# Patient Record
Sex: Male | Born: 1955 | Race: White | Hispanic: No | Smoking: Former smoker
Health system: Southern US, Community
[De-identification: ages and names within clinical notes are randomized; demographics above are authoritative.]

## PROBLEM LIST (undated history)

## (undated) DIAGNOSIS — R972 Elevated prostate specific antigen [PSA]: Secondary | ICD-10-CM

## (undated) DIAGNOSIS — Z9889 Other specified postprocedural states: Secondary | ICD-10-CM

## (undated) DIAGNOSIS — I219 Acute myocardial infarction, unspecified: Secondary | ICD-10-CM

## (undated) DIAGNOSIS — Z87442 Personal history of urinary calculi: Secondary | ICD-10-CM

## (undated) DIAGNOSIS — D72829 Elevated white blood cell count, unspecified: Secondary | ICD-10-CM

## (undated) DIAGNOSIS — F411 Generalized anxiety disorder: Secondary | ICD-10-CM

## (undated) DIAGNOSIS — E291 Testicular hypofunction: Secondary | ICD-10-CM

## (undated) DIAGNOSIS — M1990 Unspecified osteoarthritis, unspecified site: Secondary | ICD-10-CM

## (undated) DIAGNOSIS — T8859XA Other complications of anesthesia, initial encounter: Secondary | ICD-10-CM

## (undated) DIAGNOSIS — F329 Major depressive disorder, single episode, unspecified: Secondary | ICD-10-CM

## (undated) DIAGNOSIS — J189 Pneumonia, unspecified organism: Secondary | ICD-10-CM

## (undated) DIAGNOSIS — I319 Disease of pericardium, unspecified: Secondary | ICD-10-CM

## (undated) DIAGNOSIS — N401 Enlarged prostate with lower urinary tract symptoms: Secondary | ICD-10-CM

## (undated) DIAGNOSIS — C775 Secondary and unspecified malignant neoplasm of intrapelvic lymph nodes: Secondary | ICD-10-CM

## (undated) DIAGNOSIS — T4145XA Adverse effect of unspecified anesthetic, initial encounter: Secondary | ICD-10-CM

## (undated) DIAGNOSIS — I10 Essential (primary) hypertension: Secondary | ICD-10-CM

## (undated) DIAGNOSIS — I2510 Atherosclerotic heart disease of native coronary artery without angina pectoris: Secondary | ICD-10-CM

## (undated) DIAGNOSIS — E785 Hyperlipidemia, unspecified: Secondary | ICD-10-CM

## (undated) DIAGNOSIS — R351 Nocturia: Secondary | ICD-10-CM

## (undated) DIAGNOSIS — N486 Induration penis plastica: Secondary | ICD-10-CM

## (undated) DIAGNOSIS — H8110 Benign paroxysmal vertigo, unspecified ear: Secondary | ICD-10-CM

## (undated) DIAGNOSIS — R42 Dizziness and giddiness: Secondary | ICD-10-CM

## (undated) DIAGNOSIS — N400 Enlarged prostate without lower urinary tract symptoms: Secondary | ICD-10-CM

## (undated) DIAGNOSIS — G629 Polyneuropathy, unspecified: Secondary | ICD-10-CM

## (undated) DIAGNOSIS — N403 Nodular prostate with lower urinary tract symptoms: Secondary | ICD-10-CM

## (undated) DIAGNOSIS — F419 Anxiety disorder, unspecified: Secondary | ICD-10-CM

## (undated) DIAGNOSIS — G709 Myoneural disorder, unspecified: Secondary | ICD-10-CM

## (undated) DIAGNOSIS — Z8489 Family history of other specified conditions: Secondary | ICD-10-CM

## (undated) DIAGNOSIS — N529 Male erectile dysfunction, unspecified: Secondary | ICD-10-CM

## (undated) DIAGNOSIS — R112 Nausea with vomiting, unspecified: Secondary | ICD-10-CM

## (undated) DIAGNOSIS — E119 Type 2 diabetes mellitus without complications: Secondary | ICD-10-CM

## (undated) HISTORY — PX: PROSTATE BIOPSY: SHX241

## (undated) HISTORY — DX: Disease of pericardium, unspecified: I31.9

## (undated) HISTORY — DX: Essential (primary) hypertension: I10

## (undated) HISTORY — DX: Benign prostatic hyperplasia without lower urinary tract symptoms: N40.0

## (undated) HISTORY — PX: CARPAL TUNNEL RELEASE: SHX101

## (undated) HISTORY — PX: JOINT REPLACEMENT: SHX530

## (undated) HISTORY — DX: Testicular hypofunction: E29.1

## (undated) HISTORY — PX: BACK SURGERY: SHX140

## (undated) HISTORY — DX: Elevated white blood cell count, unspecified: D72.829

## (undated) HISTORY — DX: Elevated prostate specific antigen (PSA): R97.20

## (undated) HISTORY — PX: SHOULDER ARTHROSCOPY DISTAL CLAVICLE EXCISION AND OPEN ROTATOR CUFF REPAIR: SHX2396

---

## 1989-01-20 HISTORY — PX: CARPAL TUNNEL RELEASE: SHX101

## 1998-01-08 ENCOUNTER — Ambulatory Visit (HOSPITAL_BASED_OUTPATIENT_CLINIC_OR_DEPARTMENT_OTHER): Admission: RE | Admit: 1998-01-08 | Discharge: 1998-01-08 | Payer: Self-pay | Admitting: *Deleted

## 2000-01-21 HISTORY — PX: SHOULDER ARTHROSCOPY W/ ROTATOR CUFF REPAIR: SHX2400

## 2000-07-17 ENCOUNTER — Ambulatory Visit (HOSPITAL_COMMUNITY): Admission: RE | Admit: 2000-07-17 | Discharge: 2000-07-17 | Payer: Self-pay | Admitting: Specialist

## 2000-07-17 ENCOUNTER — Encounter: Payer: Self-pay | Admitting: Specialist

## 2003-01-10 ENCOUNTER — Encounter (INDEPENDENT_AMBULATORY_CARE_PROVIDER_SITE_OTHER): Payer: Self-pay | Admitting: Cardiology

## 2003-01-10 ENCOUNTER — Inpatient Hospital Stay (HOSPITAL_COMMUNITY): Admission: EM | Admit: 2003-01-10 | Discharge: 2003-01-14 | Payer: Self-pay | Admitting: Emergency Medicine

## 2006-01-20 HISTORY — PX: TOTAL HIP ARTHROPLASTY: SHX124

## 2006-09-29 ENCOUNTER — Encounter: Admission: RE | Admit: 2006-09-29 | Discharge: 2006-09-29 | Payer: Self-pay | Admitting: Orthopaedic Surgery

## 2009-01-20 DIAGNOSIS — I319 Disease of pericardium, unspecified: Secondary | ICD-10-CM

## 2009-01-20 DIAGNOSIS — I2510 Atherosclerotic heart disease of native coronary artery without angina pectoris: Secondary | ICD-10-CM

## 2009-01-20 DIAGNOSIS — I219 Acute myocardial infarction, unspecified: Secondary | ICD-10-CM

## 2009-01-20 HISTORY — DX: Disease of pericardium, unspecified: I31.9

## 2009-01-20 HISTORY — DX: Atherosclerotic heart disease of native coronary artery without angina pectoris: I25.10

## 2009-01-20 HISTORY — DX: Acute myocardial infarction, unspecified: I21.9

## 2009-02-20 HISTORY — PX: CARDIAC CATHETERIZATION: SHX172

## 2009-03-18 ENCOUNTER — Inpatient Hospital Stay (HOSPITAL_COMMUNITY): Admission: EM | Admit: 2009-03-18 | Discharge: 2009-03-20 | Payer: Self-pay | Admitting: Cardiovascular Disease

## 2009-03-18 ENCOUNTER — Ambulatory Visit: Payer: Self-pay | Admitting: Internal Medicine

## 2009-03-18 ENCOUNTER — Encounter: Payer: Self-pay | Admitting: Emergency Medicine

## 2009-03-18 DIAGNOSIS — I300 Acute nonspecific idiopathic pericarditis: Secondary | ICD-10-CM | POA: Insufficient documentation

## 2009-03-18 DIAGNOSIS — Z8679 Personal history of other diseases of the circulatory system: Secondary | ICD-10-CM

## 2009-03-18 DIAGNOSIS — I2510 Atherosclerotic heart disease of native coronary artery without angina pectoris: Secondary | ICD-10-CM

## 2009-03-18 HISTORY — PX: CARDIAC CATHETERIZATION: SHX172

## 2009-03-18 HISTORY — DX: Personal history of other diseases of the circulatory system: Z86.79

## 2009-03-18 HISTORY — DX: Atherosclerotic heart disease of native coronary artery without angina pectoris: I25.10

## 2009-03-19 ENCOUNTER — Encounter: Payer: Self-pay | Admitting: Cardiovascular Disease

## 2009-03-27 DIAGNOSIS — I2510 Atherosclerotic heart disease of native coronary artery without angina pectoris: Secondary | ICD-10-CM | POA: Insufficient documentation

## 2009-03-27 DIAGNOSIS — E785 Hyperlipidemia, unspecified: Secondary | ICD-10-CM | POA: Insufficient documentation

## 2009-03-27 DIAGNOSIS — I10 Essential (primary) hypertension: Secondary | ICD-10-CM | POA: Insufficient documentation

## 2009-03-27 DIAGNOSIS — Z87891 Personal history of nicotine dependence: Secondary | ICD-10-CM | POA: Insufficient documentation

## 2009-03-27 DIAGNOSIS — Z8659 Personal history of other mental and behavioral disorders: Secondary | ICD-10-CM | POA: Insufficient documentation

## 2009-03-30 ENCOUNTER — Ambulatory Visit: Payer: Self-pay | Admitting: Cardiovascular Disease

## 2009-04-03 ENCOUNTER — Encounter: Payer: Self-pay | Admitting: Cardiovascular Disease

## 2009-04-19 ENCOUNTER — Ambulatory Visit: Payer: Self-pay | Admitting: Cardiovascular Disease

## 2009-12-10 ENCOUNTER — Ambulatory Visit: Payer: Self-pay | Admitting: Oncology

## 2009-12-20 ENCOUNTER — Other Ambulatory Visit
Admission: RE | Admit: 2009-12-20 | Discharge: 2009-12-20 | Payer: Self-pay | Source: Home / Self Care | Admitting: Oncology

## 2010-02-19 NOTE — Letter (Signed)
Summary: Limited Wage Continuation Plan  Limited Wage Continuation Plan   Imported By: Marylou Mccoy 06/08/2009 16:39:03  _____________________________________________________________________  External Attachment:    Type:   Image     Comment:   External Document

## 2010-02-19 NOTE — Letter (Signed)
Summary: Return To Work  [employer], Main Office  [address]   [place], [place] [address]   Phone: [phone]  Fax: [phone]    04/19/2009  TO: Leodis Sias IT MAY CONCERN   RE: Sean Green [address] [place]   The above named individual is under my medical care and may return to work on: April 20, 2009 with no restrictions.   If you have any further questions or need additional information, please call.     Sincerely,    Veverly Fells. Excell Seltzer, MD

## 2010-02-19 NOTE — Assessment & Plan Note (Signed)
Summary: Sean Green   Visit Type:  Post-hospital Primary Provider:  Dr Laurine Blazer  CC:  chest tenderness- nausea.  History of Present Illness: This is a 55 year-old gentleman who was recently hospitalized with a febrile illness and severe GI symptoms. During his eval in the Emergency Dept, he developed chest pain and cardiac markers were positive. He had ST elevation on EKG and underwent emergent cardiac cath. This showed only minor nonobstructive CAD. His clinical syndrome evolved and it became clear that he had acute pericarditis, presmably viral and related to his acute illness. He developed supine, pleuritic, sharp chest pains and typical EKG changes of pericarditis.  He has now been reated with Indocin and Colchicine, and reports improvement in overall symptoms. He continues to have a cough and intermittent nausea. States his chest still feels 'sore.' No exertional symptoms.  Current Medications (verified): 1)  Aspirin 81 Mg Tbec (Aspirin) .... Take One Tablet By Mouth Daily 2)  Acetaminophen 325 Mg Tabs (Acetaminophen) .... As Needed 3)  Colchicine 0.6 Mg Tabs (Colchicine) .... Take 1 Tablet By Mouth Two Times A Day 4)  Pantoprazole Sodium 40 Mg Tbec (Pantoprazole Sodium) .... Take 1 Tablet By Mouth Once A Day 5)  Lisinopril-Hydrochlorothiazide 20-25 Mg Tabs (Lisinopril-Hydrochlorothiazide) .... Take 1 Tablet By Mouth Once A Day 6)  Amlodipine Besylate 5 Mg Tabs (Amlodipine Besylate) .... Take One Tablet By Mouth Daily 7)  Citalopram Hydrobromide 40 Mg Tabs (Citalopram Hydrobromide) .... Take 1 Tablet By Mouth Once A Day 8)  Indocin 50 Mg Supp (Indomethacin) .... Three Times A Day 9)  Oxycodone-Acetaminophen 5-325 Mg Tabs (Oxycodone-Acetaminophen) .... As Needed 10)  Fiber 625 Mg Tabs (Calcium Polycarbophil) .... Take 1 Tablet By Mouth Two Times A Day 11)  Melatonin 3 Mg Tabs (Melatonin) .... Take 1 Tablet By Mouth Two Times A Day  Allergies (verified): No Known Drug Allergies  Past  History:  Past medical history reviewed for relevance to current acute and chronic problems.  Past Medical History: Acute Pericarditis 2011 CORONARY ARTERY DISEASE (ICD-414.00)-  right coronary artery 20%, distal left anterior descending   bridging, and but no obstructive coronary artery disease.  HYPERTENSION, UNSPECIFIED (ICD-401.9) DYSLIPIDEMIA (ICD-272.4) DEPRESSION, HX OF (ICD-V11.8) TOBACCO ABUSE, HX OF (ICD-V15.82)  Social History:  The patient denies any tobacco, alcohol, or drug use.  works as a Development worker, [employer]       Negative except as per HPI   Vital Signs:  Patient profile:   55 year old male Height:      74 inches Weight:      271.25 pounds BMI:     34.95 Pulse rate:   88 / minute Pulse rhythm:   irregular Resp:     18 per minute BP sitting:   124 / 88  (left arm) Cuff size:   large  Vitals Entered By: Vikki Ports (March 30, 2009 11:41 AM)  Physical Exam  General:  Pt is alert and oriented, in no acute distress. HEENT: normal Neck: normal carotid upstrokes without bruits, JVP normal Lungs: CTA CV: RRR without murmur or friction rub Abd: soft, NT, positive BS, no bruit, no organomegaly Ext: no clubbing, cyanosis, or edema. peripheral pulses 2+ and equal Skin: warm and dry without rash    EKG  Procedure date:  03/30/2009  Findings:      NSR, HR 85 bpm, PVC's, diffuse Twave abnormality consider anterolateral and inferior ischemia (consistent with evolving changes related to his recent pericarditis)  Impression &  Recommendations:  Problem # 1:  ACUTE IDIOPATHIC PERICARDITIS (ICD-420.91) Pt improving but remains symptomatic. Will decrease his Indocin to 50 mg two times a day to reduce GI toxicity and plan on continuing for another 2 weeks. Also would continue colchicine for now. If symptoms resolved in 2 weeks, will discontinue meds at that time. Continue empiric PPI while on indocin. I have recommended he remain out of work until he  returns for reevaluation in 2 wks.  His updated medication list for this problem includes:    Lisinopril-hydrochlorothiazide 20-25 Mg Tabs (Lisinopril-hydrochlorothiazide) .Marland Kitchen... Take 1 tablet by mouth once a day    Indocin 50 Mg Supp (Indomethacin) .Marland Kitchen... Take two  times a day  Problem # 2:  CORONARY ARTERY DISEASE (ICD-414.00) Nonobstructive. Continue current Rx.  His updated medication list for this problem includes:    Aspirin 81 Mg Tbec (Aspirin) .Marland Kitchen... Take one tablet by mouth daily    Lisinopril-hydrochlorothiazide 20-25 Mg Tabs (Lisinopril-hydrochlorothiazide) .Marland Kitchen... Take 1 tablet by mouth once a day    Amlodipine Besylate 5 Mg Tabs (Amlodipine besylate) .Marland Kitchen... Take one tablet by mouth daily  Orders: EKG w/ Interpretation (93000)  Problem # 3:  HYPERTENSION, UNSPECIFIED (ICD-401.9) BP controlled.  His updated medication list for this problem includes:    Aspirin 81 Mg Tbec (Aspirin) .Marland Kitchen... Take one tablet by mouth daily    Lisinopril-hydrochlorothiazide 20-25 Mg Tabs (Lisinopril-hydrochlorothiazide) .Marland Kitchen... Take 1 tablet by mouth once a day    Amlodipine Besylate 5 Mg Tabs (Amlodipine besylate) .Marland Kitchen... Take one tablet by mouth daily  Orders: EKG w/ Interpretation (93000)  BP today: 124/88  Patient Instructions: 1)  Your physician recommends that you schedule a follow-up appointment in: 2 WEEKS 2)  Your physician has recommended you make the following change in your medication: DECREASE Indocin to two times a day

## 2010-02-19 NOTE — Assessment & Plan Note (Signed)
Summary: f2w   Visit Type:  2 weeks follow up Primary Provider:  Dr Laurine Blazer  CC:  No cardiac complains.  History of Present Illness: This is a 55 year-old gentleman presenting for follow-up of pericarditis. His chest pain has now resolved and he continues on a combination of colchicine and indomethacin. No dyspnea, edema, or other complaints.    Current Medications (verified): 1)  Aspirin 81 Mg Tbec (Aspirin) .... Take One Tablet By Mouth Daily 2)  Acetaminophen 325 Mg Tabs (Acetaminophen) .... As Needed 3)  Colchicine 0.6 Mg Tabs (Colchicine) .... Take 1 Tablet By Mouth Two Times A Day 4)  Pantoprazole Sodium 40 Mg Tbec (Pantoprazole Sodium) .... Take 1 Tablet By Mouth Once A Day 5)  Lisinopril 2.5 Mg Tabs (Lisinopril) .... Take One Tablet By Mouth Daily 6)  Amlodipine Besylate 5 Mg Tabs (Amlodipine Besylate) .... Take One Tablet By Mouth Daily 7)  Citalopram Hydrobromide 40 Mg Tabs (Citalopram Hydrobromide) .... Take 1 Tablet By Mouth Once A Day 8)  Indocin 50 Mg Supp (Indomethacin) .... Take Two  Times A Day 9)  Oxycodone-Acetaminophen 5-325 Mg Tabs (Oxycodone-Acetaminophen) .... As Needed 10)  Fiber 625 Mg Tabs (Calcium Polycarbophil) .... Take 1 Tablet By Mouth Two Times A Day 11)  Melatonin 3 Mg Tabs (Melatonin) .... Take 1 Tablet By Mouth Two Times A Day  Allergies (verified): No Known Drug Allergies  Past History:  Past medical history reviewed for relevance to current acute and chronic problems.  Past Medical History: Reviewed history from 03/30/2009 and no changes required. Acute Pericarditis 2011 CORONARY ARTERY DISEASE (ICD-414.00)-  right coronary artery 20%, distal left anterior descending   bridging, and but no obstructive coronary artery disease.  HYPERTENSION, UNSPECIFIED (ICD-401.9) DYSLIPIDEMIA (ICD-272.4) DEPRESSION, HX OF (ICD-V11.8) TOBACCO ABUSE, HX OF (ICD-V15.82)  Vital Signs:  Patient profile:   55 year old male Height:      74 inches Weight:       274.25 pounds BMI:     35.34 Pulse rate:   93 / minute Pulse rhythm:   regular Resp:     18 per minute BP sitting:   132 / 94  (left arm) Cuff size:   large  Vitals Entered By: Vikki Ports (April 19, 2009 11:58 AM)  Physical Exam  General:  Pt is alert and oriented, in no acute distress. HEENT: normal Neck: normal carotid upstrokes without bruits, JVP normal Lungs: CTA CV: RRR without murmur or gallop. no friction rub. Abd: soft, NT, positive BS, no bruit, no organomegaly Ext: no clubbing, cyanosis, or edema. peripheral pulses 2+ and equal Skin: warm and dry without rash    EKG  Procedure date:  04/19/2009  Findings:      NSR, Twave abnormality consider anterlateral ischemia, HR 93 bpm.  Impression & Recommendations:  Problem # 1:  ACUTE IDIOPATHIC PERICARDITIS (ICD-420.91) Pt now with resolution of his symptoms. Discontinue colchicine and indocin. Pt may return to work. Follow-up 2 months.  The following medications were removed from the medication list:    Indocin 50 Mg Supp (Indomethacin) .Marland Kitchen... Take two  times a day His updated medication list for this problem includes:    Lisinopril 2.5 Mg Tabs (Lisinopril) .Marland Kitchen... Take one tablet by mouth daily  Orders: EKG w/ Interpretation ([address])  Problem # 2:  HYPERTENSION, UNSPECIFIED (ICD-401.9) BP mildly elevated...was ok last visit. f/u 2 months...no med changes today.  His updated medication list for this problem includes:    Aspirin 81 Mg Tbec (Aspirin) .Marland KitchenMarland KitchenMarland KitchenMarland Kitchen  Take one tablet by mouth daily    Lisinopril 2.5 Mg Tabs (Lisinopril) .Marland Kitchen... Take one tablet by mouth daily    Amlodipine Besylate 5 Mg Tabs (Amlodipine besylate) .Marland Kitchen... Take one tablet by mouth daily  Orders: EKG w/ Interpretation (93000)  BP today: 132/94 Prior BP: 124/88 (03/30/2009)  Patient Instructions: 1)  Your physician recommends that you schedule a follow-up appointment in: 2 MONTHS with as EKG 2)  Your physician has recommended you make the  following change in your medication: STOP Colchicine and Indocin 3)  Your physician deems you medically cleared to return to work.  A return to work note was provided today.

## 2010-02-19 NOTE — Letter (Signed)
Summary: [employer], Main Office  [address]   [place], [place] [address]   Phone: [phone]  Fax: [phone]     March 30, 2009    Sean Green   The above named patient had a medical visit today.  This patient should remain out of work until his next scheduled appointment on April 19, 2009.   Please take this into consideration when reviewing the time away from work.      Sincerely yours,   Dr Tonny Bollman [facility]

## 2010-03-21 ENCOUNTER — Encounter (HOSPITAL_BASED_OUTPATIENT_CLINIC_OR_DEPARTMENT_OTHER): Payer: 59 | Admitting: Oncology

## 2010-03-21 ENCOUNTER — Other Ambulatory Visit: Payer: Self-pay | Admitting: Oncology

## 2010-03-21 DIAGNOSIS — D72829 Elevated white blood cell count, unspecified: Secondary | ICD-10-CM

## 2010-03-21 LAB — CBC WITH DIFFERENTIAL/PLATELET
BASO%: 0.3 % (ref 0.0–2.0)
Basophils Absolute: 0 10*3/uL (ref 0.0–0.1)
EOS%: 3 % (ref 0.0–7.0)
Eosinophils Absolute: 0.3 10*3/uL (ref 0.0–0.5)
HCT: 46.3 % (ref 38.4–49.9)
HGB: 15.8 g/dL (ref 13.0–17.1)
LYMPH%: 27.8 % (ref 14.0–49.0)
MCH: 30.1 pg (ref 27.2–33.4)
MCHC: 34.2 g/dL (ref 32.0–36.0)
MCV: 88.1 fL (ref 79.3–98.0)
MONO#: 1 10*3/uL — ABNORMAL HIGH (ref 0.1–0.9)
MONO%: 9.2 % (ref 0.0–14.0)
NEUT#: 6.5 10*3/uL (ref 1.5–6.5)
NEUT%: 59.7 % (ref 39.0–75.0)
Platelets: 284 10*3/uL (ref 140–400)
RBC: 5.25 10*6/uL (ref 4.20–5.82)
RDW: 13.6 % (ref 11.0–14.6)
WBC: 10.9 10*3/uL — ABNORMAL HIGH (ref 4.0–10.3)
lymph#: 3 10*3/uL (ref 0.9–3.3)

## 2010-04-10 LAB — BASIC METABOLIC PANEL
BUN: 6 mg/dL (ref 6–23)
CO2: 26 mEq/L (ref 19–32)
Calcium: 7.6 mg/dL — ABNORMAL LOW (ref 8.4–10.5)
Chloride: 102 mEq/L (ref 96–112)
Creatinine, Ser: 0.81 mg/dL (ref 0.4–1.5)
GFR calc Af Amer: >60 mL/min (ref >60)
GFR calc non Af Amer: >60 mL/min (ref >60)
Glucose, Bld: 109 mg/dL — ABNORMAL HIGH (ref 70–99)
Potassium: 3.5 mEq/L (ref 3.5–5.1)
Sodium: 137 mEq/L (ref 135–145)

## 2010-04-10 LAB — CBC
HCT: 42 % (ref 39.0–52.0)
Hemoglobin: 14.2 g/dL (ref 13.0–17.0)
MCHC: 34 g/dL (ref 30.0–36.0)
MCV: 91.1 fL (ref 78.0–100.0)
Platelets: 191 10*3/uL (ref 150–400)
RBC: 4.61 MIL/uL (ref 4.22–5.81)
RDW: 14.4 % (ref 11.5–15.5)
WBC: 17.6 10*3/uL — ABNORMAL HIGH (ref 4.0–10.5)

## 2010-04-10 LAB — TSH: TSH: 0.555 u[IU]/mL (ref 0.350–4.500)

## 2010-04-10 LAB — CK TOTAL AND CKMB (NOT AT ARMC)
CK, MB: 96.6 ng/mL (ref 0.3–4.0)
Relative Index: 9.6 — ABNORMAL HIGH (ref 0.0–2.5)
Total CK: 1008 U/L — ABNORMAL HIGH (ref 7–232)

## 2010-04-10 LAB — LIPID PANEL
Cholesterol: 80 mg/dL (ref 0–200)
HDL: 29 mg/dL — ABNORMAL LOW (ref >39)
LDL Cholesterol: 43 mg/dL (ref 0–99)
Total CHOL/HDL Ratio: 2.8 RATIO
Triglycerides: 38 mg/dL (ref <150)
VLDL: 8 mg/dL (ref 0–40)

## 2010-04-10 LAB — TROPONIN I: Troponin I: 26.15 ng/mL (ref 0.00–0.06)

## 2010-04-10 LAB — MRSA PCR SCREENING: MRSA by PCR: NEGATIVE

## 2010-04-11 LAB — URINALYSIS, ROUTINE W REFLEX MICROSCOPIC
Bilirubin Urine: NEGATIVE
Glucose, UA: NEGATIVE mg/dL
Ketones, ur: 15 mg/dL — AB
Leukocytes, UA: NEGATIVE
Nitrite: NEGATIVE
Protein, ur: 30 mg/dL — AB
Specific Gravity, Urine: 1.016 (ref 1.005–1.030)
Urobilinogen, UA: 0.2 mg/dL (ref 0.0–1.0)
pH: 6 (ref 5.0–8.0)

## 2010-04-11 LAB — BASIC METABOLIC PANEL
BUN: 13 mg/dL (ref 6–23)
CO2: 26 mEq/L (ref 19–32)
Calcium: 8.7 mg/dL (ref 8.4–10.5)
Chloride: 101 mEq/L (ref 96–112)
Creatinine, Ser: 1.09 mg/dL (ref 0.4–1.5)
GFR calc Af Amer: >60 mL/min (ref >60)
GFR calc non Af Amer: >60 mL/min (ref >60)
Glucose, Bld: 119 mg/dL — ABNORMAL HIGH (ref 70–99)
Potassium: 3.6 mEq/L (ref 3.5–5.1)
Sodium: 137 mEq/L (ref 135–145)

## 2010-04-11 LAB — URINE MICROSCOPIC-ADD ON

## 2010-04-11 LAB — CBC
HCT: 47.1 % (ref 39.0–52.0)
Hemoglobin: 15.6 g/dL (ref 13.0–17.0)
MCHC: 33 g/dL (ref 30.0–36.0)
MCV: 89.4 fL (ref 78.0–100.0)
Platelets: 224 10*3/uL (ref 150–400)
RBC: 5.27 MIL/uL (ref 4.22–5.81)
RDW: 14 % (ref 11.5–15.5)
WBC: 18.5 10*3/uL — ABNORMAL HIGH (ref 4.0–10.5)

## 2010-04-11 LAB — POCT CARDIAC MARKERS
CKMB, poc: 23.9 ng/mL (ref 1.0–8.0)
Myoglobin, poc: 226 ng/mL (ref 12–200)
Troponin i, poc: 3.91 ng/mL (ref 0.00–0.09)

## 2010-04-11 LAB — DIFFERENTIAL
Basophils Absolute: 0 10*3/uL (ref 0.0–0.1)
Basophils Relative: 0 % (ref 0–1)
Eosinophils Absolute: 0 10*3/uL (ref 0.0–0.7)
Eosinophils Relative: 0 % (ref 0–5)
Lymphocytes Relative: 7 % — ABNORMAL LOW (ref 12–46)
Lymphs Abs: 1.4 10*3/uL (ref 0.7–4.0)
Monocytes Absolute: 2 10*3/uL — ABNORMAL HIGH (ref 0.1–1.0)
Monocytes Relative: 11 % (ref 3–12)
Neutro Abs: 15.1 10*3/uL — ABNORMAL HIGH (ref 1.7–7.7)
Neutrophils Relative %: 82 % — ABNORMAL HIGH (ref 43–77)

## 2010-06-07 NOTE — Discharge Summary (Signed)
NAME:  Sean Green, Sean Green                            ACCOUNT NO.:  [id number]   MEDICAL RECORD NO.:  [id number]                   PATIENT TYPE:  INP   LOCATION:  5025                                 FACILITY:  [facility]   PHYSICIAN:  Deirdre Peer. Polite, M.D.              DATE OF BIRTH:  [birth year]   DATE OF ADMISSION:  01/10/2003  DATE OF DISCHARGE:  01/14/2003                                 DISCHARGE SUMMARY   DISCHARGE DIAGNOSES:  1. Right lower lobe pneumonia.  2. Elevated right hemidiaphragm.  3. Pleurisy.  4. Leukocytosis.  5. Chest pains.  6. Evaluation for ischemia.   DISCHARGE MEDICATIONS:  1. Avelox 1 p.o. q.d. x10 days.  2. Motrin 400 mg p.o. q.6h.  3. Protonix 20 mg q.d.  4. Percocet 1-2 tabs as needed for pain.   DISPOSITION:  Patient was discharged to home in stable condition, asked to  follow up with his primary MD in two weeks.  Also requested to have a  followup chest x-ray in three weeks and to follow up with a CBC.   HISTORY OF PRESENT ILLNESS:  Patient is a 55 year old male with no history  of tobacco use, hypertension, depression, who presented to the ED with the  complaint of substernal chest pain.  Patient was admitted by [facility]  [facility], where he had negative cardiac enzymes and EKG without acute  abnormality and a 2-D echo with no wall motion abnormality.  No further  cardiac evaluation was undertaken.  However, during the hospitalization, the  patient continued to complain of chest pain.  X-ray showed significant  effusion.  Patient had a CT of the chest, which was negative for PE and  actually was negative for effusion.  It really showed elevated right  hemidiaphragm plus or minus an infiltrate.  [facility] hospitalist was consulted  for further evaluation and treatment.  Please see dictated consultation for  further details and history of present illness.   HOSPITAL COURSE:  As stated, the patient was initially admitted under  cardiology service for evaluation  of chest pain.  This evaluation was  negative.  [facility] hospitalist was consulted for further evaluation and  management, secondary to probable right pleural effusion and significant  leukocytosis.  As stated, the patient ultimately had a CAT scan of his  chest, which really showed elevated right hemidiaphragm and not a  significant effusion.  In the interim, Pulmonary also saw the patient and  agreed with treatment for empiric antibiotics for pneumonia and pleurisy.  The patient's white count began to drift down, and fever defervesced.  His  hospitalization was one of continued improvement from that point.  Patient  was clear from a cardiac as well as clear from a pulmonary  standpoint for discharge.  Patient was discharged on December 25, asked to  complete a total of 10-day course of antibiotics and analgesia as needed.  Follow up with  primary MD in two to three weeks as well as followup chest x-  ray.                                                Deirdre Peer. Polite, M.D.    RDP/MEDQ  D:  04/26/2003  T:  04/27/2003  Job:  [id number]

## 2010-06-07 NOTE — Consult Note (Signed)
NAME:  Sean Green, Sean Green                            ACCOUNT NO.:  [id number]   MEDICAL RECORD NO.:  [id number]                   PATIENT TYPE:  INP   LOCATION:  2039                                 FACILITY:  [facility]   PHYSICIAN:  Deirdre Peer. Polite, M.D.              DATE OF BIRTH:  05/27/1955   DATE OF CONSULTATION:  DATE OF DISCHARGE:                                   CONSULTATION   CHIEF COMPLAINT:  Chest pain.   HISTORY OF PRESENT ILLNESS:  A 55 year old obese male with a known history  of tobacco use, hypertension, and depression who presented to the ED with  complaint of substernal chest pain.  The patient was seen on December 21 by  [facility], and he was admitted for evaluation for possible cardiac  cause.  The patient had negative cardiac enzymes x3 and EKG without acute  abnormality.  The patient also had a 2 D echo which showed EF of 55-65%, no  wall motion abnormalities.  In the interim the patient had high fevers and  abnormal x-ray suggestive of infiltrate versus effusion in the right lower  lobe.  The patient also had CT scan which was negative for PE, DVT, or any  other acute abnormality.  Follow-up x-rays were ordered, bilateral decubiti,  which showed a minimal right effusion, no left effusion, and a questionable  paralyzed right hemidiaphragm.  Because of the above finding, [facility] was consulted for further assistance in treatment of this  patient.  At the time of my evaluation the patient is complaining of chest  pain of a pleuritic nature, worse by deep breath, also reproducible by  palpation.  The patient was given sublingual nitroglycerin without relief  but had good relief with IV morphine.  The patient has had stat blood work,  which is pending at this time.  Review of his chest x-ray, as stated above,  shows paralyzed right hemidiaphragm, minimal right pleural effusion,  cardiomegaly.  The patient is alert and oriented and hemodynamically stable.   PAST MEDICAL HISTORY:  1. As stated above.  2. Hypertension.  3. Depression.   MEDICATIONS:  Altace and Celexa.  He is unsure of his doses.   ALLERGIES:  No known drug allergies.   SOCIAL HISTORY:  Positive for tobacco, about five cigarettes a day for  approximately 20 years, social alcohol, denies any drugs.   FAMILY HISTORY:  States that mother had history of hypertension.  She is  deceased from abdominal aortic dissection.  Also states that his father is  deceased, had hypertension and diabetes.   REVIEW OF SYSTEMS:  As stated in initial HPI.   PHYSICAL EXAMINATION:  GENERAL:  At the time the patient is in moderate  distress secondary to chest pain.  VITAL SIGNS:  He is saturating 98-100% on 2 L, pulse 93, BP 136/90.  On the  monitor, has been in normal  sinus rhythm with a rate between 98 and 105.  Temperature 101.  HEENT:  Pupils equal, and reactive to light.  Anicteric sclerae.  No oral  lesions.  NECK:  No nodes, no JVD.  CHEST:  Clear to auscultation on the left, decreased breath sounds on the  right.  CARDIOVASCULAR:  Regular S1, S2, no S3 is appreciated.  No rub is  appreciated.  However, the patient does express relief from his pain by  sitting and leaning forward.  ABDOMEN:  Obese, nontender.  EXTREMITIES:  No edema.  NEUROLOGIC:  Nonfocal.   LABORATORY DATA:  CK x3 negative.  ABG on December 22, pH 7.4, PCO2 of 37.4,  PO2 of 58.2.  CBC:  White count 27.8, hemoglobin 13.8, hematocrit 40,  platelets 327.  BMET within normal limits.  Total cholesterol 118, HDL 38,  LDL 72.  UA within normal limits.  Influenza antigen negative.  Legionella  urine antigen negative.  EKG:  Normal sinus rhythm, minimal voltage criteria  for LVH.  CT of the chest with no evidence for aortic dissection, no  evidence for pulmonary embolism.  Lungs were clear.  CT scan of the abdomen:  No pathologic findings.  Chest x-ray:  Marked elevation of the right  hemidiaphragm with increased  markings at the right base, atelectasis,  infiltrate, and effusion are not excluded.  Sinus x-ray:  No evidence of  paranasal sinus disease.  Bilateral decubitus x-ray:  No significant left  pleural effusion.  Right lateral decubitus shows no significant mobile  effusion.  The possibility of a minimal effusion is suggested.  Mild  cardiomegaly.   ASSESSMENT AND PLAN:  1. Chest pain.  Of note, the patient has no relief with nitroglycerin, his     EKG without acute changes.  CKs are negative for ischemia x3.  However,     he does express some relief on sitting forward and receiving morphine,     therefore raising concern for pleurisy.  2. Small right pleural effusion, insignificant.  3. Elevated right hemidiaphragm.  Patient unsure if this is new or not.     Will check the old x-rays on an outpatient basis, further evaluation as     indicated.  4. Leukocytosis.  5. History of tobacco use x20 years.  6. Depression.  7. Obesity.  8. Hypertension.  9. Hypoxia by arterial blood gas.  Of note, CT is negative for pulmonary     embolus, and must be concerned for possible chronic obstructive pulmonary     disease or obstructive sleep apnea.   RECOMMENDATIONS:  1. Obtain 2 D echo, which has been done.  2. Follow serial EKGs.  3. Rule out rheumatologic cause for the above findings.  Check a     sedimentation rate and ANA.  4. Will continue antibiotics for possible bronchitis versus pneumonia and     will try to obtain outpatient x-ray to see if elevated hemidiaphragm has     been diagnosed in the past.  If not, further evaluation.   Will make further recommendations after review of the above studies.                                               Deirdre Peer. Polite, M.D.    RDP/MEDQ  D:  01/12/2003  T:  01/12/2003  Job:  [id number]   cc:  Oley Balm Georgina Pillion, M.D.  [address]  [place]  [place] [address]  Fax: [phone]

## 2010-06-07 NOTE — Consult Note (Signed)
NAME:  Sean Green, Sean Green                            ACCOUNT NO.:  [id number]   MEDICAL RECORD NO.:  [id number]                   PATIENT TYPE:  INP   LOCATION:  2039                                 FACILITY:  [facility]   PHYSICIAN:  Shan Levans, M.D. [facility]            DATE OF BIRTH:  09-Jan-1956   DATE OF CONSULTATION:  01/11/2003  DATE OF DISCHARGE:                                   CONSULTATION   REPORT TITLE:  PULMONOLOGY CONSULTATION   CONSULTING PHYSICIAN:  Charlcie Cradle. Delford Field, M.D. [facility]   REFERRING PHYSICIAN:  Traci M. Mayford Knife, M.D.   CHIEF COMPLAINT:  Pleuritic chest pain, fever, leukocytosis.   HISTORY OF PRESENT ILLNESS:  This is a 55 year old white male, admitted on  January 10, 2003 to the Cardiology service.  He had the onset, two hours  prior to admission, of substernal chest pain with pressure, no radiation,  mild nausea, some cold sweats, a small amount of emesis.  He had just  finished the night shift at [employer] and came home to go to bed and began  having the chest discomfort.  He does have an underlying history of  hypertension, also depression, no previous history of known coronary  disease.  He took over-the-counter Tagamet and antacid without change in the  pain and the pain continued.  Pain now has rotated over to the right lateral  chest wall and is more pleuritic in nature.  Chest x-ray shows lung volume  loss, consolidation, right lower lobe, and pleural effusion and pneumonic  infiltrate.  The CT scan of the chest initially read out negative but on my  review, shows a small area of infiltrate and volume loss in the right lower  lobe but no effusion that is evident.  The patient is admitted now for  further inpatient care.  I might also mention the CT chest was done as a PE  protocol and did not show aortic dissection or pulmonary embolism.  The  patient is referred now for evaluation and treatment of pneumonia.   The patient was not on antibiotics initially but has  been started on  antibiotics this afternoon.  He does note a history of sinus congestion for  two weeks, given a course of penicillin and was somewhat better since that  time.   PAST MEDICAL HISTORY:  1. Medical history of hypertension x3 years.  2. History of depression.   ADMISSION MEDICATIONS:  Altace and Celexa.   ALLERGIES:  None.   SOCIAL HISTORY:  Married, works as a Production designer, theatre/television/film person at [employer], smokes  5-10 cigarettes daily x30 years.   FAMILY HISTORY:  Positive for hypertension and peripheral vascular disease  and diabetes.   PHYSICAL EXAMINATION:  GENERAL:  This is a pale, ill-appearing white male in  mild distress, complaining of pleuritic chest pain, inability to take a deep  breath in the right chest.  VITAL SIGNS:  Temperature initially  97, now T-max of 100.3; blood pressure  126/66; pulse 70; respirations 20; saturation 99% on room air.  CHEST:  Chest showed consolidative changes and E-to-A changes in the right  lower lung zone, tenderness to palpation in right lateral chest wall, some  rales noted as well.  CARDIAC:  Regular rate and rhythm, S3, normal S1 and S2.  ABDOMEN:  Soft, nontender.  EXTREMITIES:  No edema or clubbing.  NEUROLOGIC:  Intact.  HEENT/NECK:  No jugular venous distention or lymphadenopathy.  Oropharynx  clear.  Neck supple.   LABORATORY DATA:  Chest x-ray is reviewed and does show  consolidation/infiltrate, right lower lobe, and small pleural effusion.  Lateral decubitus film is pending.  Volume loss in the right lower lung is  seen.  CT scan of the chest, I think, shows a small infiltrate in the right  lower lobe as well but no effusion, no pulmonary embolism, no aortic  dissection.   White count 18,000, hemoglobin 13.1; that was on January 10, 2003.  Today,  the white count is 27,800, hemoglobin 13.8.  Sodium 138, potassium 2.8,  chloride 103, CO2 28, BUN 12, creatinine 0.9, blood sugar 107.  Enzymes are  negative.  Blood cultures  are pending and those were obtained today.  There  is no sputum culture seen for availability.   Echocardiogram was unremarkable.   IMPRESSION:  1. Acute community-acquired pneumonia, no organism specified, with probable     small parapneumonic effusion and acute pleurisy, right lower lobe, in a     patient who is actively smoking.  2. History of hypertension.  3. Depression.   RECOMMENDATIONS:  Agree with IV Rocephin and IV Zithromax.  Would administer  Motrin for pain control and also administer narcotics as needed for pain,  give incentive spirometer for volume expansion, administer oxygen, follow up  hypoxemia, follow up x-ray, follow up sputum culture and obtain Legionella  studies.  I would prefer to have the patient on the hospitalists' primary  service and Pulmonary to consult.                                               Shan Levans, M.D. [facility]    PW/MEDQ  D:  01/11/2003  T:  01/13/2003  Job:  [id number]   cc:   Armanda Magic, M.D.  [address]  [place], [place] [address]  Fax: [phone]

## 2010-06-20 ENCOUNTER — Other Ambulatory Visit: Payer: Self-pay | Admitting: Oncology

## 2010-06-20 ENCOUNTER — Encounter (HOSPITAL_BASED_OUTPATIENT_CLINIC_OR_DEPARTMENT_OTHER): Payer: 59 | Admitting: Oncology

## 2010-06-20 DIAGNOSIS — D72829 Elevated white blood cell count, unspecified: Secondary | ICD-10-CM

## 2010-06-20 LAB — COMPREHENSIVE METABOLIC PANEL
ALT: 32 U/L (ref 0–53)
AST: 17 U/L (ref 0–37)
Albumin: 4.2 g/dL (ref 3.5–5.2)
Alkaline Phosphatase: 63 U/L (ref 39–117)
BUN: 18 mg/dL (ref 6–23)
CO2: 24 mEq/L (ref 19–32)
Calcium: 9.1 mg/dL (ref 8.4–10.5)
Chloride: 103 mEq/L (ref 96–112)
Creatinine, Ser: 0.94 mg/dL (ref 0.40–1.50)
Glucose, Bld: 155 mg/dL — ABNORMAL HIGH (ref 70–99)
Potassium: 3.9 mEq/L (ref 3.5–5.3)
Sodium: 139 mEq/L (ref 135–145)
Total Bilirubin: 1 mg/dL (ref 0.3–1.2)
Total Protein: 6.8 g/dL (ref 6.0–8.3)

## 2010-06-20 LAB — CBC WITH DIFFERENTIAL/PLATELET
BASO%: 0.3 % (ref 0.0–2.0)
Basophils Absolute: 0 10*3/uL (ref 0.0–0.1)
EOS%: 2.7 % (ref 0.0–7.0)
Eosinophils Absolute: 0.3 10*3/uL (ref 0.0–0.5)
HCT: 48.1 % (ref 38.4–49.9)
HGB: 16.3 g/dL (ref 13.0–17.1)
LYMPH%: 20.1 % (ref 14.0–49.0)
MCH: 30.2 pg (ref 27.2–33.4)
MCHC: 33.8 g/dL (ref 32.0–36.0)
MCV: 89.2 fL (ref 79.3–98.0)
MONO#: 0.7 10*3/uL (ref 0.1–0.9)
MONO%: 6.9 % (ref 0.0–14.0)
NEUT#: 6.7 10*3/uL — ABNORMAL HIGH (ref 1.5–6.5)
NEUT%: 70 % (ref 39.0–75.0)
Platelets: 284 10*3/uL (ref 140–400)
RBC: 5.4 10*6/uL (ref 4.20–5.82)
RDW: 13.7 % (ref 11.0–14.6)
WBC: 9.6 10*3/uL (ref 4.0–10.3)
lymph#: 1.9 10*3/uL (ref 0.9–3.3)

## 2010-07-26 ENCOUNTER — Telehealth: Payer: Self-pay | Admitting: Cardiovascular Disease

## 2010-07-26 NOTE — Telephone Encounter (Signed)
LOV,Echo faxed to [facility] @ [phone]  07/26/10/km

## 2010-09-19 ENCOUNTER — Other Ambulatory Visit: Payer: Self-pay | Admitting: Oncology

## 2010-09-19 ENCOUNTER — Encounter (HOSPITAL_BASED_OUTPATIENT_CLINIC_OR_DEPARTMENT_OTHER): Payer: 59 | Admitting: Oncology

## 2010-09-19 DIAGNOSIS — D72829 Elevated white blood cell count, unspecified: Secondary | ICD-10-CM

## 2010-09-19 LAB — CBC WITH DIFFERENTIAL/PLATELET
BASO%: 0.3 % (ref 0.0–2.0)
Basophils Absolute: 0.1 10*3/uL (ref 0.0–0.1)
EOS%: 2 % (ref 0.0–7.0)
Eosinophils Absolute: 0.3 10*3/uL (ref 0.0–0.5)
HCT: 53.3 % — ABNORMAL HIGH (ref 38.4–49.9)
HGB: 18 g/dL — ABNORMAL HIGH (ref 13.0–17.1)
LYMPH%: 12.2 % — ABNORMAL LOW (ref 14.0–49.0)
MCH: 30.2 pg (ref 27.2–33.4)
MCHC: 33.7 g/dL (ref 32.0–36.0)
MCV: 89.6 fL (ref 79.3–98.0)
MONO#: 0.9 10*3/uL (ref 0.1–0.9)
MONO%: 5.7 % (ref 0.0–14.0)
NEUT#: 12.1 10*3/uL — ABNORMAL HIGH (ref 1.5–6.5)
NEUT%: 79.8 % — ABNORMAL HIGH (ref 39.0–75.0)
Platelets: 308 10*3/uL (ref 140–400)
RBC: 5.95 10*6/uL — ABNORMAL HIGH (ref 4.20–5.82)
RDW: 14.5 % (ref 11.0–14.6)
WBC: 15.1 10*3/uL — ABNORMAL HIGH (ref 4.0–10.3)
lymph#: 1.8 10*3/uL (ref 0.9–3.3)

## 2010-10-15 DIAGNOSIS — E538 Deficiency of other specified B group vitamins: Secondary | ICD-10-CM | POA: Insufficient documentation

## 2010-12-13 ENCOUNTER — Encounter: Payer: Self-pay | Admitting: *Deleted

## 2010-12-15 ENCOUNTER — Encounter: Payer: Self-pay | Admitting: Oncology

## 2010-12-15 DIAGNOSIS — N400 Enlarged prostate without lower urinary tract symptoms: Secondary | ICD-10-CM | POA: Insufficient documentation

## 2010-12-15 DIAGNOSIS — D72829 Elevated white blood cell count, unspecified: Secondary | ICD-10-CM | POA: Insufficient documentation

## 2010-12-15 DIAGNOSIS — I10 Essential (primary) hypertension: Secondary | ICD-10-CM | POA: Insufficient documentation

## 2010-12-19 ENCOUNTER — Other Ambulatory Visit: Payer: Self-pay | Admitting: Oncology

## 2010-12-19 ENCOUNTER — Ambulatory Visit (HOSPITAL_BASED_OUTPATIENT_CLINIC_OR_DEPARTMENT_OTHER): Payer: 59 | Admitting: Oncology

## 2010-12-19 ENCOUNTER — Other Ambulatory Visit (HOSPITAL_BASED_OUTPATIENT_CLINIC_OR_DEPARTMENT_OTHER): Payer: 59 | Admitting: Lab

## 2010-12-19 DIAGNOSIS — D72829 Elevated white blood cell count, unspecified: Secondary | ICD-10-CM

## 2010-12-19 DIAGNOSIS — D45 Polycythemia vera: Secondary | ICD-10-CM

## 2010-12-19 DIAGNOSIS — D751 Secondary polycythemia: Secondary | ICD-10-CM

## 2010-12-19 DIAGNOSIS — N400 Enlarged prostate without lower urinary tract symptoms: Secondary | ICD-10-CM

## 2010-12-19 DIAGNOSIS — I10 Essential (primary) hypertension: Secondary | ICD-10-CM

## 2010-12-19 LAB — COMPREHENSIVE METABOLIC PANEL
ALT: 34 U/L (ref 0–53)
AST: 26 U/L (ref 0–37)
Albumin: 4.5 g/dL (ref 3.5–5.2)
Alkaline Phosphatase: 63 U/L (ref 39–117)
BUN: 19 mg/dL (ref 6–23)
CO2: 27 mEq/L (ref 19–32)
Calcium: 9 mg/dL (ref 8.4–10.5)
Chloride: 101 mEq/L (ref 96–112)
Creatinine, Ser: 1.11 mg/dL (ref 0.50–1.35)
Glucose, Bld: 99 mg/dL (ref 70–99)
Potassium: 4.2 mEq/L (ref 3.5–5.3)
Sodium: 139 mEq/L (ref 135–145)
Total Bilirubin: 0.9 mg/dL (ref 0.3–1.2)
Total Protein: 6.9 g/dL (ref 6.0–8.3)

## 2010-12-19 LAB — CHCC SMEAR

## 2010-12-19 LAB — CBC WITH DIFFERENTIAL/PLATELET
BASO%: 0.2 % (ref 0.0–2.0)
Basophils Absolute: 0 10*3/uL (ref 0.0–0.1)
EOS%: 2.2 % (ref 0.0–7.0)
Eosinophils Absolute: 0.3 10*3/uL (ref 0.0–0.5)
HCT: 53.5 % — ABNORMAL HIGH (ref 38.4–49.9)
HGB: 18 g/dL — ABNORMAL HIGH (ref 13.0–17.1)
LYMPH%: 14.9 % (ref 14.0–49.0)
MCH: 30.3 pg (ref 27.2–33.4)
MCHC: 33.7 g/dL (ref 32.0–36.0)
MCV: 90 fL (ref 79.3–98.0)
MONO#: 1 10*3/uL — ABNORMAL HIGH (ref 0.1–0.9)
MONO%: 7.4 % (ref 0.0–14.0)
NEUT#: 9.9 10*3/uL — ABNORMAL HIGH (ref 1.5–6.5)
NEUT%: 75.3 % — ABNORMAL HIGH (ref 39.0–75.0)
Platelets: 242 10*3/uL (ref 140–400)
RBC: 5.94 10*6/uL — ABNORMAL HIGH (ref 4.20–5.82)
RDW: 14.6 % (ref 11.0–14.6)
WBC: 13.1 10*3/uL — ABNORMAL HIGH (ref 4.0–10.3)
lymph#: 2 10*3/uL (ref 0.9–3.3)

## 2010-12-19 NOTE — Progress Notes (Signed)
[facility] OFFICE PROGRESS NOTE   CURRENT DIAGNOSIS:  Neutrophil predominant leukocytosis; NOS; and polycythemia.   CURRENT THERAPY:  Watchful observation.  INTERVAL HISTORY: Sean Green 55 y.o. male returns for regular follow up.  He reports feeling well.  He denies headache, anorexia, weight loss, chest pain, bleeding symptoms, skin rash.  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  However, he denies need for chronic pain medication, weakness in right hip.  He takes HCTZ for HTN, drinks tea/coffee; and thus, has frequent urination during the day.  He does not smoke himself; however, he does have 2nd hand smoke from his coworkers.   Patient denies fatigue, headache, visual changes, confusion, drenching night sweats, palpable lymph node swelling, mucositis, odynophagia, dysphagia, nausea vomiting, jaundice, chest pain, palpitation, shortness of breath, dyspnea on exertion, productive cough, gum bleeding, epistaxis, hematemesis, hemoptysis, abdominal pain, abdominal swelling, early satiety, melena, hematochezia, hematuria, skin rash, spontaneous bleeding, joint swelling, joint pain, heat or cold intolerance, bowel bladder incontinence, back pain, focal motor weakness, paresthesia, depression, suicidal or homocidal ideation, feeling hopelessness.   MEDICAL HISTORY: Past Medical History  Diagnosis Date  . Hypertension   . BPH (benign prostatic hyperplasia)   . Hypogonadism male   . Pericarditis   . Leukocytosis, unspecified     SURGICAL HISTORY: No past surgical history on file.  MEDICATIONS: Current Outpatient Prescriptions  Medication Sig Dispense Refill  . amLODipine (NORVASC) 5 MG tablet Take 5 mg by mouth daily.        Marland Kitchen aspirin 81 MG tablet Take 81 mg by mouth daily.        . citalopram (CELEXA) 40 MG tablet Take 40 mg by mouth daily.        Marland Kitchen lisinopril (PRINIVIL,ZESTRIL) 20 MG tablet Take 20 mg by  mouth daily.        . Nutritional Supplements (MELATONIN PO) Take 6 mg by mouth at bedtime as needed.        Marland Kitchen CIALIS 5 MG tablet       . lisinopril-hydrochlorothiazide (PRINZIDE,ZESTORETIC) 20-25 MG per tablet         ALLERGIES:   has no known allergies.  REVIEW OF SYSTEMS:  The rest of the 14-point review of system was negative.   Filed Vitals:   12/19/10 0851  BP: 135/89  Pulse: 95  Temp: 97.8 F (36.6 C)   Wt Readings from Last 3 Encounters:  12/19/10 288 lb (130.636 kg)  06/20/10 281 lb 9.6 oz (127.733 kg)  04/19/09 274 lb 4 oz (124.399 kg)   ECOG Performance status: 0  PHYSICAL EXAMINATION:   General:  well-nourished in no acute distress.  Eyes:  no scleral icterus.  ENT:  There were no oropharyngeal lesions.  Neck was without thyromegaly.  Lymphatics:  Negative cervical, supraclavicular or axillary adenopathy.  Respiratory: lungs were clear bilaterally without wheezing or crackles.  Cardiovascular:  Regular rate and rhythm, S1/S2, without murmur, rub or gallop.  There was no pedal edema.  GI:  abdomen was soft, flat, nontender, nondistended, without organomegaly.  Muscoloskeletal:  no spinal tenderness of palpation of vertebral spine.  Skin exam was without echymosis, petichae.  Neuro exam was nonfocal.  Patient was able to get on and off exam table without assistance.  Gait was normal.  Patient was alerted and oriented.  Attention was good.   Language was appropriate.  Mood was normal without depression.  Speech was not  pressured.  Thought content was not tangential.     LABORATORY/RADIOLOGY DATA: WBC 13.1; Hgb 18; Plt 242.   ASSESSMENT AND PLAN:   1.  Polycythemia:  Most likely due to HCTZ and 2nd hand smoke.  However, given leukocytosis, cannot rule out polythemia vera.  I sent for JAK-2 mutation testing today.   2.  Leukocytosis:  Most likely reactive.  Cannot rule out PV (as above).  Work up in the past was negative for flow cytometry and BCR/ABL.  If in the future,  his leukocytosis significantly worsens, I may consider diagnostic bone marrow biopsy.  3.  HTN:  On Amlodipine and Lisinopril/HCTZ per PCP.  4.  BPH. 5.  Hypogonadism. 6.  Follow up:  Lab only in 3 months; RV with me in 6 months.

## 2010-12-20 ENCOUNTER — Telehealth: Payer: Self-pay | Admitting: *Deleted

## 2010-12-20 NOTE — Telephone Encounter (Signed)
VM from pt stating he forgot to tell Dr. Gaylyn Rong on his visit yesterday that he has had "blood shot eyes" for the past 1 to 2 months. He wanted Dr. Gaylyn Rong to be aware in case it was important.

## 2010-12-24 LAB — JAK-2 V617F

## 2010-12-30 ENCOUNTER — Telehealth: Payer: Self-pay | Admitting: *Deleted

## 2010-12-30 NOTE — Telephone Encounter (Signed)
VM from pt stating he was calling for lab results.  He also wanted Dr. Gaylyn Rong to be aware he is receiving twice weekly injections of Testosterone and Vitamin B12.

## 2011-01-07 ENCOUNTER — Other Ambulatory Visit: Payer: Self-pay | Admitting: Specialist

## 2011-01-07 DIAGNOSIS — M25512 Pain in left shoulder: Secondary | ICD-10-CM

## 2011-01-16 DIAGNOSIS — Z9660 Presence of unspecified orthopedic joint implant: Secondary | ICD-10-CM | POA: Insufficient documentation

## 2011-01-17 ENCOUNTER — Ambulatory Visit
Admission: RE | Admit: 2011-01-17 | Discharge: 2011-01-17 | Disposition: A | Discharge status: Home / Self Care | Payer: 59 | Source: Ambulatory Visit | Attending: Specialist | Admitting: Specialist

## 2011-01-17 DIAGNOSIS — M25512 Pain in left shoulder: Secondary | ICD-10-CM

## 2011-03-18 ENCOUNTER — Telehealth: Payer: Self-pay | Admitting: *Deleted

## 2011-03-18 NOTE — Telephone Encounter (Signed)
Pt called, confirmed his lab appt for tomorrow.  States has MRI at [facility] at 845am tomorroq.  Informed him he should be able to make 8 am lab here and get there in time.  He agreed, will keep appt same for now.

## 2011-03-19 ENCOUNTER — Other Ambulatory Visit (HOSPITAL_BASED_OUTPATIENT_CLINIC_OR_DEPARTMENT_OTHER): Payer: 59 | Admitting: Lab

## 2011-03-19 DIAGNOSIS — D751 Secondary polycythemia: Secondary | ICD-10-CM

## 2011-03-19 DIAGNOSIS — D72829 Elevated white blood cell count, unspecified: Secondary | ICD-10-CM

## 2011-03-19 LAB — CBC WITH DIFFERENTIAL/PLATELET
Basophils Absolute: 0 10*3/uL (ref 0.0–0.1)
EOS%: 1.9 % (ref 0.0–7.0)
Eosinophils Absolute: 0.2 10*3/uL (ref 0.0–0.5)
HGB: 17.2 g/dL — ABNORMAL HIGH (ref 13.0–17.1)
MCH: 30.3 pg (ref 27.2–33.4)
NEUT#: 8.8 10*3/uL — ABNORMAL HIGH (ref 1.5–6.5)
RDW: 13.4 % (ref 11.0–14.6)
lymph#: 2.1 10*3/uL (ref 0.9–3.3)

## 2011-03-20 ENCOUNTER — Other Ambulatory Visit: Payer: Self-pay | Admitting: *Deleted

## 2011-03-20 ENCOUNTER — Telehealth: Payer: Self-pay | Admitting: *Deleted

## 2011-03-20 NOTE — Telephone Encounter (Signed)
Pt returned call and I informed him of lab results and per Dr. Gaylyn Rong his WBC and Hgb are slightly lower, improved.  Instructed pt to expect call for appt in 3 months for labs and to see Dr. Gaylyn Rong.  Call sooner if any problems, questions. He verbalized understanding.

## 2011-04-01 DIAGNOSIS — E291 Testicular hypofunction: Secondary | ICD-10-CM | POA: Insufficient documentation

## 2011-04-01 DIAGNOSIS — N486 Induration penis plastica: Secondary | ICD-10-CM | POA: Insufficient documentation

## 2011-04-01 DIAGNOSIS — N529 Male erectile dysfunction, unspecified: Secondary | ICD-10-CM | POA: Insufficient documentation

## 2011-05-29 ENCOUNTER — Other Ambulatory Visit: Payer: Self-pay | Admitting: Neurosurgery

## 2011-06-17 ENCOUNTER — Other Ambulatory Visit: Payer: 59 | Admitting: Lab

## 2011-06-18 ENCOUNTER — Ambulatory Visit: Payer: 59 | Admitting: Oncology

## 2011-06-18 ENCOUNTER — Other Ambulatory Visit: Payer: 59 | Admitting: Lab

## 2011-06-18 ENCOUNTER — Encounter (HOSPITAL_COMMUNITY)
Admission: RE | Admit: 2011-06-18 | Discharge: 2011-06-18 | Disposition: A | Discharge status: Home / Self Care | Payer: 59 | Source: Ambulatory Visit | Attending: Neurosurgery | Admitting: Neurosurgery

## 2011-06-18 ENCOUNTER — Encounter (HOSPITAL_COMMUNITY): Payer: Self-pay

## 2011-06-18 ENCOUNTER — Ambulatory Visit (HOSPITAL_BASED_OUTPATIENT_CLINIC_OR_DEPARTMENT_OTHER): Payer: 59 | Admitting: Oncology

## 2011-06-18 VITALS — BP 116/82 | HR 88 | Temp 97.8°F | Ht 74.0 in | Wt 280.2 lb

## 2011-06-18 DIAGNOSIS — D72829 Elevated white blood cell count, unspecified: Secondary | ICD-10-CM

## 2011-06-18 DIAGNOSIS — D751 Secondary polycythemia: Secondary | ICD-10-CM

## 2011-06-18 DIAGNOSIS — D45 Polycythemia vera: Secondary | ICD-10-CM

## 2011-06-18 HISTORY — DX: Dizziness and giddiness: R42

## 2011-06-18 HISTORY — DX: Acute myocardial infarction, unspecified: I21.9

## 2011-06-18 HISTORY — DX: Other complications of anesthesia, initial encounter: T88.59XA

## 2011-06-18 HISTORY — DX: Pneumonia, unspecified organism: J18.9

## 2011-06-18 HISTORY — DX: Benign prostatic hyperplasia with lower urinary tract symptoms: N40.1

## 2011-06-18 HISTORY — DX: Adverse effect of unspecified anesthetic, initial encounter: T41.45XA

## 2011-06-18 HISTORY — DX: Myoneural disorder, unspecified: G70.9

## 2011-06-18 HISTORY — DX: Nocturia: R35.1

## 2011-06-18 LAB — COMPREHENSIVE METABOLIC PANEL
ALT: 31 U/L (ref 0–53)
Albumin: 4.4 g/dL (ref 3.5–5.2)
CO2: 26 mEq/L (ref 19–32)
Calcium: 9.1 mg/dL (ref 8.4–10.5)
Chloride: 104 mEq/L (ref 96–112)
Glucose, Bld: 137 mg/dL — ABNORMAL HIGH (ref 70–99)
Potassium: 4 mEq/L (ref 3.5–5.3)
Sodium: 141 mEq/L (ref 135–145)
Total Protein: 6.5 g/dL (ref 6.0–8.3)

## 2011-06-18 LAB — BASIC METABOLIC PANEL
BUN: 17 mg/dL (ref 6–23)
CO2: 28 mEq/L (ref 19–32)
Calcium: 9.5 mg/dL (ref 8.4–10.5)
Chloride: 101 mEq/L (ref 96–112)
Creatinine, Ser: 0.95 mg/dL (ref 0.50–1.35)
GFR calc Af Amer: >90 mL/min (ref >90)
GFR calc non Af Amer: >90 mL/min (ref >90)
Glucose, Bld: 82 mg/dL (ref 70–99)
Potassium: 4 mEq/L (ref 3.5–5.1)
Sodium: 140 mEq/L (ref 135–145)

## 2011-06-18 LAB — SURGICAL PCR SCREEN
MRSA, PCR: NEGATIVE
Staphylococcus aureus: NEGATIVE

## 2011-06-18 LAB — CBC WITH DIFFERENTIAL/PLATELET
BASO%: 0.4 % (ref 0.0–2.0)
Eosinophils Absolute: 0.3 10*3/uL (ref 0.0–0.5)
LYMPH%: 22.2 % (ref 14.0–49.0)
MCHC: 34 g/dL (ref 32.0–36.0)
MONO#: 0.6 10*3/uL (ref 0.1–0.9)
NEUT#: 6.4 10*3/uL (ref 1.5–6.5)
Platelets: 272 10*3/uL (ref 140–400)
RBC: 5.02 10*6/uL (ref 4.20–5.82)
RDW: 13.2 % (ref 11.0–14.6)
WBC: 9.5 10*3/uL (ref 4.0–10.3)
lymph#: 2.1 10*3/uL (ref 0.9–3.3)
nRBC: 0 % (ref 0–0)

## 2011-06-18 NOTE — Progress Notes (Signed)
Primary physician - dr. Paulino Rily Cardiologist - dr. Excell Seltzer; echo, cardiac cath, ekg in 2010 in epic

## 2011-06-18 NOTE — Patient Instructions (Signed)
A.  Issues:  Elevated White Blood Cell (WBC) and hemoglobin (Hgb):  Resolved at this time. - Most likely they were elevated due to an unknown reactive process.   B.  Follow up:  Discharge to Primary Care Physician PCP.  Please have your PCP obtain complete blood count (CBC) at least yearly to follow up.    - If in the future, your WBC significantly increases again to >15K, then we will be more than happy to evaluate you again.    Thank you.

## 2011-06-18 NOTE — Pre-Procedure Instructions (Addendum)
20 AMARO MANGOLD  06/18/2011   Your procedure is scheduled on:  June 25, 2011  Report to [facility] Stay [facility] at 0600 AM.  Call this number if you have problems the morning of surgery: [phone]   Remember:   Do not eat food:After Midnight.  May have clear liquids: up to 4 Hours before arrival.  Clear liquids include soda, tea, black coffee, apple or grape juice, broth.  Take these medicines the morning of surgery with A SIP OF WATER: xanax (if needed), celexa, norco (if needed), norvasc   Do not wear jewelry, make-up or nail polish.  Do not wear lotions, powders, or perfumes. You may wear deodorant.  Do not shave 48 hours prior to surgery. Men may shave face and neck.  Do not bring valuables to the hospital.  Contacts, dentures or bridgework may not be worn into surgery.  Leave suitcase in the car. After surgery it may be brought to your room.  For patients admitted to the hospital, checkout time is 11:00 AM the day of discharge.   Patients discharged the day of surgery will not be allowed to drive home.  Special Instructions: CHG Shower Use Special Wash: 1/2 bottle night before surgery and 1/2 bottle morning of surgery.   Please read over the following fact sheets that you were given: Pain Booklet, Coughing and Deep Breathing, MRSA Information and Surgical Site Infection Prevention

## 2011-06-18 NOTE — Progress Notes (Signed)
[facility]  Telephone:([phone] [phone]   OFFICE PROGRESS NOTE   Cc:  Emeterio Reeve, MD, MD  DIAGNOSIS: Neutrophil predominant leukocytosis; NOS; and polycythemia.   CURRENT THERAPY: Watchful observation.  INTERVAL HISTORY: Sean Green 56 y.o. male returns for regular follow up.  He has been having left neck/shoulder/shoulder blade pain in addition to weakness and numbness of his left fingers.  He was evaluated by Dr. Newell Coral and plan for cervical fusion next week.  He also develops dizziness from both sitting up too fast and also lying down with his head a certain location.  He denies headache, visual changes, focal motor weakness except for his left fingers.    Patient denies fatigue, drenching night sweats, palpable lymph node swelling, mucositis, odynophagia, dysphagia, nausea vomiting, jaundice, chest pain, palpitation, shortness of breath, dyspnea on exertion, productive cough, gum bleeding, epistaxis, hematemesis, hemoptysis, abdominal pain, abdominal swelling, early satiety, melena, hematochezia, hematuria, skin rash, spontaneous bleeding, joint swelling, joint pain, heat or cold intolerance, bowel bladder incontinence, back pain.    Past Medical History  Diagnosis Date  . Hypertension   . BPH (benign prostatic hyperplasia)   . Hypogonadism male   . Pericarditis   . Leukocytosis, unspecified     No past surgical history on file.  Current Outpatient Prescriptions  Medication Sig Dispense Refill  . ALPRAZolam (XANAX) 0.5 MG tablet Take 0.5 mg by mouth as needed.      Marland Kitchen amLODipine (NORVASC) 5 MG tablet Take 5 mg by mouth daily.        Marland Kitchen aspirin 81 MG tablet Take 81 mg by mouth daily.        Marland Kitchen CIALIS 5 MG tablet       . citalopram (CELEXA) 40 MG tablet Take 40 mg by mouth daily.        Marland Kitchen HYDROcodone-acetaminophen (NORCO) 5-325 MG per tablet 5-325mg  PRN      . lisinopril (PRINIVIL,ZESTRIL) 20 MG tablet Take 20 mg by mouth daily.          Marland Kitchen lisinopril-hydrochlorothiazide (PRINZIDE,ZESTORETIC) 20-25 MG per tablet       . methocarbamol (ROBAXIN) 500 MG tablet 500 mg. prn      . Nutritional Supplements (MELATONIN PO) Take 6 mg by mouth at bedtime as needed.          ALLERGIES:   has no known allergies.  REVIEW OF SYSTEMS:  The rest of the 14-point review of system was negative.   Filed Vitals:   06/18/11 0853  BP: 116/82  Pulse: 88  Temp: 97.8 F (36.6 C)   Wt Readings from Last 3 Encounters:  06/18/11 280 lb 3.2 oz (127.098 kg)  12/19/10 288 lb (130.636 kg)  06/20/10 281 lb 9.6 oz (127.733 kg)   ECOG Performance status: 0  PHYSICAL EXAMINATION:   General:  well-nourished man, in no acute distress.  Eyes:  no scleral icterus.  ENT:  There were no oropharyngeal lesions.  Neck was without thyromegaly.  Lymphatics:  Negative cervical, supraclavicular or axillary adenopathy.  Respiratory: lungs were clear bilaterally without wheezing or crackles.  Cardiovascular:  Regular rate and rhythm, S1/S2, without murmur, rub or gallop.  There was no pedal edema.  GI:  abdomen was soft, flat, nontender, nondistended, without organomegaly.  Muscoloskeletal:  no spinal tenderness of palpation of vertebral spine.  Skin exam was without echymosis, petichae.  Neuro exam was nonfocal.  Patient was able to get on and off exam table without assistance.  Gait  was normal.  Patient was alerted and oriented.  Attention was good.   Language was appropriate.  Mood was normal without depression.  Speech was not pressured.  Thought content was not tangential.     LABORATORY/RADIOLOGY DATA:  Lab Results  Component Value Date   WBC 9.5 06/18/2011   HGB 15.7 06/18/2011   HCT 46.2 06/18/2011   PLT 272 06/18/2011   GLUCOSE 99 12/19/2010   GLUCOSE 99 12/19/2010   CHOL  Value: 80        ATP III CLASSIFICATION:  <200     mg/dL   Desirable  161-096  mg/dL   Borderline High  >=045    mg/dL   High        04/28/8117   TRIG 38 03/19/2009   HDL 29* 03/19/2009    LDLCALC  Value: 43        Total Cholesterol/HDL:CHD Risk Coronary Heart Disease Risk Table                     Men   Women  1/2 Average Risk   3.4   3.3  Average Risk       5.0   4.4  2 X Average Risk   9.6   7.1  3 X Average Risk  23.4   11.0        Use the calculated Patient Ratio above and the CHD Risk Table to determine the patient's CHD Risk.        ATP III CLASSIFICATION (LDL):  <100     mg/dL   Optimal  147-829  mg/dL   Near or Above                    Optimal  130-159  mg/dL   Borderline  562-130  mg/dL   High  >865     mg/dL   Very High 7/84/6962   ALKPHOS 63 12/19/2010   ALKPHOS 63 12/19/2010   ALT 34 12/19/2010   ALT 34 12/19/2010   AST 26 12/19/2010   AST 26 12/19/2010   NA 139 12/19/2010   NA 139 12/19/2010   K 4.2 12/19/2010   K 4.2 12/19/2010   CL 101 12/19/2010   CL 101 12/19/2010   CREATININE 1.11 12/19/2010   CREATININE 1.11 12/19/2010   BUN 19 12/19/2010   BUN 19 12/19/2010   CO2 27 12/19/2010   CO2 27 12/19/2010    ASSESSMENT AND PLAN:   1. Polycythemia: Most likely due to HCTZ and 2nd hand smoke. His JAK-2 mutation was negative ruling out polycythemia vera (PV).  His Hgb is normal today.   2. Leukocytosis: Most likely reactive to chronic inflammation. Work up in the past was negative for flow cytometry and BCR/ABL. His WBC is normal today.  I do not advocate further work up at this point.  I recommended to Dr. Paulino Rily to follow his CBC yearly.  In the future, if his WBC continues to worsen to >15K, then I may consider diagnostic bone marrow biopsy.   3. HTN: On Amlodipine and Lisinopril/HCTZ per PCP.   4.  Cervical disc disease:  Pending surgery next week.  From hem standpoint, there is no contraindication for the surgery.   5.  Vertigo:  Most likely benign positional vertigo.  Management per Dr. Paulino Rily.   6.  Age appropriate cancer screening:  He is due for follow up screening colonoscopy later this year.   7.  Dispo:  I discharged patient  back to Dr. Paulino Rily  since his leukocytosis and polycythemia have resolved.  Our service is always available in the future if the need arises.  Thank you for this referral.           The length of time of the face-to-face encounter was 10 minutes. More than 50% of time was spent counseling and coordination of care.

## 2011-06-19 ENCOUNTER — Inpatient Hospital Stay (HOSPITAL_COMMUNITY): Admission: RE | Admit: 2011-06-19 | Payer: 59 | Source: Ambulatory Visit

## 2011-06-24 MED ORDER — CEFAZOLIN SODIUM 1-5 GM-% IV SOLN: 1.0000 g | INTRAVENOUS | Status: DC

## 2011-06-24 MED ORDER — CEFAZOLIN SODIUM-DEXTROSE 2-3 GM-% IV SOLR
2.0000 g | INTRAVENOUS | Status: DC
  Filled 2011-06-24: qty 50

## 2011-06-25 ENCOUNTER — Encounter (HOSPITAL_COMMUNITY)
Admission: RE | Disposition: A | Discharge status: Home / Self Care | Payer: Self-pay | Source: Ambulatory Visit | Attending: Neurosurgery

## 2011-06-25 ENCOUNTER — Ambulatory Visit (HOSPITAL_COMMUNITY): Payer: 59

## 2011-06-25 ENCOUNTER — Ambulatory Visit (HOSPITAL_COMMUNITY)
Admission: RE | Admit: 2011-06-25 | Discharge: 2011-06-26 | Disposition: A | Discharge status: Home / Self Care | Payer: 59 | Source: Ambulatory Visit | Attending: Neurosurgery | Admitting: Neurosurgery

## 2011-06-25 ENCOUNTER — Encounter (HOSPITAL_COMMUNITY): Payer: Self-pay | Admitting: Anesthesiology

## 2011-06-25 ENCOUNTER — Ambulatory Visit (HOSPITAL_COMMUNITY): Payer: 59 | Admitting: Anesthesiology

## 2011-06-25 DIAGNOSIS — D751 Secondary polycythemia: Secondary | ICD-10-CM

## 2011-06-25 DIAGNOSIS — M5030 Other cervical disc degeneration, unspecified cervical region: Secondary | ICD-10-CM | POA: Insufficient documentation

## 2011-06-25 DIAGNOSIS — D72829 Elevated white blood cell count, unspecified: Secondary | ICD-10-CM

## 2011-06-25 DIAGNOSIS — Z01812 Encounter for preprocedural laboratory examination: Secondary | ICD-10-CM | POA: Insufficient documentation

## 2011-06-25 DIAGNOSIS — I10 Essential (primary) hypertension: Secondary | ICD-10-CM | POA: Insufficient documentation

## 2011-06-25 DIAGNOSIS — M47812 Spondylosis without myelopathy or radiculopathy, cervical region: Secondary | ICD-10-CM | POA: Insufficient documentation

## 2011-06-25 DIAGNOSIS — I2510 Atherosclerotic heart disease of native coronary artery without angina pectoris: Secondary | ICD-10-CM | POA: Insufficient documentation

## 2011-06-25 DIAGNOSIS — Z01810 Encounter for preprocedural cardiovascular examination: Secondary | ICD-10-CM | POA: Insufficient documentation

## 2011-06-25 HISTORY — PX: ANTERIOR CERVICAL DECOMP/DISCECTOMY FUSION: SHX1161

## 2011-06-25 SURGERY — ANTERIOR CERVICAL DECOMPRESSION/DISCECTOMY FUSION 3 LEVELS
Anesthesia: General | Wound class: Clean

## 2011-06-25 MED ORDER — KCL IN DEXTROSE-NACL 20-5-0.45 MEQ/L-%-% IV SOLN
INTRAVENOUS | Status: DC
  Administered 2011-06-25: 16:00:00 via INTRAVENOUS
  Filled 2011-06-25 (×5): qty 1000

## 2011-06-25 MED ORDER — HYDROMORPHONE HCL PF 1 MG/ML IJ SOLN
0.2500 mg | INTRAMUSCULAR | Status: DC | PRN
  Administered 2011-06-25: 0.5 mg via INTRAVENOUS
  Administered 2011-06-25: 0.25 mg via INTRAVENOUS

## 2011-06-25 MED ORDER — CEFAZOLIN SODIUM 1-5 GM-% IV SOLN
INTRAVENOUS | Status: DC | PRN
  Administered 2011-06-25: 2 g via INTRAVENOUS

## 2011-06-25 MED ORDER — KETOROLAC TROMETHAMINE 30 MG/ML IJ SOLN
30.0000 mg | Freq: Once | INTRAMUSCULAR | Status: DC
Start: 2011-06-25 — End: 2011-06-25

## 2011-06-25 MED ORDER — CITALOPRAM HYDROBROMIDE 40 MG PO TABS
40.0000 mg | ORAL_TABLET | Freq: Every day | ORAL | Status: DC
  Administered 2011-06-26: 40 mg via ORAL
  Filled 2011-06-25: qty 1

## 2011-06-25 MED ORDER — MIDAZOLAM HCL 5 MG/5ML IJ SOLN
INTRAMUSCULAR | Status: DC | PRN
  Administered 2011-06-25: 2 mg via INTRAVENOUS

## 2011-06-25 MED ORDER — SODIUM CHLORIDE 0.9 % IJ SOLN
3.0000 mL | Freq: Two times a day (BID) | INTRAMUSCULAR | Status: DC
  Administered 2011-06-25 – 2011-06-26 (×2): 3 mL via INTRAVENOUS

## 2011-06-25 MED ORDER — ROCURONIUM BROMIDE 100 MG/10ML IV SOLN
INTRAVENOUS | Status: DC | PRN
  Administered 2011-06-25 (×2): 50 mg via INTRAVENOUS

## 2011-06-25 MED ORDER — NEOSTIGMINE METHYLSULFATE 1 MG/ML IJ SOLN
INTRAMUSCULAR | Status: DC | PRN
  Administered 2011-06-25: 2 mg via INTRAVENOUS

## 2011-06-25 MED ORDER — SODIUM CHLORIDE 0.9 % IV SOLN
INTRAVENOUS | Status: AC
  Filled 2011-06-25: qty 500

## 2011-06-25 MED ORDER — HYDROCHLOROTHIAZIDE 25 MG PO TABS
25.0000 mg | ORAL_TABLET | Freq: Every day | ORAL | Status: DC
  Administered 2011-06-26: 25 mg via ORAL
  Filled 2011-06-25: qty 1

## 2011-06-25 MED ORDER — ACETAMINOPHEN 325 MG PO TABS: 650.0000 mg | ORAL_TABLET | ORAL | Status: DC | PRN

## 2011-06-25 MED ORDER — ACETAMINOPHEN 650 MG RE SUPP: 650.0000 mg | RECTAL | Status: DC | PRN

## 2011-06-25 MED ORDER — KETOROLAC TROMETHAMINE 30 MG/ML IJ SOLN
30.0000 mg | Freq: Four times a day (QID) | INTRAMUSCULAR | Status: DC
  Administered 2011-06-25 – 2011-06-26 (×3): 30 mg via INTRAVENOUS
  Filled 2011-06-25 (×7): qty 1

## 2011-06-25 MED ORDER — FENTANYL CITRATE 0.05 MG/ML IJ SOLN
INTRAMUSCULAR | Status: DC | PRN
  Administered 2011-06-25: 200 ug via INTRAVENOUS
  Administered 2011-06-25 (×2): 50 ug via INTRAVENOUS

## 2011-06-25 MED ORDER — ZOLPIDEM TARTRATE 5 MG PO TABS: 10.0000 mg | ORAL_TABLET | Freq: Every evening | ORAL | Status: DC | PRN

## 2011-06-25 MED ORDER — HYDROXYZINE HCL 50 MG/ML IM SOLN: 50.0000 mg | INTRAMUSCULAR | Status: DC | PRN

## 2011-06-25 MED ORDER — THROMBIN 20000 UNITS EX KIT
PACK | CUTANEOUS | Status: DC | PRN
  Administered 2011-06-25: 20000 [IU] via TOPICAL

## 2011-06-25 MED ORDER — OLOPATADINE HCL 0.2 % OP SOLN: 1.0000 [drp] | Freq: Every day | OPHTHALMIC | Status: DC | PRN

## 2011-06-25 MED ORDER — SODIUM CHLORIDE 0.9 % IJ SOLN: 3.0000 mL | INTRAMUSCULAR | Status: DC | PRN

## 2011-06-25 MED ORDER — ALUM & MAG HYDROXIDE-SIMETH 200-200-20 MG/5ML PO SUSP: 30.0000 mL | Freq: Four times a day (QID) | ORAL | Status: DC | PRN

## 2011-06-25 MED ORDER — ALPRAZOLAM 0.5 MG PO TABS: 0.5000 mg | ORAL_TABLET | Freq: Two times a day (BID) | ORAL | Status: DC | PRN

## 2011-06-25 MED ORDER — HYDROXYZINE HCL 25 MG PO TABS: 50.0000 mg | ORAL_TABLET | ORAL | Status: DC | PRN

## 2011-06-25 MED ORDER — AMLODIPINE BESYLATE 5 MG PO TABS
5.0000 mg | ORAL_TABLET | Freq: Every day | ORAL | Status: DC
  Administered 2011-06-25 – 2011-06-26 (×2): 5 mg via ORAL
  Filled 2011-06-25 (×2): qty 1

## 2011-06-25 MED ORDER — HEMOSTATIC AGENTS (NO CHARGE) OPTIME
TOPICAL | Status: DC | PRN
  Administered 2011-06-25: 1 via TOPICAL

## 2011-06-25 MED ORDER — HYDROCODONE-ACETAMINOPHEN 5-325 MG PO TABS: 1.0000 | ORAL_TABLET | ORAL | Status: DC | PRN

## 2011-06-25 MED ORDER — BISACODYL 10 MG RE SUPP: 10.0000 mg | Freq: Every day | RECTAL | Status: DC | PRN

## 2011-06-25 MED ORDER — LIDOCAINE-EPINEPHRINE 1 %-1:100000 IJ SOLN
INTRAMUSCULAR | Status: DC | PRN
  Administered 2011-06-25: 10 mL via INTRADERMAL

## 2011-06-25 MED ORDER — CYCLOBENZAPRINE HCL 10 MG PO TABS
10.0000 mg | ORAL_TABLET | Freq: Three times a day (TID) | ORAL | Status: DC | PRN
  Administered 2011-06-25: 10 mg via ORAL
  Filled 2011-06-25: qty 1

## 2011-06-25 MED ORDER — DEXAMETHASONE SODIUM PHOSPHATE 4 MG/ML IJ SOLN
INTRAMUSCULAR | Status: DC | PRN
  Administered 2011-06-25: 8 mg via INTRAVENOUS

## 2011-06-25 MED ORDER — PHENOL 1.4 % MT LIQD: 1.0000 | OROMUCOSAL | Status: DC | PRN

## 2011-06-25 MED ORDER — HYDROMORPHONE HCL PF 1 MG/ML IJ SOLN
INTRAMUSCULAR | Status: AC
  Filled 2011-06-25: qty 1

## 2011-06-25 MED ORDER — MIDAZOLAM HCL 2 MG/2ML IJ SOLN: 0.5000 mg | Freq: Once | INTRAMUSCULAR | Status: DC | PRN

## 2011-06-25 MED ORDER — ACETAMINOPHEN 10 MG/ML IV SOLN
INTRAVENOUS | Status: AC
  Filled 2011-06-25: qty 100

## 2011-06-25 MED ORDER — BUPIVACAINE HCL 0.5 % IJ SOLN
INTRAMUSCULAR | Status: DC | PRN
  Administered 2011-06-25: 10 mL

## 2011-06-25 MED ORDER — MAGNESIUM HYDROXIDE 400 MG/5ML PO SUSP: 30.0000 mL | Freq: Every day | ORAL | Status: DC | PRN

## 2011-06-25 MED ORDER — OXYCODONE-ACETAMINOPHEN 5-325 MG PO TABS: 1.0000 | ORAL_TABLET | ORAL | Status: DC | PRN

## 2011-06-25 MED ORDER — MORPHINE SULFATE 4 MG/ML IJ SOLN: 4.0000 mg | INTRAMUSCULAR | Status: DC | PRN

## 2011-06-25 MED ORDER — GLYCOPYRROLATE 0.2 MG/ML IJ SOLN
INTRAMUSCULAR | Status: DC | PRN
  Administered 2011-06-25: 0.4 mg via INTRAVENOUS

## 2011-06-25 MED ORDER — PROPOFOL 10 MG/ML IV EMUL
INTRAVENOUS | Status: DC | PRN
  Administered 2011-06-25: 170 mg via INTRAVENOUS

## 2011-06-25 MED ORDER — LACTATED RINGERS IV SOLN
INTRAVENOUS | Status: DC | PRN
  Administered 2011-06-25 (×3): via INTRAVENOUS

## 2011-06-25 MED ORDER — ACETAMINOPHEN 10 MG/ML IV SOLN
INTRAVENOUS | Status: DC | PRN
  Administered 2011-06-25: 1000 mg via INTRAVENOUS

## 2011-06-25 MED ORDER — LISINOPRIL-HYDROCHLOROTHIAZIDE 20-25 MG PO TABS: 1.0000 | ORAL_TABLET | Freq: Every day | ORAL | Status: DC

## 2011-06-25 MED ORDER — LIDOCAINE HCL (CARDIAC) 20 MG/ML IV SOLN
INTRAVENOUS | Status: DC | PRN
  Administered 2011-06-25: 40 mg via INTRAVENOUS

## 2011-06-25 MED ORDER — PROMETHAZINE HCL 25 MG/ML IJ SOLN: 6.2500 mg | INTRAMUSCULAR | Status: DC | PRN

## 2011-06-25 MED ORDER — ONDANSETRON HCL 4 MG/2ML IJ SOLN
INTRAMUSCULAR | Status: DC | PRN
  Administered 2011-06-25: 4 mg via INTRAVENOUS

## 2011-06-25 MED ORDER — MENTHOL 3 MG MT LOZG
1.0000 | LOZENGE | OROMUCOSAL | Status: DC | PRN
  Filled 2011-06-25: qty 9

## 2011-06-25 MED ORDER — LISINOPRIL 20 MG PO TABS
20.0000 mg | ORAL_TABLET | Freq: Every day | ORAL | Status: DC
  Administered 2011-06-26: 20 mg via ORAL
  Filled 2011-06-25: qty 1

## 2011-06-25 MED ORDER — EPHEDRINE SULFATE 50 MG/ML IJ SOLN
INTRAMUSCULAR | Status: DC | PRN
  Administered 2011-06-25: 15 mg via INTRAVENOUS
  Administered 2011-06-25: 5 mg via INTRAVENOUS
  Administered 2011-06-25: 10 mg via INTRAVENOUS

## 2011-06-25 MED ORDER — PHENYLEPHRINE HCL 10 MG/ML IJ SOLN
INTRAMUSCULAR | Status: DC | PRN
  Administered 2011-06-25: 40 ug via INTRAVENOUS
  Administered 2011-06-25 (×2): 80 ug via INTRAVENOUS
  Administered 2011-06-25 (×2): 40 ug via INTRAVENOUS
  Administered 2011-06-25 (×3): 80 ug via INTRAVENOUS
  Administered 2011-06-25: 40 ug via INTRAVENOUS
  Administered 2011-06-25 (×2): 80 ug via INTRAVENOUS
  Administered 2011-06-25: 40 ug via INTRAVENOUS
  Administered 2011-06-25 (×2): 80 ug via INTRAVENOUS
  Administered 2011-06-25: 40 ug via INTRAVENOUS

## 2011-06-25 MED ORDER — ACETAMINOPHEN 10 MG/ML IV SOLN
1000.0000 mg | Freq: Four times a day (QID) | INTRAVENOUS | Status: AC
  Administered 2011-06-25 – 2011-06-26 (×3): 1000 mg via INTRAVENOUS
  Filled 2011-06-25 (×3): qty 100

## 2011-06-25 MED ORDER — SODIUM CHLORIDE 0.9 % IR SOLN
Status: DC | PRN
  Administered 2011-06-25: 10:00:00

## 2011-06-25 MED ORDER — MEPERIDINE HCL 25 MG/ML IJ SOLN: 6.2500 mg | INTRAMUSCULAR | Status: DC | PRN

## 2011-06-25 MED ORDER — BACITRACIN 50000 UNITS IM SOLR
INTRAMUSCULAR | Status: AC
  Filled 2011-06-25: qty 1

## 2011-06-25 SURGICAL SUPPLY — 53 items
ALLOGRAFT CA 6X14X11 (Bone Implant) ×6 IMPLANT
BAG DECANTER FOR FLEXI CONT (MISCELLANEOUS) ×2 IMPLANT
BIT DRILL NEURO 2X3.1 SFT TUCH (MISCELLANEOUS) ×1 IMPLANT
BLADE ULTRA TIP 2M (BLADE) ×2 IMPLANT
BRUSH SCRUB EZ PLAIN DRY (MISCELLANEOUS) ×2 IMPLANT
CANISTER SUCTION 2500CC (MISCELLANEOUS) ×2 IMPLANT
CLOTH BEACON ORANGE TIMEOUT ST (SAFETY) ×2 IMPLANT
CONT SPEC 4OZ CLIKSEAL STRL BL (MISCELLANEOUS) ×2 IMPLANT
COVER MAYO STAND STRL (DRAPES) ×2 IMPLANT
DECANTER SPIKE VIAL GLASS SM (MISCELLANEOUS) ×2 IMPLANT
DERMABOND ADVANCED (GAUZE/BANDAGES/DRESSINGS) ×2
DERMABOND ADVANCED .7 DNX12 (GAUZE/BANDAGES/DRESSINGS) ×2 IMPLANT
DRAPE LAPAROTOMY 100X72 PEDS (DRAPES) ×2 IMPLANT
DRAPE MICROSCOPE LEICA (MISCELLANEOUS) ×2 IMPLANT
DRAPE POUCH INSTRU U-SHP 10X18 (DRAPES) ×2 IMPLANT
DRAPE PROXIMA HALF (DRAPES) IMPLANT
DRILL NEURO 2X3.1 SOFT TOUCH (MISCELLANEOUS) ×2
ELECT COATED BLADE 2.86 ST (ELECTRODE) ×2 IMPLANT
ELECT REM PT RETURN 9FT ADLT (ELECTROSURGICAL) ×2
ELECTRODE REM PT RTRN 9FT ADLT (ELECTROSURGICAL) ×1 IMPLANT
GAUZE SPONGE 4X4 16PLY XRAY LF (GAUZE/BANDAGES/DRESSINGS) ×2 IMPLANT
GLOVE BIO SURGEON STRL SZ8 (GLOVE) ×2 IMPLANT
GLOVE BIOGEL PI IND STRL 8 (GLOVE) ×2 IMPLANT
GLOVE BIOGEL PI INDICATOR 8 (GLOVE) ×2
GLOVE ECLIPSE 7.5 STRL STRAW (GLOVE) ×12 IMPLANT
GLOVE EXAM NITRILE LRG STRL (GLOVE) IMPLANT
GLOVE EXAM NITRILE MD LF STRL (GLOVE) IMPLANT
GLOVE EXAM NITRILE XL STR (GLOVE) IMPLANT
GLOVE EXAM NITRILE XS STR PU (GLOVE) IMPLANT
GOWN BRE IMP SLV AUR LG STRL (GOWN DISPOSABLE) IMPLANT
GOWN BRE IMP SLV AUR XL STRL (GOWN DISPOSABLE) ×6 IMPLANT
GOWN STRL REIN 2XL LVL4 (GOWN DISPOSABLE) ×2 IMPLANT
HEAD HALTER (SOFTGOODS) ×2 IMPLANT
KIT BASIN OR (CUSTOM PROCEDURE TRAY) ×2 IMPLANT
KIT ROOM TURNOVER OR (KITS) ×2 IMPLANT
NEEDLE HYPO 25X1 1.5 SAFETY (NEEDLE) ×2 IMPLANT
NEEDLE SPNL 22GX3.5 QUINCKE BK (NEEDLE) ×2 IMPLANT
NS IRRIG 1000ML POUR BTL (IV SOLUTION) ×2 IMPLANT
PACK LAMINECTOMY NEURO (CUSTOM PROCEDURE TRAY) ×2 IMPLANT
PAD ARMBOARD 7.5X6 YLW CONV (MISCELLANEOUS) ×6 IMPLANT
PATTIES SURGICAL 1X1 (DISPOSABLE) ×2 IMPLANT
RUBBERBAND STERILE (MISCELLANEOUS) ×4 IMPLANT
SPONGE INTESTINAL PEANUT (DISPOSABLE) ×4 IMPLANT
SPONGE SURGIFOAM ABS GEL 100 (HEMOSTASIS) ×2 IMPLANT
STAPLER SKIN PROX WIDE 3.9 (STAPLE) ×2 IMPLANT
SUT VIC AB 0 CT1 18XCR BRD8 (SUTURE) IMPLANT
SUT VIC AB 0 CT1 8-18 (SUTURE)
SUT VIC AB 2-0 CP2 18 (SUTURE) ×4 IMPLANT
SUT VIC AB 3-0 SH 8-18 (SUTURE) ×2 IMPLANT
SYR 20ML ECCENTRIC (SYRINGE) ×2 IMPLANT
TOWEL OR 17X24 6PK STRL BLUE (TOWEL DISPOSABLE) ×2 IMPLANT
TOWEL OR 17X26 10 PK STRL BLUE (TOWEL DISPOSABLE) ×2 IMPLANT
WATER STERILE IRR 1000ML POUR (IV SOLUTION) ×2 IMPLANT

## 2011-06-25 NOTE — Progress Notes (Signed)
Orthopedic Tech Progress Note Patient Details:  Sean Green 1956-01-20 [id number]  Ortho Devices Type of Ortho Device: Soft collar Ortho Device/Splint Interventions: Application   Jennye Moccasin 06/25/2011, 2:40 PM

## 2011-06-25 NOTE — Preoperative (Signed)
Beta Blockers   Reason not to administer Beta Blockers:Not Applicable 

## 2011-06-25 NOTE — Op Note (Signed)
06/25/2011  12:56 PM  PATIENT:  Sean Green  56 y.o. male  PRE-OPERATIVE DIAGNOSIS:  cervical spondylosis cervical degenerative disc disease cervical radiculopathy  POST-OPERATIVE DIAGNOSIS:  cervical spondylosis cervical degenerative disc disease cervical radiculopathy  PROCEDURE:  Procedure(s): ANTERIOR CERVICAL DECOMPRESSION/DISCECTOMY FUSION 3 LEVELS: C4-5, C5-6, and C6-7 anterior cervical decompression and arthrodesis with allograft and tether cervical plating  SURGEON:  Surgeon(s): Hewitt Shorts, MD Tia Alert, MD  ASSISTANTS:  Marikay Alar, M.D.  ANESTHESIA:   general  EBL:  Total I/O In: 2500 [I.V.:2500] Out: 340 [Urine:140; Blood:200]  BLOOD ADMINISTERED:none  COU oldNT: Correct per nursing staff  DICTATION: Patient was brought to the operating room placed under general endotracheal anesthesia. Patient was placed in 10 pounds of halter traction. The neck was prepped with Betadine soap and solution and draped in a sterile fashion. A obliquel incision was made on the left side of the neck paralleling the anterior border of the sternocleidomastoid.. The line of the incision was infiltrated with local anesthetic with epinephrine. Dissection was carried down thru the subcutaneous tissue and platysma, bipolar cautery was used to maintain hemostasis. Dissection was then carried out thru an avascular plane leaving the sternocleidomastoid carotid artery and jugular vein laterally and the trachea and esophagus medially. The ventral aspect of the vertebral column was identified and a localizing x-ray was taken. The C4-5, C5-6, and C6-7 levels were identified. The annulus at each level was incised and the disc space entered. Discectomy was performed with micro-curettes and pituitary rongeurs. The operating microscope was draped and brought into the field provided additional magnification illumination and visualization. Discectomy was continued posteriorly thru the disc space and then  the cartilaginous endplate was removed using micro-curettes along with the high-speed drill. Posterior osteophytic overgrowth was removed at each level using the high-speed drill along with a 2 mm thin footplated Kerrison punch. Posterior longitudinal ligament along with spondylytic overgrowth was carefully removed, decompressing the spinal canal and thecal sac. We then continued to remove osteophytic overgrowth and disc material decompressing the neural foramina and exiting nerve roots bilaterally. Once the decompression was completed hemostasis was established at each level with the use of Gelfoam with thrombin and bipolar cautery. The Gelfoam was removed the wound irrigated and hemostasis confirmed. We then measured the height of each intravertebral disc space level and selected a 6 millimeter in height structural allograft for the C4-5 level, a 6 millimeter in height structural allograft for the C5-6 level, and a 6 millimeter in height structural allograft for the C6-7 level . Each was hydrated in saline solution and then gently positioned in the intravertebral disc space and countersunk. We then selected a 46 millimeter in height Tether cervical plate. It was positioned over the fusion construct and secured to the vertebra with a pair of   4 x 15 mm variable screws C4 and C7, and single 4 x 12 mm fixed screws at C5 and C6. Each screw hole was started with the high-speed drill and then the screws placed, once all the screws were placed final tightening was performed. The wound was irrigated with bacitracin solution checked for hemostasis which was established and confirmed. An x-ray was taken which was limited visualization, because of the patient's large shoulders, but showed  screws in good position at the C4 and C5 level, the overall alignment looked good, and the plate to look into position. Further under direct visualization each of the grafts, and all of the screws as well as the plate appeared  to be in  good position.We then proceeded with closure. The platysma was closed with interrupted inverted 2-0 undyed Vicryl suture, the subcutaneous and subcuticular closed with interrupted inverted 3-0 undyed Vicryl suture. The skin edges were approximated with Dermabond. Following surgery the patient was taken out of cervical traction. To be reversed and the anesthetic and taken to the recovery room for further care.   PLAN OF CARE: Admit for overnight observation  PATIENT DISPOSITION:  PACU - hemodynamically stable.   Delay start of Pharmacological VTE agent (>24hrs) due to surgical blood loss or risk of bleeding:  yes

## 2011-06-25 NOTE — Transfer of Care (Signed)
Immediate Anesthesia Transfer of Care Note  Patient: Sean Green  Procedure(s) Performed: Procedure(s) (LRB): ANTERIOR CERVICAL DECOMPRESSION/DISCECTOMY FUSION 3 LEVELS (N/A)  Patient Location: PACU  Anesthesia Type: General  Level of Consciousness: awake, oriented, patient cooperative and responds to stimulation  Airway & Oxygen Therapy: Patient Spontanous Breathing and Patient connected to face mask oxygen  Post-op Assessment: Report given to PACU RN, Post -op Vital signs reviewed and stable and Patient moving all extremities X 4  Post vital signs: Reviewed and stable  Complications: No apparent anesthesia complications

## 2011-06-25 NOTE — H&P (Signed)
Sean Green. Golding   DOB:  [birth year]    HISTORY OF PRESENT ILLNESS:  The patient is a 56 year old right-handed white male who is seen in neurosurgical consultation at the request of Dr. Mila Palmer from [facility] [place].    The patient explains that his difficulties began in November 2012 with pain in the left shoulder subsequently extending down to the left arm and forearm.  It has been associated with numbness and paresthesias to the left arm, forearm and hand and particularly to the first and second digits of the left hand.    He had a history of previous right rotator cuff problem which required surgery by Dr. Hayden Rasmussen.  He returned to Dr. Thomasena Edis and was evaluated.  Dr. Thomasena Edis felt that there were problems both in the left shoulder as well as the neck, and he was referred to Dr. Ethelene Hal and set up for physical therapy.  He did therapy for about three weeks but had no improvement.  He was studied by Dr. Ethelene Hal with MRI scans of the neck at [facility] and of the left shoulder at [facility].  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   MRI showed evidence of tendonitis but no rotator cuff tear.    Dr. Ethelene Hal discussed with him the alternatives of cervical spinal injections or surgery and the patient did consult with Dr. Venita Lick but Dr. Shon Baton favored further nonsurgical management.    The patient then followed up with his primary physician, Dr. Mila Palmer at [facility] and neurosurgical consultation was requested.  The patient is now seen for such.    Symptomatically, he continues to have neck pain as well as pain radiating through the left shoulder, arm and forearm, numbness and paresthesias to the left arm, forearm and hand into the first and second digits of the left hand.  He does describe vertigo that he has been experiencing with changes in his head and neck  position.  He has a sense of weakness in the left upper extremity.  He has been wearing a soft collar for the past month and it does lessen the pain and discomfort and has continued to use it.  He has been out of work per the physicians at [employer] since January of this year.    PAST MEDICAL HISTORY:  Notable for history of hypertension.  He reports a myocardial infarction three years ago.  He underwent a cardiac catheterization but did not require coronary stenting or open heart surgery.  No history of stroke, diabetes, peptic ulcer disease, cancer or lung disease.  Previous surgeries include right rotator cuff surgery with Dr. Thomasena Edis in 2002, and right hip replacement in 2008 at [facility].  He denies allergies to medications.   Current medications include Amlodipine 10 mg. q.d., Lisinopril/Hydrochlorothiazide 20/25 q.d., Citalopram 40 mg. q.d., aspirin 81 mg. q.d, Melatonin 3 mg. b.i.d., Methocarbamol 500 mg. t.i.d. p.r.n. muscle spasms, Norco 5/325 p.r.n. but used infrequently.   FAMILY HISTORY:    Parents have passed on.  Mother died at age 21.  She had hypertension, thyroid disease and a stroke.  Father died at age 33. He had hypertension and diabetes.  There is also a family history of cancer.  SOCIAL HISTORY:    The patient is divorced.  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  He does not smoke.  He drinks alcoholic beverages socially. He denies  a history of substance abuse.  REVIEW OF SYSTEMS:   Notable for those difficulties described in the History of Present Illness and Past Medical History but a 14-point Review of Systems sheet is otherwise unremarkable.     PHYSICAL EXAMINATION:  The patient is a well developed, well nourished white male in discomfort but no acute distress.  Ht. 6'1". Wt. 280 pounds.  He is afebrile. Lungs are clear to auscultation.  He has symmetrical respiratory excursion.  Heart has a regular rate and rhythm.   Normal S1 and S2.  No murmur.  Extremity examination shows no clubbing, cyanosis or edema.  Musculoskeletal examination shows no tenderness to palpation over the cervical spinous processes or paracervical musculature.  Range of motion of the neck is somewhat limited due to pain essentially in all directions including flexion, extension, lateral flexion to either side.  The patient has mild tenderness over the left acromioclavicular joint, no tenderness over the right acromioclavicular joint.  There are mechanical signs of impingement in the left shoulder and none in the right shoulder on exam.    NEUROLOGICAL EXAMINATION: Shows 5/5 strength to the upper extremities including the deltoids, biceps, triceps, intrinsics and grip although he has difficulty exerting full effort with the left upper extremity due to pain.  Sensory examination shows intact to pin prick through the digits of the upper extremities.  Reflexes are minimal in the biceps, triceps, brachioradialis, trace in the quadriceps and minimal in the gastrocnemii and symmetrical bilaterally.  Toes are downgoing bilaterally.  He has a normal gait and stance.  DIAGNOSTIC STUDIES:   X-rays were done today in the office and we reviewed his MRI done at [facility] by CD-ROM disk.     STUDY:     Cervical spine x-rays. VIEWS:     AP, lateral, flexion, extension, obliques open-mouth and swimmer's (8 views). INDICATIONS:    Neck and left cervical radiculopathy.   FINDINGS:     The patient has evidence of multilevel degenerative disc disease and spondylosis with disc space narrowing and ventral and dorsal spurring at C4-5, C5-6 and C6-7 more so that C3-4.  Alignment is stable through flexion and extension.  Oblique images show osteophytic neural foraminal encroachment bilaterally at C4-5, C5-6 and C6-7 more so than C3-4.    MRI scan shows spondylitic disc herniation with underlying spondylosis and degenerative disc disease at C4-5, C5-6 and C6-7  again more so that C3-4.  No alteration of cord signal.  IMPRESSION:    Patient with neck pain and left cervical radiculopathy with advanced multilevel degenerative disc disease and spondylosis worse at the C4-5, C5-6 and C6-7 levels certainly much more mild at the C3-4 level.  Intact strength and sensation. No pathologic reflexes.   I do believe that he has left rotator cuff tendonitis symptomatically and by exam as well.   RECOMMENDATIONS:   I discussed my assessment and impression with the patient and reviewed his x-rays and MRI scan with him.  I have also reviewed the report of his left shoulder MRI.    I feel that he has problems in both the neck and left shoulder although it may well be that more symptoms are arising from the cervical spine but in the long run he may need further evaluation, treatment and care of the left rotator cuff syndrome.  We discussed alternatives for continued nonsurgical measures versus surgical intervention which would require 3-level C4-5, C5-6 and C6-7 anterior cervical decompression and arthrodesis with allograft and cervical plating.  He explains to me that he has been out of work since January and he does not feel that he has made sufficient progress with nonsurgical measures over the past six months and therefore he would like to proceed with surgery.  I discussed the nature of surgery, typical length of surgery, hospital stay and overall recuperation, his limitations postoperatively, the need for postop immobilization in a soft collar   and risks of surgery including risks of infection, bleeding, possible need for transfusion, risk of nerve dysfunction, pain, numbness, weakness, paresthesias, the risk of spinal cord dysfunction, paralysis of all four limbs, quadriplegia, the risk of failure of the arthrodesis and possible need for further surgery, and the anesthetic risks of myocardial infarction, stroke, pneumonia and death.  We also discussed the risks of  esophageal and/or laryngeal dysfunction.  Understanding all of this he would like to go ahead with surgery.   We offered to schedule surgery for next week.  He feels that he is going to need more time to make arrangements to have some assistance during the postoperative period and with that in mind we are going to set surgery up for about 4 weeks from now. His x-rays are on [place]. His MRI is on a CD-ROM in my office.  During the postoperative period if there remains significant symptoms into the left shoulder, we may ask for further assistance from Dr. Thomasena Edis.    Lastly, the patient does have a soft collar already which he is wearing.  I instructed him to bring that with him to the hospital on the day of surgery and he will wear it postoperatively for 6 weeks or so.    As far as his work status, he has asked for a note to continue out of work and we will give him a note to be out of work until August 21, 2011.  [facility]         Hewitt Shorts, M.D.

## 2011-06-25 NOTE — Anesthesia Preprocedure Evaluation (Addendum)
Anesthesia Evaluation  Patient identified by MRN, date of birth, ID band Patient awake    Reviewed: Allergy & Precautions, H&P , NPO status , Patient's Chart, lab work & pertinent test results, reviewed documented beta blocker date and time   Airway Mallampati: II TM Distance: >3 FB Neck ROM: Limited    Dental No notable dental hx. (+) Teeth Intact and Dental Advisory Given   Pulmonary pneumonia ,  breath sounds clear to auscultation  Pulmonary exam normal       Cardiovascular hypertension, Pt. on medications + CAD (cath '11: non-obstructive ASCADz, normal LVF) - Past MI Rhythm:Regular Rate:Normal  '11 ECHO: normal LVF, normal valves    Neuro/Psych negative psych ROS   GI/Hepatic negative GI ROS, Neg liver ROS,   Endo/Other  Morbid obesity  Renal/GU negative Renal ROS  negative genitourinary   Musculoskeletal negative musculoskeletal ROS (+)   Abdominal (+) + obese,   Peds negative pediatric ROS (+)  Hematology negative hematology ROS (+)   Anesthesia Other Findings   Reproductive/Obstetrics negative OB ROS                         Anesthesia Physical Anesthesia Plan  ASA: III  Anesthesia Plan: General   Post-op Pain Management:    Induction: Intravenous  Airway Management Planned: Oral ETT and Video Laryngoscope Planned  Additional Equipment:   Intra-op Plan:   Post-operative Plan: Extubation in OR  Informed Consent: I have reviewed the patients History and Physical, chart, labs and discussed the procedure including the risks, benefits and alternatives for the proposed anesthesia with the patient or authorized representative who has indicated his/her understanding and acceptance.   Dental advisory given  Plan Discussed with: CRNA and Surgeon  Anesthesia Plan Comments: (Plan routine monitors, GETA with VideoGlide intubation)       Anesthesia Quick Evaluation

## 2011-06-25 NOTE — Progress Notes (Signed)
Filed Vitals:   06/25/11 1430 06/25/11 1445 06/25/11 1505 06/25/11 1641  BP: 117/69 126/72 117/73 131/84  Pulse: 93 100 99 111  Temp:  98.3 F (36.8 C) 97.8 F (36.6 C) 97.3 F (36.3 C)  TempSrc:      Resp: 14 15 16 16   SpO2: 97% 97% 94% 96%     Patient up and ambulating in halls. Wound clean and dry. In soft cervical collar. Patient has voided since Foley was discontinued but a PVR was 300 cc, and we will continue to monitor his voiding function with bladder scan is to check PVRs. Encouraged continued and the actively in the halls. Taking well by mouth.  Plan: Continue through postoperative recovery course.  Hewitt Shorts, MD 06/25/2011, 6:38 PM

## 2011-06-25 NOTE — Anesthesia Postprocedure Evaluation (Signed)
  Anesthesia Post-op Note  Patient: Sean Green  Procedure(s) Performed: Procedure(s) (LRB): ANTERIOR CERVICAL DECOMPRESSION/DISCECTOMY FUSION 3 LEVELS (N/A)  Patient Location: PACU  Anesthesia Type: General  Level of Consciousness: awake, alert  and oriented  Airway and Oxygen Therapy: Patient Spontanous Breathing and Patient connected to nasal cannula oxygen  Post-op Pain: mild  Post-op Assessment: Post-op Vital signs reviewed, Patient's Cardiovascular Status Stable, Respiratory Function Stable, Patent Airway, No signs of Nausea or vomiting and Pain level controlled  Post-op Vital Signs: Reviewed and stable  Complications: No apparent anesthesia complications

## 2011-06-26 MED ORDER — HYDROCODONE-ACETAMINOPHEN 5-325 MG PO TABS: 1.0000 | ORAL_TABLET | ORAL | Status: AC | PRN

## 2011-06-26 NOTE — Progress Notes (Signed)
Utilization Review Completed.Sean Green T6/06/2011   

## 2011-06-26 NOTE — Discharge Instructions (Signed)

## 2011-06-26 NOTE — Plan of Care (Signed)
Problem: Consults Goal: Diagnosis - Spinal Surgery Outcome: Completed/Met Date Met:  06/26/11 Cervical Spine Fusion

## 2011-06-26 NOTE — Progress Notes (Signed)
Pt. Tolerated procedure well. Pt. Alert and oriented,follows simple instructions, denies pain. Incision area without swelling, redness or S/S of infection. Voiding adequate clear yellow urine. Moving all extremities well and vitals stable and documented. Anterior cervical Fusion surgery notes instructions given to patient and family member for home safety and precautions.Pt and family stated understanding of instructions given.

## 2011-06-26 NOTE — Discharge Summary (Signed)
Physician Discharge Summary  Patient ID: Sean Green MRN: [id number] DOB/AGE: [birth year] 56 y.o.  Admit date: 06/25/2011 Discharge date: 06/26/2011  Admission Diagnoses: Cervical spondylosis, cervical degenerative disc disease, cervical radiculopathy  Discharge Diagnoses: Cervical spondylosis, cervical degenerative disc disease, cervical radiculopathy  Discharged Condition: good  Hospital Course: Patient was admitted underwent a three-level ACDF. He is done well following surgery. He is up and living. He is voiding well. Incision is clean and dry. He is being discharged home with instructions regarding wound care and activities. He is to return for followup with me in 3 weeks.  Discharge Exam: Blood pressure 130/86, pulse 102, temperature 97.9 F (36.6 C), temperature source Oral, resp. rate 20, SpO2 96.00%.  Disposition: Home   Medication List  As of 06/26/2011 10:53 AM   TAKE these medications         ALPRAZolam 0.5 MG tablet   Commonly known as: XANAX   Take 0.5 mg by mouth as needed.      amLODipine 5 MG tablet   Commonly known as: NORVASC   Take 5 mg by mouth daily.      aspirin 81 MG tablet   Take 81 mg by mouth daily.      citalopram 40 MG tablet   Commonly known as: CELEXA   Take 40 mg by mouth daily.      HYDROcodone-acetaminophen 5-325 MG per tablet   Commonly known as: NORCO   Take 1 tablet by mouth every 6 (six) hours as needed. For pain      HYDROcodone-acetaminophen 5-325 MG per tablet   Commonly known as: NORCO   Take 1-2 tablets by mouth every 4 (four) hours as needed for pain.      lisinopril-hydrochlorothiazide 20-25 MG per tablet   Commonly known as: PRINZIDE,ZESTORETIC   Take 1 tablet by mouth daily.      MELATONIN PO   Take 6 mg by mouth at bedtime as needed.      methocarbamol 500 MG tablet   Commonly known as: ROBAXIN   Take 500 mg by mouth every 6 (six) hours as needed. For muscle pain      PATADAY 0.2 % Soln   Generic drug: Olopatadine  HCl   Place 1 drop into both eyes daily as needed.      tadalafil 5 MG tablet   Commonly known as: CIALIS   Take 5 mg by mouth daily as needed.             Signed: Hewitt Shorts, MD 06/26/2011, 10:53 AM

## 2011-06-27 ENCOUNTER — Encounter (HOSPITAL_COMMUNITY): Payer: Self-pay | Admitting: Neurosurgery

## 2011-12-08 NOTE — Progress Notes (Signed)
Received labs results from [facility]; forwarded to Dr. Gaylyn Rong.

## 2013-04-29 ENCOUNTER — Encounter: Payer: Self-pay | Admitting: Urology

## 2013-04-29 ENCOUNTER — Ambulatory Visit: Payer: BLUE CROSS/BLUE SHIELD | Attending: Urology | Admitting: Urology

## 2013-04-29 VITALS — BP 126/71 | HR 79 | Temp 97.9°F

## 2013-04-29 DIAGNOSIS — N529 Male erectile dysfunction, unspecified: Secondary | ICD-10-CM | POA: Insufficient documentation

## 2013-04-29 DIAGNOSIS — N486 Induration penis plastica: Principal | ICD-10-CM | POA: Insufficient documentation

## 2013-04-29 NOTE — Progress Notes (Signed)
Primary Care Provider: Magda Kiel     Re: Colton Krueger, Colton Krueger  MRN# [id number]  DOB: 1955/04/09  DOV: 04/29/2013    Dear [name] [name]    I had the pleasure of seeing your patient Colton Krueger at the [facility] faculty practice on 04/29/2013. As you recall, this is a 59yr old male who is here to discuss lateral curvature of the penis.  The patient first noted this problem for approximately 1 year.  He feels it may have occurred once before where a painful spot with wasting developed and resolved spontaneously about 25 years ago. His current curvature has been stable for one year. This curvature was painful intially, but no longer with pain at baseline or during erections.  He feels it evolved prior this over a 2 month period. He reports left lateral curvature of about 30-40 degress. He also reports decreased libido.  He is having difficulty maintaining erections.  He got remarried this past November.  Denies difficulty penetrating due to curvature, no pain with intercourse.  Denies that partner has expressed pain from this.  He reports a "knot" along the penis.  Prior to evaluation in our office, he has not attempted any treatment. No treatments for ED other than he believes a testosterone cream which he just started 2 days ago. He thinks his testosterone level recently checked was approximately 400.   He is here to discuss management options.    Additionally, reports a growth superior to the left testicle that is mildly tender with manipulation.  He does report this has been present for 5 years and an ultrasound done 5 years ago was reportedly negative.  It has not changed in size, but is slightly more tender.      Allergies:   Nsaids (Non-Steroidal Anti-Inflammatory Drug)    Other-Reaction in Comments    Comment:PT HAS CARDIAC STENTS IN PLACE and on EFFIENT  Pcn (Penicillin) [Penicillins]    Rash    PMH:   CAD, cardiac stenting x2  Neck pain  Foot pain  HLD  ADHD  Depression   Severe nasal bleed after taking NSAIDS  OSA    PSH:   Cardiac stenting x2      Family History:  Adopted       Current Outpatient Prescriptions   Medication Sig Dispense Refill    Ascorbic Acid (VITAMIN C/BIOFLAVONOIDS) 1,000 mg Tablet Take 1,000 mg by mouth every day.        Aspirin 81 mg Chewable Tablet Chew and swallow 81 mg by mouth every day.        B Complex Vitamins (B COMPLEX 1) Tablet Take 1 tablet by mouth every morning.        BuPROPion (WELLBUTRIN SR) 100 mg SR 12hr Tablet Take 100 mg by mouth every day.        Carisoprodol 250 mg Tablet Take 1 tablet by mouth three times daily if needed.        Cholecalciferol, Vitamin D3, 5,000 unit Capsule Take 1 capsule by mouth every day.        Citalopram (CELEXA) 20 mg Tablet Take 20 mg by mouth every morning.        CO-ENZYME Q-10 (CO Q-10) 100 mg Capsule Take 1 tablet by mouth every day.        Ezetimibe (ZETIA) 10 mg Tablet Take 10 mg by mouth every day.        GARLIC 600 mg Tablet Take 1 capsule by mouth every day.  Hydrocodone 10 mg/Acetaminophen 325 mg (NORCO 10) 10-325 mg per tablet Take 1-2 tablets by mouth every 4 to 6 hours if needed.        niacin 750 mg ER Tablet Take 1 tablet by mouth every day.        Omega-3 Acid Ethyl Esters (LOVAZA) 1 gram Capsule Take 4,000 mg by mouth every day.        Omeprazole (PRILOSEC) 40 mg Delayed Release Capsule Take 40 mg by mouth once daily before a meal.        Prasugrel (EFFIENT) 10 mg Tablet Take 10 mg by mouth every morning.        Ramipril (ALTACE) 2.5 mg Capsule Take 2.5 mg by mouth every day.        testosterone 10 mg/0.5 gram /actuation Gel in Metered-Dose Pump Apply 1 Pump to the affected area 2 times daily.         No current facility-administered medications for this visit.     Social:  Married  Past smoker, quit 2012    Review of systems:   Constitutional: negative.  Eyes: negative.  Ears: negative.  Nose/Sinus: negative.  Mouth/Pharynx: negative.  Cardiovascular: negative.   Respiratory: negative.  Gastrointestinal: negative.  Musculoskeletal: negative.  Skin: negative.  Neurologic: negative.  Psych: Mood pt's report, euthymic.  Genital/Uro: as described in HPI.    Physical Exam:    Temp: 36.6 C (97.9 F) (04/10 1202)  Temp src: Oral (04/10 1202)  Pulse: 79 (04/10 1202)  BP: 126/71 mmHg (04/10 1202)  Resp: --  SpO2: --  Height: --  Weight: --      There is no height or weight on file to calculate BMI.  General Appearance: in no apparent distress   Skin: No abnormal nevi, normal hair distribution  He is alert and oriented times three.    He has a normal affect.    HEENT: normocephalic, atraumatic   Eyes: Anicteric  Mucous Membranes: Moist   Chest: no gynecomastia  Abdomen: Abdomen is soft, non-tender and non-distended. The patient has no surgical scars  GU:      Penis:  circumcised phallus, normal meatus.  There is a firm palpable lesion at left base ~0.5-1cm.   Scrotum: Testes normal to inspection, symmetric and descended.  No evidence of inguinal hernia    Scrotal Skin: Normal to inspection.  No condyloma or nevi. Normal adult pubic hair distribution                Testicles: normal exam, testes symmetric and without lesion.  The left epididymis is mildly prominent and tender c/w epididymal cyst  Neuro: normal without focal findings, mental status, speech normal, alert and oriented x iii, PERLA, reflexes normal and symmetric.   Extremities: No clubbing, cyanosis, or edema    LABORATORY STUDIES    None available    ASSESSMENT: 28yr male with Peyronie's Disease and 30-40 degree left lateral curvature.  Subtle left sided plaque.  Mild to moderate erectile dysfunction with trouble maintaining erections.      I discussed with the patient our evaluation and management of Peyronie's disease.  PD in most cases involves development of a tunical plaque which may impede corporal expansion during erection.  In cases where the plaque  is particular large or even circumfrential there may be hinging or waist defect.  The condition is thought to be related to trauma, most often mild subclinical trauma occuring with no distinct recollection of the event.  The deformity may  occur in the presence or absence of pain, although pain is typically a self-limited component of the disorder.    Most recent estimates suggest that spontaneous improvement occurs in 10-15% of cases, with the remainder of patients experiencing stable or worsening disease.  Typically the likelihood of progression declines with chronicity.    In situations where deformity is mild some men opt to defer treatment altogether; this is a reasonable option in cases where satisfactory sexual activity is still possible.  In cases where this is not an option, conservative management with an oral medication such as pentoxifylline may be contemplated.  Typically, the improvements noted with these treatments are mild.     Injection of collagenase has recently been approved by the FDA for management of symptomatic Peyronie's Disease with non-ventral curvature.  This option will likely replace older management options including verapamil or interferon.  Collagenase is given as a series of two injections followed by penile modelling in the office; the patient is then to perform at home modelling to facilitate healing of the penis in a less curved fashion.  The average improvement in curvature for men treated with collagenase is about 17 degrees; risks including pain, bruising, bleeding, failure to improve curvature, and penile fracture.    Surgical management of PD can be accomplished with penile plication, tunica patch grafting, or, in cases where there is concomitant moderate/severe erectile dysfunction, placement of a penile prosthetic device.  Surgical management is generally reserved for patients who have had stable disease for at least 6 months, although the evidence to support  this practice is based primarily on expert opinion.  Plication carries the least risk of complications but may lead to penile foreshortening, whereas patch grafting carries a great risk of subsequent erectile dysfunction.    I emphasized to the patient that making healthy lifestyle choices with respect to diet, exercise, and body weight has been shown to slow progression of sexual problems and in some cases even reverse them.  I recommended that he consider his diet, exercise, and stress management routines and ways he might improve his general health.  This may be of particular benefit with respect to his erectile function.     After discussing the above, the patient wishes to discuss options with his spouse, and he will contact us if he is interested in further intervention.      Once again, thank you for requesting this consultation. Please do not hesitate to contact me with any questions or concerns.    [facility] Interpreter used: no    I spent 30 minutes with the patient, 20 minutes of which were spent counseling the patient on erectile dysfunction and Peyronie's Disease    The patient indicates understanding of these issues and agrees with the plan.    This patient was seen and evaluated with Dr. Reyne DumasGenevieve Sharon Stapel. The care plan was developed with the resident. I agree with the findings and plan as outlined in the resident's note above.    Electronically signed    Latanya MaudlinAlan W. Shindel, MD  Associate Professor in Residence  Department of Urology  PI #: [id number]  Pager: [phone]

## 2013-04-29 NOTE — Communication Body (Signed)
Primary Care Provider: Magda Kiel     Re: Colton, Krueger  MRN# [id number]  DOB: 1955/04/09  DOV: 04/29/2013    Dear [name] [name]    I had the pleasure of seeing your patient Colton Krueger at the [facility] faculty practice on 04/29/2013. As you recall, this is a 59yr old male who is here to discuss lateral curvature of the penis.  The patient first noted this problem for approximately 1 year.  He feels it may have occurred once before where a painful spot with wasting developed and resolved spontaneously about 25 years ago. His current curvature has been stable for one year. This curvature was painful intially, but no longer with pain at baseline or during erections.  He feels it evolved prior this over a 2 month period. He reports left lateral curvature of about 30-40 degress. He also reports decreased libido.  He is having difficulty maintaining erections.  He got remarried this past November.  Denies difficulty penetrating due to curvature, no pain with intercourse.  Denies that partner has expressed pain from this.  He reports a "knot" along the penis.  Prior to evaluation in our office, he has not attempted any treatment. No treatments for ED other than he believes a testosterone cream which he just started 2 days ago. He thinks his testosterone level recently checked was approximately 400.   He is here to discuss management options.    Additionally, reports a growth superior to the left testicle that is mildly tender with manipulation.  He does report this has been present for 5 years and an ultrasound done 5 years ago was reportedly negative.  It has not changed in size, but is slightly more tender.      Allergies:   Nsaids (Non-Steroidal Anti-Inflammatory Drug)    Other-Reaction in Comments    Comment:PT HAS CARDIAC STENTS IN PLACE and on EFFIENT  Pcn (Penicillin) [Penicillins]    Rash    PMH:   CAD, cardiac stenting x2  Neck pain  Foot pain  HLD  ADHD  Depression   Severe nasal bleed after taking NSAIDS  OSA    PSH:   Cardiac stenting x2      Family History:  Adopted       Current Outpatient Prescriptions   Medication Sig Dispense Refill    Ascorbic Acid (VITAMIN C/BIOFLAVONOIDS) 1,000 mg Tablet Take 1,000 mg by mouth every day.        Aspirin 81 mg Chewable Tablet Chew and swallow 81 mg by mouth every day.        B Complex Vitamins (B COMPLEX 1) Tablet Take 1 tablet by mouth every morning.        BuPROPion (WELLBUTRIN SR) 100 mg SR 12hr Tablet Take 100 mg by mouth every day.        Carisoprodol 250 mg Tablet Take 1 tablet by mouth three times daily if needed.        Cholecalciferol, Vitamin D3, 5,000 unit Capsule Take 1 capsule by mouth every day.        Citalopram (CELEXA) 20 mg Tablet Take 20 mg by mouth every morning.        CO-ENZYME Q-10 (CO Q-10) 100 mg Capsule Take 1 tablet by mouth every day.        Ezetimibe (ZETIA) 10 mg Tablet Take 10 mg by mouth every day.        GARLIC 600 mg Tablet Take 1 capsule by mouth every day.  Hydrocodone 10 mg/Acetaminophen 325 mg (NORCO 10) 10-325 mg per tablet Take 1-2 tablets by mouth every 4 to 6 hours if needed.        niacin 750 mg ER Tablet Take 1 tablet by mouth every day.        Omega-3 Acid Ethyl Esters (LOVAZA) 1 gram Capsule Take 4,000 mg by mouth every day.        Omeprazole (PRILOSEC) 40 mg Delayed Release Capsule Take 40 mg by mouth once daily before a meal.        Prasugrel (EFFIENT) 10 mg Tablet Take 10 mg by mouth every morning.        Ramipril (ALTACE) 2.5 mg Capsule Take 2.5 mg by mouth every day.        testosterone 10 mg/0.5 gram /actuation Gel in Metered-Dose Pump Apply 1 Pump to the affected area 2 times daily.         No current facility-administered medications for this visit.     Social:  Married  Past smoker, quit 2012    Review of systems:   Constitutional: negative.  Eyes: negative.  Ears: negative.  Nose/Sinus: negative.  Mouth/Pharynx: negative.  Cardiovascular: negative.   Respiratory: negative.  Gastrointestinal: negative.  Musculoskeletal: negative.  Skin: negative.  Neurologic: negative.  Psych: Mood pt's report, euthymic.  Genital/Uro: as described in HPI.    Physical Exam:    Temp: 36.6 C (97.9 F) (04/10 1202)  Temp src: Oral (04/10 1202)  Pulse: 79 (04/10 1202)  BP: 126/71 mmHg (04/10 1202)  Resp: --  SpO2: --  Height: --  Weight: --      There is no height or weight on file to calculate BMI.  General Appearance: in no apparent distress   Skin: No abnormal nevi, normal hair distribution  He is alert and oriented times three.    He has a normal affect.    HEENT: normocephalic, atraumatic   Eyes: Anicteric  Mucous Membranes: Moist   Chest: no gynecomastia  Abdomen: Abdomen is soft, non-tender and non-distended. The patient has no surgical scars  GU:      Penis:  circumcised phallus, normal meatus.  There is a firm palpable lesion at left base ~0.5-1cm.   Scrotum: Testes normal to inspection, symmetric and descended.  No evidence of inguinal hernia    Scrotal Skin: Normal to inspection.  No condyloma or nevi. Normal adult pubic hair distribution                Testicles: normal exam, testes symmetric and without lesion.  The left epididymis is mildly prominent and tender c/w epididymal cyst  Neuro: normal without focal findings, mental status, speech normal, alert and oriented x iii, PERLA, reflexes normal and symmetric.   Extremities: No clubbing, cyanosis, or edema    LABORATORY STUDIES    None available    ASSESSMENT: 28yr male with Peyronie's Disease and 30-40 degree left lateral curvature.  Subtle left sided plaque.  Mild to moderate erectile dysfunction with trouble maintaining erections.      I discussed with the patient our evaluation and management of Peyronie's disease.  PD in most cases involves development of a tunical plaque which may impede corporal expansion during erection.  In cases where the plaque  is particular large or even circumfrential there may be hinging or waist defect.  The condition is thought to be related to trauma, most often mild subclinical trauma occuring with no distinct recollection of the event.  The deformity may  occur in the presence or absence of pain, although pain is typically a self-limited component of the disorder.    Most recent estimates suggest that spontaneous improvement occurs in 10-15% of cases, with the remainder of patients experiencing stable or worsening disease.  Typically the likelihood of progression declines with chronicity.    In situations where deformity is mild some men opt to defer treatment altogether; this is a reasonable option in cases where satisfactory sexual activity is still possible.  In cases where this is not an option, conservative management with an oral medication such as pentoxifylline may be contemplated.  Typically, the improvements noted with these treatments are mild.     Injection of collagenase has recently been approved by the FDA for management of symptomatic Peyronie's Disease with non-ventral curvature.  This option will likely replace older management options including verapamil or interferon.  Collagenase is given as a series of two injections followed by penile modelling in the office; the patient is then to perform at home modelling to facilitate healing of the penis in a less curved fashion.  The average improvement in curvature for men treated with collagenase is about 17 degrees; risks including pain, bruising, bleeding, failure to improve curvature, and penile fracture.    Surgical management of PD can be accomplished with penile plication, tunica patch grafting, or, in cases where there is concomitant moderate/severe erectile dysfunction, placement of a penile prosthetic device.  Surgical management is generally reserved for patients who have had stable disease for at least 6 months, although the evidence to support  this practice is based primarily on expert opinion.  Plication carries the least risk of complications but may lead to penile foreshortening, whereas patch grafting carries a great risk of subsequent erectile dysfunction.    I emphasized to the patient that making healthy lifestyle choices with respect to diet, exercise, and body weight has been shown to slow progression of sexual problems and in some cases even reverse them.  I recommended that he consider his diet, exercise, and stress management routines and ways he might improve his general health.  This may be of particular benefit with respect to his erectile function.     After discussing the above, the patient wishes to discuss options with his spouse, and he will contact us if he is interested in further intervention.      Once again, thank you for requesting this consultation. Please do not hesitate to contact me with any questions or concerns.    [facility] Interpreter used: no    I spent 30 minutes with the patient, 20 minutes of which were spent counseling the patient on erectile dysfunction and Peyronie's Disease    The patient indicates understanding of these issues and agrees with the plan.    This patient was seen and evaluated with Dr. Reyne DumasGenevieve Sharon Stapel. The care plan was developed with the resident. I agree with the findings and plan as outlined in the resident's note above.    Electronically signed    Latanya MaudlinAlan W. Daina Cara, MD  Associate Professor in Residence  Department of Urology  PI #: [id number]  Pager: [phone]

## 2013-04-29 NOTE — Nursing Note (Signed)
>>   Colton ReilAnn Bertellotti, LVN     Fri Apr 29, 2013 12:16 PM  Vital signs taken, allergies verified, screened for pain, med hx taken. No acute distress noted at this time.   [name] LVN

## 2013-09-01 ENCOUNTER — Encounter (HOSPITAL_COMMUNITY): Payer: Self-pay | Admitting: Pharmacy Technician

## 2013-09-02 NOTE — H&P (Signed)
Sean Green is an 58 y.o. male.    History of Present Illness The patient is a 58 year old male who comes in today for a preoperative history and physical. The patient is scheduled for a discectomy L5-S1 on the left to be performed by Dr. Duane Lope D. Rolena Infante, MD at [facility] on 09-07-13 . Symptoms unchanged from previous visit.    Allergies  No Known Drug Allergies12/05/2010  Medication History  Percocet (5-325MG  Tablet, 1-2 Oral TID PRN, Taken starting 08/22/2013) Active. (RX FOR PICK UP PER DDB/SMT 08/22/13) Cialis (5MG  Tablet, Oral) Active. Citalopram Hydrobromide (40MG  Tablet, Oral) Active. Lisinopril-Hydrochlorothiazide (20-25MG  Tablet, Oral) Active. AmLODIPine Besylate (5MG  Tablet, Oral) Active. Medications Reconciled  Other Problems  Myocardial infarction High blood pressure Anxiety Disorder  Vitals  09/02/2013 9:11 AM Weight: 273 lb Height: 73.5in Body Surface Area: 2.53 m Body Mass Index: 35.53 kg/m Temp.: 98.31F(Oral)  BP: 125/83 (Sitting, Right Arm, Standard)  Past Medical History  Diagnosis Date  . BPH (benign prostatic hyperplasia)   . Hypogonadism male   . Pericarditis   . Leukocytosis, unspecified   . Complication of anesthesia     woke up during hip surgery  . Hypertension     takes medication daily  . Myocardial infarction   . Dizziness   . Neuromuscular disorder     numbess/tingling in left hand intermittently  . Pneumonia     hx of  . Nocturia associated with benign prostatic hypertrophy     Past Surgical History  Procedure Laterality Date  . Cardiac catheterization  2010  . Joint replacement  2008    right hip replacement  . Rotator cuff repair  2002    right  . Anterior cervical decomp/discectomy fusion  06/25/2011    Procedure: ANTERIOR CERVICAL DECOMPRESSION/DISCECTOMY FUSION 3 LEVELS;  Surgeon: Hosie Spangle, MD;  Location: [facility];  Service: Neurosurgery;  Laterality: N/A;  Cervical Four-Five Cervical  Five-Six Cervical Six-Seven Anterior Cervical Decompression with Fusion plating and bonegraft    Family History  Problem Relation Age of Onset  . Anesthesia problems Neg Hx   . Hypotension Neg Hx   . Malignant hyperthermia Neg Hx   . Pseudochol deficiency Neg Hx    Social History:  reports that he has never smoked. He does not have any smokeless tobacco history on file. He reports that he drinks alcohol. He reports that he does not use illicit drugs.  Allergies:  Allergies  Allergen Reactions  . Adhesive [Tape] Rash    No prescriptions prior to admission    No results found for this or any previous visit (from the past 29 hour(s)). No results found.  Review of Systems  Constitutional: Negative.   HENT: Negative.   Eyes: Negative.   Respiratory: Negative.   Cardiovascular: Negative.   Gastrointestinal: Negative.   Genitourinary: Negative.   Musculoskeletal: Positive for back pain.  Skin: Negative.   Neurological: Positive for tingling. Negative for tremors, seizures and loss of consciousness.  Psychiatric/Behavioral: Negative.     There were no vitals taken for this visit. Physical Exam  Constitutional: He is oriented to person, place, and time. He appears well-developed.  HENT:  Head: Normocephalic and atraumatic.  Eyes: EOM are normal. Pupils are equal, round, and reactive to light.  Neck: Normal range of motion.  Cardiovascular: Normal rate and regular rhythm.   Respiratory: Effort normal and breath sounds normal.  GI: Soft. Bowel sounds are normal.  Musculoskeletal: Normal range of motion.  Neurological: He  is alert and oriented to person, place, and time.  Skin: Skin is warm and dry.     Assessment/Plan Left L5-S1 HNP, low back pain and Left LE radiculopathy.  Will proceed with L5-S1 left microdiscectomy as scheduled.  Procedure along with possible risks, complication and recovery time discussed.  All questions answered.    Abhimanyu Cruces M 09/02/2013, 9:48  AM

## 2013-09-05 ENCOUNTER — Encounter (HOSPITAL_COMMUNITY)
Admission: RE | Admit: 2013-09-05 | Discharge: 2013-09-05 | Disposition: A | Discharge status: Home / Self Care | Payer: 59 | Source: Ambulatory Visit | Attending: Orthopedic Surgery | Admitting: Orthopedic Surgery

## 2013-09-05 ENCOUNTER — Encounter (HOSPITAL_COMMUNITY): Payer: Self-pay

## 2013-09-05 ENCOUNTER — Encounter (HOSPITAL_COMMUNITY)
Admission: RE | Admit: 2013-09-05 | Discharge: 2013-09-05 | Disposition: A | Discharge status: Home / Self Care | Payer: 59 | Source: Ambulatory Visit | Attending: Anesthesiology | Admitting: Anesthesiology

## 2013-09-05 DIAGNOSIS — E291 Testicular hypofunction: Secondary | ICD-10-CM | POA: Diagnosis not present

## 2013-09-05 DIAGNOSIS — N138 Other obstructive and reflux uropathy: Secondary | ICD-10-CM | POA: Diagnosis not present

## 2013-09-05 DIAGNOSIS — Z96649 Presence of unspecified artificial hip joint: Secondary | ICD-10-CM | POA: Diagnosis not present

## 2013-09-05 DIAGNOSIS — R351 Nocturia: Secondary | ICD-10-CM | POA: Diagnosis not present

## 2013-09-05 DIAGNOSIS — M5126 Other intervertebral disc displacement, lumbar region: Secondary | ICD-10-CM | POA: Diagnosis not present

## 2013-09-05 DIAGNOSIS — N401 Enlarged prostate with lower urinary tract symptoms: Secondary | ICD-10-CM | POA: Diagnosis not present

## 2013-09-05 DIAGNOSIS — I10 Essential (primary) hypertension: Secondary | ICD-10-CM | POA: Diagnosis not present

## 2013-09-05 DIAGNOSIS — F411 Generalized anxiety disorder: Secondary | ICD-10-CM | POA: Diagnosis not present

## 2013-09-05 DIAGNOSIS — I252 Old myocardial infarction: Secondary | ICD-10-CM | POA: Diagnosis not present

## 2013-09-05 HISTORY — DX: Nausea with vomiting, unspecified: Z98.890

## 2013-09-05 HISTORY — DX: Anxiety disorder, unspecified: F41.9

## 2013-09-05 HISTORY — DX: Other specified postprocedural states: R11.2

## 2013-09-05 LAB — CBC
HEMATOCRIT: 49.9 % (ref 39.0–52.0)
Hemoglobin: 17 g/dL (ref 13.0–17.0)
MCH: 30.9 pg (ref 26.0–34.0)
MCHC: 34.1 g/dL (ref 30.0–36.0)
MCV: 90.7 fL (ref 78.0–100.0)
PLATELETS: 343 10*3/uL (ref 150–400)
RBC: 5.5 MIL/uL (ref 4.22–5.81)
RDW: 13.3 % (ref 11.5–15.5)
WBC: 10.1 10*3/uL (ref 4.0–10.5)

## 2013-09-05 LAB — SURGICAL PCR SCREEN
MRSA, PCR: NEGATIVE
STAPHYLOCOCCUS AUREUS: NEGATIVE

## 2013-09-05 LAB — BASIC METABOLIC PANEL
ANION GAP: 15 (ref 5–15)
BUN: 15 mg/dL (ref 6–23)
CALCIUM: 10.1 mg/dL (ref 8.4–10.5)
CHLORIDE: 103 meq/L (ref 96–112)
CO2: 28 mEq/L (ref 19–32)
Creatinine, Ser: 1.05 mg/dL (ref 0.50–1.35)
GFR calc Af Amer: 89 mL/min — ABNORMAL LOW (ref >90)
GFR calc non Af Amer: 77 mL/min — ABNORMAL LOW (ref >90)
Glucose, Bld: 121 mg/dL — ABNORMAL HIGH (ref 70–99)
Potassium: 4.1 mEq/L (ref 3.7–5.3)
Sodium: 146 mEq/L (ref 137–147)

## 2013-09-05 NOTE — Progress Notes (Signed)
09/05/13 1257  OBSTRUCTIVE SLEEP APNEA  Have you ever been diagnosed with sleep apnea through a sleep study? No  Do you snore loudly (loud enough to be heard through closed doors)?  0  Do you often feel tired, fatigued, or sleepy during the daytime? 1  Has anyone observed you stop breathing during your sleep? 0  Do you have, or are you being treated for high blood pressure? 1  BMI more than 35 kg/m2? 1  Age over 57 years old? 1  Neck circumference greater than 40 cm/16 inches? 0  Gender: 1  Obstructive Sleep Apnea Score 5  Score 4 or greater  Results sent to PCP

## 2013-09-05 NOTE — Pre-Procedure Instructions (Signed)
Sean Green  09/05/2013   Your procedure is scheduled on:  Wednesday, August 19th  Report to [facility] at 69 AM.  Call this number if you have problems the morning of surgery: [phone]   Remember:   Do not eat food or drink liquids after midnight.   Take these medicines the morning of surgery with A SIP OF WATER: norvasc, celexa, valium if needed, percocet if needed  Stop taking aspirin, OTC vitamins/herbal medications, NSAIDS (ibuprofen, advil, motrin) 7 days prior to surgery.   Do not wear jewelry.  Do not wear lotions, powders, or perfumes. You may wear deodorant.  Do not shave 48 hours prior to surgery. Men may shave face and neck.  Do not bring valuables to the hospital.  [facility] is not responsible for any belongings or valuables.               Contacts, dentures or bridgework may not be worn into surgery.  Leave suitcase in the car. After surgery it may be brought to your room.  For patients admitted to the hospital, discharge time is determined by your  treatment team.               Patients discharged the day of surgery will not be allowed to drive home.  Please read over the following fact sheets that you were given: Pain Booklet, Coughing and Deep Breathing, MRSA Information and Surgical Site Infection Prevention Sean Green - Preparing for Surgery  Before surgery, you can play an important role.  Because skin is not sterile, your skin needs to be as free of germs as possible.  You can reduce the number of germs on you skin by washing with CHG (chlorahexidine gluconate) soap before surgery.  CHG is an antiseptic cleaner which kills germs and bonds with the skin to continue killing germs even after washing.  Please DO NOT use if you have an allergy to CHG or antibacterial soaps.  If your skin becomes reddened/irritated stop using the CHG and inform your nurse when you arrive at Short Stay.  Do not shave (including legs and underarms) for at  least 48 hours prior to the first CHG shower.  You may shave your face.  Please follow these instructions carefully:   1.  Shower with CHG Soap the night before surgery and the morning of Surgery.  2.  If you choose to wash your hair, wash your hair first as usual with your normal shampoo.  3.  After you shampoo, rinse your hair and body thoroughly to remove the shampoo.  4.  Use CHG as you would any other liquid soap.  You can apply CHG directly to the skin and wash gently with scrungie or a clean washcloth.  5.  Apply the CHG Soap to your body ONLY FROM THE NECK DOWN.  Do not use on open wounds or open sores.  Avoid contact with your eyes, ears, mouth and genitals (private parts).  Wash genitals (private parts) with your normal soap.  6.  Wash thoroughly, paying special attention to the area where your surgery will be performed.  7.  Thoroughly rinse your body with warm water from the neck down.  8.  DO NOT shower/wash with your normal soap after using and rinsing off the CHG Soap.  9.  Pat yourself dry with a clean towel.            10.  Wear clean pajamas.  11.  Place clean sheets on your bed the night of your first shower and do not sleep with pets.  Day of Surgery  Do not apply any lotions/deoderants the morning of surgery.  Please wear clean clothes to the hospital/surgery center.

## 2013-09-05 NOTE — Progress Notes (Signed)
Primary - dr. Stephanie Acre ([facility]) Has seen dr. Burt Knack in past for percarditis but did not require follow up ekg in chart from 7/15 no recent cardiac testing since 2011 echo and cath

## 2013-09-06 MED ORDER — ACETAMINOPHEN 10 MG/ML IV SOLN
1000.0000 mg | Freq: Once | INTRAVENOUS | Status: AC
  Administered 2013-09-07: 1000 mg via INTRAVENOUS
  Filled 2013-09-06: qty 100

## 2013-09-06 MED ORDER — DEXTROSE 5 % IV SOLN
2.0000 g | INTRAVENOUS | Status: AC
  Administered 2013-09-07: 1 g via INTRAVENOUS
  Administered 2013-09-07: 2 g via INTRAVENOUS
  Filled 2013-09-06: qty 2

## 2013-09-06 MED ORDER — DEXAMETHASONE SODIUM PHOSPHATE 4 MG/ML IJ SOLN
10.0000 mg | Freq: Once | INTRAMUSCULAR | Status: AC
  Administered 2013-09-07: 10 mg via INTRAVENOUS
  Filled 2013-09-06: qty 3

## 2013-09-07 ENCOUNTER — Ambulatory Visit (HOSPITAL_COMMUNITY): Payer: 59 | Admitting: Certified Registered Nurse Anesthetist

## 2013-09-07 ENCOUNTER — Encounter (HOSPITAL_COMMUNITY): Payer: 59 | Admitting: Certified Registered Nurse Anesthetist

## 2013-09-07 ENCOUNTER — Encounter (HOSPITAL_COMMUNITY)
Admission: RE | Disposition: A | Discharge status: Home / Self Care | Payer: Self-pay | Source: Ambulatory Visit | Attending: Orthopedic Surgery

## 2013-09-07 ENCOUNTER — Encounter (HOSPITAL_COMMUNITY): Payer: Self-pay | Admitting: Certified Registered Nurse Anesthetist

## 2013-09-07 ENCOUNTER — Observation Stay (HOSPITAL_COMMUNITY)
Admission: RE | Admit: 2013-09-07 | Discharge: 2013-09-08 | Disposition: A | Discharge status: Home / Self Care | Payer: 59 | Source: Ambulatory Visit | Attending: Orthopedic Surgery | Admitting: Orthopedic Surgery

## 2013-09-07 ENCOUNTER — Ambulatory Visit (HOSPITAL_COMMUNITY): Payer: 59

## 2013-09-07 DIAGNOSIS — I10 Essential (primary) hypertension: Secondary | ICD-10-CM | POA: Insufficient documentation

## 2013-09-07 DIAGNOSIS — N138 Other obstructive and reflux uropathy: Secondary | ICD-10-CM | POA: Insufficient documentation

## 2013-09-07 DIAGNOSIS — E291 Testicular hypofunction: Secondary | ICD-10-CM | POA: Insufficient documentation

## 2013-09-07 DIAGNOSIS — Z9889 Other specified postprocedural states: Secondary | ICD-10-CM

## 2013-09-07 DIAGNOSIS — R351 Nocturia: Secondary | ICD-10-CM | POA: Insufficient documentation

## 2013-09-07 DIAGNOSIS — N401 Enlarged prostate with lower urinary tract symptoms: Secondary | ICD-10-CM | POA: Insufficient documentation

## 2013-09-07 DIAGNOSIS — M5126 Other intervertebral disc displacement, lumbar region: Secondary | ICD-10-CM | POA: Diagnosis not present

## 2013-09-07 DIAGNOSIS — M549 Dorsalgia, unspecified: Secondary | ICD-10-CM | POA: Diagnosis present

## 2013-09-07 DIAGNOSIS — I252 Old myocardial infarction: Secondary | ICD-10-CM | POA: Insufficient documentation

## 2013-09-07 DIAGNOSIS — Z96649 Presence of unspecified artificial hip joint: Secondary | ICD-10-CM | POA: Insufficient documentation

## 2013-09-07 DIAGNOSIS — F411 Generalized anxiety disorder: Secondary | ICD-10-CM | POA: Insufficient documentation

## 2013-09-07 HISTORY — DX: Unspecified osteoarthritis, unspecified site: M19.90

## 2013-09-07 HISTORY — PX: LUMBAR LAMINECTOMY/DECOMPRESSION MICRODISCECTOMY: SHX5026

## 2013-09-07 HISTORY — PX: LUMBAR LAMINECTOMY: SHX95

## 2013-09-07 SURGERY — LUMBAR LAMINECTOMY/DECOMPRESSION MICRODISCECTOMY 1 LEVEL
Anesthesia: General | Site: Spine Lumbar | Laterality: Left

## 2013-09-07 MED ORDER — LACTATED RINGERS IV SOLN
INTRAVENOUS | Status: DC | PRN
  Administered 2013-09-07 (×4): via INTRAVENOUS

## 2013-09-07 MED ORDER — FENTANYL CITRATE 0.05 MG/ML IJ SOLN
INTRAMUSCULAR | Status: DC | PRN
  Administered 2013-09-07: 50 ug via INTRAVENOUS
  Administered 2013-09-07 (×2): 100 ug via INTRAVENOUS
  Administered 2013-09-07 (×5): 50 ug via INTRAVENOUS

## 2013-09-07 MED ORDER — LIDOCAINE HCL (CARDIAC) 20 MG/ML IV SOLN
INTRAVENOUS | Status: AC
  Filled 2013-09-07: qty 5

## 2013-09-07 MED ORDER — LISINOPRIL-HYDROCHLOROTHIAZIDE 20-25 MG PO TABS: 1.0000 | ORAL_TABLET | Freq: Every day | ORAL | Status: DC

## 2013-09-07 MED ORDER — PHENYLEPHRINE HCL 10 MG/ML IJ SOLN
10.0000 mg | INTRAVENOUS | Status: DC | PRN
  Administered 2013-09-07: 20 ug/min via INTRAVENOUS

## 2013-09-07 MED ORDER — ROCURONIUM BROMIDE 50 MG/5ML IV SOLN
INTRAVENOUS | Status: AC
  Filled 2013-09-07: qty 1

## 2013-09-07 MED ORDER — PHENOL 1.4 % MT LIQD: 1.0000 | OROMUCOSAL | Status: DC | PRN

## 2013-09-07 MED ORDER — LACTATED RINGERS IV SOLN
INTRAVENOUS | Status: DC
  Administered 2013-09-07: 12:00:00 via INTRAVENOUS

## 2013-09-07 MED ORDER — NEOSTIGMINE METHYLSULFATE 10 MG/10ML IV SOLN
INTRAVENOUS | Status: AC
  Filled 2013-09-07: qty 1

## 2013-09-07 MED ORDER — HEMOSTATIC AGENTS (NO CHARGE) OPTIME
TOPICAL | Status: DC | PRN
  Administered 2013-09-07: 1 via TOPICAL

## 2013-09-07 MED ORDER — HYDROMORPHONE HCL PF 1 MG/ML IJ SOLN
0.2500 mg | INTRAMUSCULAR | Status: DC | PRN
Start: 2013-09-07 — End: 2013-09-07
  Administered 2013-09-07: 0.25 mg via INTRAVENOUS

## 2013-09-07 MED ORDER — OXYCODONE HCL 5 MG/5ML PO SOLN: 5.0000 mg | Freq: Once | ORAL | Status: DC | PRN

## 2013-09-07 MED ORDER — HYDROMORPHONE HCL PF 1 MG/ML IJ SOLN
INTRAMUSCULAR | Status: AC
  Administered 2013-09-07: 0.25 mg via INTRAVENOUS
  Filled 2013-09-07: qty 1

## 2013-09-07 MED ORDER — OXYCODONE HCL 5 MG PO TABS: 5.0000 mg | ORAL_TABLET | Freq: Once | ORAL | Status: DC | PRN

## 2013-09-07 MED ORDER — BUPIVACAINE-EPINEPHRINE 0.25% -1:200000 IJ SOLN
INTRAMUSCULAR | Status: DC | PRN
  Administered 2013-09-07: 11 mL

## 2013-09-07 MED ORDER — PROPOFOL 10 MG/ML IV BOLUS
INTRAVENOUS | Status: AC
Start: 2013-09-07 — End: 2013-09-07
  Filled 2013-09-07: qty 20

## 2013-09-07 MED ORDER — THROMBIN 20000 UNITS EX SOLR
CUTANEOUS | Status: AC
  Filled 2013-09-07: qty 20000

## 2013-09-07 MED ORDER — MIDAZOLAM HCL 2 MG/2ML IJ SOLN
INTRAMUSCULAR | Status: AC
  Filled 2013-09-07: qty 2

## 2013-09-07 MED ORDER — SUCCINYLCHOLINE CHLORIDE 20 MG/ML IJ SOLN
INTRAMUSCULAR | Status: DC | PRN
  Administered 2013-09-07: 120 mg via INTRAVENOUS

## 2013-09-07 MED ORDER — ONDANSETRON HCL 4 MG/2ML IJ SOLN
INTRAMUSCULAR | Status: DC | PRN
  Administered 2013-09-07: 4 mg via INTRAVENOUS

## 2013-09-07 MED ORDER — DEXAMETHASONE SODIUM PHOSPHATE 4 MG/ML IJ SOLN
4.0000 mg | Freq: Four times a day (QID) | INTRAMUSCULAR | Status: DC
  Filled 2013-09-07 (×7): qty 1

## 2013-09-07 MED ORDER — GLYCOPYRROLATE 0.2 MG/ML IJ SOLN
INTRAMUSCULAR | Status: DC | PRN
  Administered 2013-09-07: 0.6 mg via INTRAVENOUS

## 2013-09-07 MED ORDER — ROCURONIUM BROMIDE 100 MG/10ML IV SOLN
INTRAVENOUS | Status: DC | PRN
  Administered 2013-09-07: 40 mg via INTRAVENOUS
  Administered 2013-09-07 (×2): 10 mg via INTRAVENOUS

## 2013-09-07 MED ORDER — AMLODIPINE BESYLATE 10 MG PO TABS
10.0000 mg | ORAL_TABLET | Freq: Every day | ORAL | Status: DC
  Administered 2013-09-08: 10 mg via ORAL
  Filled 2013-09-07: qty 1

## 2013-09-07 MED ORDER — NEOSTIGMINE METHYLSULFATE 10 MG/10ML IV SOLN
INTRAVENOUS | Status: DC | PRN
  Administered 2013-09-07: 5 mg via INTRAVENOUS

## 2013-09-07 MED ORDER — DEXAMETHASONE SODIUM PHOSPHATE 10 MG/ML IJ SOLN
INTRAMUSCULAR | Status: AC
  Filled 2013-09-07: qty 1

## 2013-09-07 MED ORDER — MIDAZOLAM HCL 5 MG/5ML IJ SOLN
INTRAMUSCULAR | Status: DC | PRN
  Administered 2013-09-07: 2 mg via INTRAVENOUS

## 2013-09-07 MED ORDER — PROPOFOL 10 MG/ML IV BOLUS
INTRAVENOUS | Status: AC
  Filled 2013-09-07: qty 20

## 2013-09-07 MED ORDER — ONDANSETRON HCL 4 MG/2ML IJ SOLN: 4.0000 mg | INTRAMUSCULAR | Status: DC | PRN

## 2013-09-07 MED ORDER — LIDOCAINE HCL 4 % MT SOLN
OROMUCOSAL | Status: DC | PRN
  Administered 2013-09-07: 4 mL via TOPICAL

## 2013-09-07 MED ORDER — SODIUM CHLORIDE 0.9 % IV SOLN: 250.0000 mL | INTRAVENOUS | Status: DC

## 2013-09-07 MED ORDER — PROMETHAZINE HCL 25 MG/ML IJ SOLN: 6.2500 mg | INTRAMUSCULAR | Status: DC | PRN

## 2013-09-07 MED ORDER — BUPIVACAINE-EPINEPHRINE (PF) 0.25% -1:200000 IJ SOLN
INTRAMUSCULAR | Status: AC
  Filled 2013-09-07: qty 30

## 2013-09-07 MED ORDER — SODIUM CHLORIDE 0.9 % IJ SOLN: 3.0000 mL | Freq: Two times a day (BID) | INTRAMUSCULAR | Status: DC

## 2013-09-07 MED ORDER — MORPHINE SULFATE 2 MG/ML IJ SOLN
1.0000 mg | INTRAMUSCULAR | Status: DC | PRN
  Administered 2013-09-07 (×2): 4 mg via INTRAVENOUS
  Administered 2013-09-08: 2 mg via INTRAVENOUS
  Filled 2013-09-07: qty 1
  Filled 2013-09-07 (×2): qty 2

## 2013-09-07 MED ORDER — EPHEDRINE SULFATE 50 MG/ML IJ SOLN
INTRAMUSCULAR | Status: DC | PRN
  Administered 2013-09-07 (×6): 10 mg via INTRAVENOUS

## 2013-09-07 MED ORDER — DEXAMETHASONE 4 MG PO TABS
4.0000 mg | ORAL_TABLET | Freq: Four times a day (QID) | ORAL | Status: DC
  Administered 2013-09-07 – 2013-09-08 (×4): 4 mg via ORAL
  Filled 2013-09-07 (×7): qty 1

## 2013-09-07 MED ORDER — CITALOPRAM HYDROBROMIDE 40 MG PO TABS
40.0000 mg | ORAL_TABLET | Freq: Every day | ORAL | Status: DC
  Administered 2013-09-08: 40 mg via ORAL
  Filled 2013-09-07 (×2): qty 1

## 2013-09-07 MED ORDER — FENTANYL CITRATE 0.05 MG/ML IJ SOLN
INTRAMUSCULAR | Status: AC
  Filled 2013-09-07: qty 5

## 2013-09-07 MED ORDER — METHOCARBAMOL 500 MG PO TABS: 500.0000 mg | ORAL_TABLET | Freq: Four times a day (QID) | ORAL | Status: DC | PRN

## 2013-09-07 MED ORDER — ARTIFICIAL TEARS OP OINT
TOPICAL_OINTMENT | OPHTHALMIC | Status: DC | PRN
Start: 2013-09-07 — End: 2013-09-07
  Administered 2013-09-07: 1 via OPHTHALMIC

## 2013-09-07 MED ORDER — LIDOCAINE HCL (CARDIAC) 20 MG/ML IV SOLN
INTRAVENOUS | Status: AC
  Filled 2013-09-07: qty 10

## 2013-09-07 MED ORDER — HYDROCHLOROTHIAZIDE 25 MG PO TABS
25.0000 mg | ORAL_TABLET | Freq: Every day | ORAL | Status: DC
  Administered 2013-09-08: 25 mg via ORAL
  Filled 2013-09-07: qty 1

## 2013-09-07 MED ORDER — LISINOPRIL 20 MG PO TABS
20.0000 mg | ORAL_TABLET | Freq: Every day | ORAL | Status: DC
  Administered 2013-09-08: 20 mg via ORAL
  Filled 2013-09-07: qty 1

## 2013-09-07 MED ORDER — SODIUM CHLORIDE 0.9 % IJ SOLN: 3.0000 mL | INTRAMUSCULAR | Status: DC | PRN

## 2013-09-07 MED ORDER — MENTHOL 3 MG MT LOZG: 1.0000 | LOZENGE | OROMUCOSAL | Status: DC | PRN

## 2013-09-07 MED ORDER — ONDANSETRON HCL 4 MG/2ML IJ SOLN
INTRAMUSCULAR | Status: AC
  Filled 2013-09-07: qty 2

## 2013-09-07 MED ORDER — OXYCODONE HCL 5 MG PO TABS
10.0000 mg | ORAL_TABLET | ORAL | Status: DC | PRN
  Administered 2013-09-08: 10 mg via ORAL
  Filled 2013-09-07: qty 2

## 2013-09-07 MED ORDER — THROMBIN 20000 UNITS EX SOLR
CUTANEOUS | Status: DC | PRN
  Administered 2013-09-07: 15:00:00 via TOPICAL

## 2013-09-07 MED ORDER — 0.9 % SODIUM CHLORIDE (POUR BTL) OPTIME
TOPICAL | Status: DC | PRN
  Administered 2013-09-07: 1000 mL

## 2013-09-07 MED ORDER — LACTATED RINGERS IV SOLN
INTRAVENOUS | Status: DC
  Administered 2013-09-07: 21:00:00 via INTRAVENOUS

## 2013-09-07 MED ORDER — METHOCARBAMOL 1000 MG/10ML IJ SOLN
500.0000 mg | Freq: Four times a day (QID) | INTRAVENOUS | Status: DC | PRN
  Filled 2013-09-07: qty 5

## 2013-09-07 MED ORDER — DEXTROSE 5 % IV SOLN
INTRAVENOUS | Status: DC | PRN
  Administered 2013-09-07: 14:00:00 via INTRAVENOUS

## 2013-09-07 MED ORDER — CEFAZOLIN SODIUM 1-5 GM-% IV SOLN
1.0000 g | Freq: Three times a day (TID) | INTRAVENOUS | Status: AC
  Administered 2013-09-07 – 2013-09-08 (×2): 1 g via INTRAVENOUS
  Filled 2013-09-07 (×2): qty 50

## 2013-09-07 MED ORDER — GLYCOPYRROLATE 0.2 MG/ML IJ SOLN
INTRAMUSCULAR | Status: AC
  Filled 2013-09-07: qty 3

## 2013-09-07 MED ORDER — CEFAZOLIN SODIUM 1-5 GM-% IV SOLN
INTRAVENOUS | Status: AC
  Filled 2013-09-07: qty 50

## 2013-09-07 MED ORDER — PROPOFOL 10 MG/ML IV BOLUS
INTRAVENOUS | Status: DC | PRN
  Administered 2013-09-07: 250 mg via INTRAVENOUS

## 2013-09-07 MED ORDER — ACETAMINOPHEN 10 MG/ML IV SOLN
1000.0000 mg | Freq: Once | INTRAVENOUS | Status: AC
  Administered 2013-09-07: 1000 mg via INTRAVENOUS
  Filled 2013-09-07: qty 100

## 2013-09-07 MED ORDER — LIDOCAINE HCL (CARDIAC) 20 MG/ML IV SOLN
INTRAVENOUS | Status: DC | PRN
  Administered 2013-09-07: 100 mg via INTRAVENOUS
  Administered 2013-09-07 (×2): 50 mg via INTRAVENOUS

## 2013-09-07 SURGICAL SUPPLY — 60 items
BUR EGG ELITE 4.0 (BURR) IMPLANT
BUR MATCHSTICK NEURO 3.0 LAGG (BURR) IMPLANT
CANISTER SUCTION 2500CC (MISCELLANEOUS) IMPLANT
CLSR STERI-STRIP ANTIMIC 1/2X4 (GAUZE/BANDAGES/DRESSINGS) ×2 IMPLANT
CORDS BIPOLAR (ELECTRODE) ×2 IMPLANT
COVER SURGICAL LIGHT HANDLE (MISCELLANEOUS) ×2 IMPLANT
DRAIN CHANNEL 15F RND FF W/TCR (WOUND CARE) IMPLANT
DRAPE POUCH INSTRU U-SHP 10X18 (DRAPES) ×2 IMPLANT
DRAPE SURG 17X23 STRL (DRAPES) ×2 IMPLANT
DRAPE U-SHAPE 47X51 STRL (DRAPES) ×2 IMPLANT
DRSG MEPILEX BORDER 4X8 (GAUZE/BANDAGES/DRESSINGS) ×2 IMPLANT
DURAPREP 26ML APPLICATOR (WOUND CARE) ×2 IMPLANT
ELECT BLADE 4.0 EZ CLEAN MEGAD (MISCELLANEOUS)
ELECT CAUTERY BLADE 6.4 (BLADE) ×2 IMPLANT
ELECT PENCIL ROCKER SW 15FT (MISCELLANEOUS) ×2 IMPLANT
ELECT REM PT RETURN 9FT ADLT (ELECTROSURGICAL) ×2
ELECTRODE BLDE 4.0 EZ CLN MEGD (MISCELLANEOUS) IMPLANT
ELECTRODE REM PT RTRN 9FT ADLT (ELECTROSURGICAL) ×1 IMPLANT
EVACUATOR SILICONE 100CC (DRAIN) IMPLANT
GLOVE BIOGEL PI IND STRL 8 (GLOVE) ×1 IMPLANT
GLOVE BIOGEL PI IND STRL 8.5 (GLOVE) ×1 IMPLANT
GLOVE BIOGEL PI INDICATOR 8 (GLOVE) ×1
GLOVE BIOGEL PI INDICATOR 8.5 (GLOVE) ×1
GLOVE ECLIPSE 8.5 STRL (GLOVE) ×2 IMPLANT
GLOVE ORTHO TXT STRL SZ7.5 (GLOVE) ×2 IMPLANT
GOWN STRL REUS W/ TWL LRG LVL3 (GOWN DISPOSABLE) ×1 IMPLANT
GOWN STRL REUS W/ TWL XL LVL3 (GOWN DISPOSABLE) ×2 IMPLANT
GOWN STRL REUS W/TWL 2XL LVL3 (GOWN DISPOSABLE) ×4 IMPLANT
GOWN STRL REUS W/TWL LRG LVL3 (GOWN DISPOSABLE) ×1
GOWN STRL REUS W/TWL XL LVL3 (GOWN DISPOSABLE) ×2
KIT BASIN OR (CUSTOM PROCEDURE TRAY) ×2 IMPLANT
KIT ROOM TURNOVER OR (KITS) ×2 IMPLANT
MANIFOLD NEPTUNE WASTE (CANNULA) ×4 IMPLANT
NEEDLE 22X1 1/2 (OR ONLY) (NEEDLE) ×2 IMPLANT
NEEDLE SPNL 18GX3.5 QUINCKE PK (NEEDLE) ×4 IMPLANT
NS IRRIG 1000ML POUR BTL (IV SOLUTION) ×2 IMPLANT
PACK LAMINECTOMY ORTHO (CUSTOM PROCEDURE TRAY) ×2 IMPLANT
PACK UNIVERSAL I (CUSTOM PROCEDURE TRAY) ×2 IMPLANT
PAD ARMBOARD 7.5X6 YLW CONV (MISCELLANEOUS) ×4 IMPLANT
PATTIES SURGICAL .5 X.5 (GAUZE/BANDAGES/DRESSINGS) IMPLANT
PATTIES SURGICAL .5 X1 (DISPOSABLE) ×4 IMPLANT
SPONGE LAP 4X18 X RAY DECT (DISPOSABLE) ×2 IMPLANT
SPONGE SURGIFOAM ABS GEL 100 (HEMOSTASIS) ×2 IMPLANT
SURGIFLO TRUKIT (HEMOSTASIS) ×4 IMPLANT
SUT BONE WAX W31G (SUTURE) ×2 IMPLANT
SUT MON AB 3-0 SH 27 (SUTURE) ×1
SUT MON AB 3-0 SH27 (SUTURE) ×1 IMPLANT
SUT VIC AB 0 CT1 27 (SUTURE) ×1
SUT VIC AB 0 CT1 27XBRD ANBCTR (SUTURE) ×1 IMPLANT
SUT VIC AB 1 CT1 18XCR BRD 8 (SUTURE) ×1 IMPLANT
SUT VIC AB 1 CT1 8-18 (SUTURE) ×1
SUT VIC AB 1 CTX 36 (SUTURE) ×2
SUT VIC AB 1 CTX36XBRD ANBCTR (SUTURE) ×2 IMPLANT
SUT VIC AB 2-0 CT1 18 (SUTURE) ×2 IMPLANT
SYR BULB IRRIGATION 50ML (SYRINGE) ×2 IMPLANT
SYR CONTROL 10ML LL (SYRINGE) ×4 IMPLANT
TOWEL OR 17X24 6PK STRL BLUE (TOWEL DISPOSABLE) ×2 IMPLANT
TOWEL OR 17X26 10 PK STRL BLUE (TOWEL DISPOSABLE) ×2 IMPLANT
WATER STERILE IRR 1000ML POUR (IV SOLUTION) IMPLANT
YANKAUER SUCT BULB TIP NO VENT (SUCTIONS) ×2 IMPLANT

## 2013-09-07 NOTE — Op Note (Signed)
NAME:  Sean Green, Sean Green NO.:  [id number]  MEDICAL RECORD NO.:  [id number]  LOCATION:  [id number]                        FACILITY:  [facility]  PHYSICIAN:  Dahlia Bailiff, MD    DATE OF BIRTH:  25-Mar-1955  DATE OF PROCEDURE:  09/07/2013 DATE OF DISCHARGE:                              OPERATIVE REPORT   PREOPERATIVE DIAGNOSIS:  Posterolateral disk herniation at L5-S1, more superolateral, left-sided migration behind the body of L5.  POSTOPERATIVE DIAGNOSIS:  Posterolateral disk herniation at L5-S1, more superolateral, left-sided migration behind the body of L5.  OPERATIVE PROCEDURES:  L5 laminectomy, partial L4 laminotomy for complete diskectomy for removal of disk fragments, L4-5 and L5-S1.  COMPLICATIONS:  None.  INTRAOPERATIVE FINDINGS:  Significantly large epidural veins with significant bleeding.  Hemostasis was obtained using bipolar electrocautery, but required a partial L4 laminotomy in order to visualize the bleeder and coagulate it.  Large disk fragment just lateral to the L5 pedicle, necessitating the complete laminectomy of L5 in order to fully visualize the fragment and ensure that it was excised. At the conclusion of the case, the L5 and L4 nerve roots were free of tension.  HISTORY:  This is a very pleasant 58 year old gentleman who is having severe debilitating left leg pain with EHL, tibialis anterior weakness, and L5 dysesthesias.  Attempts at conservative management had failed. His MRI demonstrated a posterolateral L5-S1 disk herniation that had migrated superiorly behind the body of L5 just lateral to the left pedicle.  After discussing treatment options, we elected to proceed with surgery.  All appropriate risks, benefits, and alternatives were discussed with the patient, and consent was obtained.  OPERATIVE NOTE:  The patient was brought to the operating room, placed supine on the operating table.  After successful induction of general anesthesia  and endotracheal intubation, TEDs, SCDs were applied.  He was turned prone onto the [place] frame.  All bony prominences were well padded, and the back was prepped and draped in a standard fashion.  Time- out was taken, confirming the patient, procedure, and all other pertinent important data.  Once this was done, I made an incision in the midline.  Sharp dissection was carried down to the deep fascia.  Deep fascia was sharply exposed, incised, and I exposed the left L4-L5 lamina.  I took an x-ray and confirmed that I was at the L5-S1 level, confirmed the L4-5 lamina. Once I confirmed this, I then went down to the L5 lamina and performed a complete laminectomy of L5.  This was accomplished using 2 and 3 mm Kerrison punches.  I then removed the ligamentum flavum and exposed the underlying thecal sac.  This allowed me to see the entire medial aspect of the L5 pedicle, consistent with the location of the disk fragment.  I did not feel as though I would be able to adequately visualize it by doing laminotomies of either L5 or L4.  Once I had the laminectomy complete, I mobilized the thecal sac and found 2 large fragments of disk material, consistent with what was seen on the MRI.  Once they were removed, there was significant epidural bleeding.  Once I had obtained control using  neuro patties, I then exposed inferiorly.  I identified the nerve root, protected it, and then identified the epidural veins and coagulated them with bipolar electrocautery.  I then palpated along the disk space at L5-S1 and confirmed that there was no further disk displacement, and I had adequately removed all of the fragments. Traversing nerve root was free of tension.  Once this was done, I then proceeded superiorly and identified the exiting nerve root above the L5 pedicle and identified the bleeder.  Again, these were large epidural veins and I was able to coagulate with bipolar electrocautery.  There continued to  be persistent bleeding superiorly, so I extended my L4 laminotomy slightly superiorly in order to get above the nerve root and identified the bleeder.  Once it was found, I coagulated with bipolar and I had excellent hemostasis.  At this point, I swept circumferentially at the level of the L5 pedicle to confirm I had removed all of the disk fragments.  The thecal sac had re-expanded to expand into the entire space that was now created after removing the disk fragment.  At this point, I was pleased with the decompression.  I did find the disk herniation at the level of the L5 pedicle, just as what was visualized on the preoperative MRI.  I took another x-ray to confirm I was at the appropriate level and then I irrigated copiously with normal saline.  A thrombin-soaked Gelfoam patty was placed over the thecal sac, and I closed the deep fascia with interrupted #1 Vicryl sutures, then a running layer of 0 Vicryl suture, and then a 2-0 Vicryl suture interrupted, Steri-Strips.  3-0 Monocryl was then used to close the skin.  Steri-Strips and dry dressing were applied, and the patient was extubated and transferred to the PACU without incident.  At the end of the case, all needle and sponge counts were correct.  My first assistant was Benjiman Core, my PA.  He was instrumental in assisting me with retraction, visualization, wound closure.     Dahlia Bailiff, MD     DDB/MEDQ  D:  09/07/2013  T:  09/07/2013  Job:  [id number]

## 2013-09-07 NOTE — Transfer of Care (Signed)
Immediate Anesthesia Transfer of Care Note  Patient: Sean Green  Procedure(s) Performed: Procedure(s): LAMINECTOMY L5 - S1 ON THE LEFT 1 LEVEL (Left)  Patient Location: PACU  Anesthesia Type:General  Level of Consciousness: alert , oriented, patient cooperative and responds to stimulation  Airway & Oxygen Therapy: Patient Spontanous Breathing and Patient connected to nasal cannula oxygen  Post-op Assessment: Report given to PACU RN, Post -op Vital signs reviewed and stable, Patient moving all extremities and Patient moving all extremities X 4  Post vital signs: Reviewed and stable  Complications: No apparent anesthesia complications

## 2013-09-07 NOTE — Progress Notes (Signed)
Dr.Massaggee at bedside, no new orders rec'd, ok for pt to go to floor if no further issues

## 2013-09-07 NOTE — Brief Op Note (Signed)
09/07/2013  4:38 PM  PATIENT:  Sean Green  58 y.o. male  PRE-OPERATIVE DIAGNOSIS:  L5 - S1 Acute Left HNP  POST-OPERATIVE DIAGNOSIS:  L5 - S1 Acute Left HNP  PROCEDURE:  Procedure(s): LAMINECTOMY L5 - S1 ON THE LEFT 1 LEVEL (Left)  SURGEON:  Surgeon(s) and Role:    * Melina Schools, MD - Primary  PHYSICIAN ASSISTANT:   ASSISTANTS: Benjiman Core   ANESTHESIA:   general  EBL:  Total I/O In: 1600 [I.V.:1600] Out: 100 [Blood:100]  BLOOD ADMINISTERED:none  DRAINS: none   LOCAL MEDICATIONS USED:  MARCAINE     SPECIMEN:  No Specimen  DISPOSITION OF SPECIMEN:  N/A  COUNTS:  YES  TOURNIQUET:  * No tourniquets in log *  DICTATION: .Other Dictation: Dictation Number [id number]  PLAN OF CARE: Admit for overnight observation  PATIENT DISPOSITION:  PACU - hemodynamically stable.

## 2013-09-07 NOTE — H&P (Signed)
Clinically EHL and tibialis anterior are 2/5, and gastrocnemius is 5/5 on the left. No other motor deficits on the left side. The right side is 5/5. Positive straight leg raise test on the right side. Mild to moderate back pain with range of motion testing. No obvious skin lesions, abrasion, or contusion. Compartments are soft and nontender. No isolated hip, knee, or ankle pain. Compartments are soft and nontender.  RADIOGRAPHS: MRI from 08/05/13 shows a very large L5-S1 left sided disc herniation with cranial extrusion causing compression of both the exiting L5 and traversing S1 nerve root.  At this point in time clinically the L5 nerve root seems to be taking the blunt of the neurocompression as he has more EHL, tibialis anterior weakness than he does gastrocnemius. His clinical signs and symptoms are consistent with radiculopathy with motor deficits. The patient states that this has been going on now for about four weeks.  At this point we have talked about intervention. I do not think and injection is going to be helpful given the size of this disc herniation. Surgical intervention would be an L5 laminotomy or possibly even laminectomy to excise the entire fragment given its cranial extension and then removal of the disc fragment. Given his previous history of MI, I think this is best done in a hospital setting as opposed to an outpatient surgical setting. We will, of course, get PCP clearance. The risks of that include infection, bleeding, nerve damage, death, stoke, paralysis, failure to heal, need for further surgery, ongoing or worse pain, loss of bowel and bladder control, blood clots, adjacent segment disease.

## 2013-09-07 NOTE — Anesthesia Preprocedure Evaluation (Addendum)
Anesthesia Evaluation  Patient identified by MRN, date of birth, ID band Patient awake    Reviewed: Allergy & Precautions, H&P , NPO status , Patient's Chart, lab work & pertinent test results  History of Anesthesia Complications (+) PONV and history of anesthetic complications  Airway Mallampati: II  Neck ROM: Full    Dental  (+) Dental Advisory Given, Teeth Intact   Pulmonary  breath sounds clear to auscultation        Cardiovascular hypertension, Pt. on medications + CAD and + Past MI Rhythm:Regular Rate:Normal     Neuro/Psych PSYCHIATRIC DISORDERS Anxiety    GI/Hepatic GERD-  Medicated,  Endo/Other  Morbid obesity  Renal/GU      Musculoskeletal   Abdominal (+) + obese,   Peds  Hematology   Anesthesia Other Findings   Reproductive/Obstetrics                          Anesthesia Physical Anesthesia Plan  ASA: III  Anesthesia Plan: General   Post-op Pain Management:    Induction: Intravenous  Airway Management Planned: Oral ETT  Additional Equipment:   Intra-op Plan:   Post-operative Plan: Extubation in OR  Informed Consent: I have reviewed the patients History and Physical, chart, labs and discussed the procedure including the risks, benefits and alternatives for the proposed anesthesia with the patient or authorized representative who has indicated his/her understanding and acceptance.   Dental advisory given  Plan Discussed with: CRNA, Anesthesiologist and Surgeon  Anesthesia Plan Comments:         Anesthesia Quick Evaluation

## 2013-09-07 NOTE — Anesthesia Procedure Notes (Addendum)
Procedure Name: Intubation Date/Time: 09/07/2013 1:54 PM Performed by: Octavio Graves Pre-anesthesia Checklist: Patient identified, Timeout performed, Emergency Drugs available, Suction available and Patient being monitored Patient Re-evaluated:Patient Re-evaluated prior to inductionOxygen Delivery Method: Circle system utilized Preoxygenation: Pre-oxygenation with 100% oxygen Intubation Type: IV induction Ventilation: Mask ventilation without difficulty Laryngoscope Size: Miller and 2 Grade View: Grade I Tube type: Oral Tube size: 7.5 mm Number of attempts: 1 Airway Equipment and Method: Stylet Placement Confirmation: ETT inserted through vocal cords under direct vision,  positive ETCO2 and breath sounds checked- equal and bilateral Secured at: 23 cm Tube secured with: Tape Dental Injury: Teeth and Oropharynx as per pre-operative assessment  Comments: IV induction [place]e - intubation AM CRNA- atraumatic- teeth and mouth as preop, Bilat BS Mass[place]e- + ETCO2

## 2013-09-08 ENCOUNTER — Encounter (HOSPITAL_COMMUNITY): Payer: Self-pay | Admitting: General Practice

## 2013-09-08 DIAGNOSIS — M5126 Other intervertebral disc displacement, lumbar region: Secondary | ICD-10-CM | POA: Diagnosis not present

## 2013-09-08 MED ORDER — OXYCODONE-ACETAMINOPHEN 10-325 MG PO TABS: 1.0000 | ORAL_TABLET | Freq: Four times a day (QID) | ORAL | Status: DC | PRN

## 2013-09-08 MED ORDER — POLYETHYLENE GLYCOL 3350 17 G PO PACK: 17.0000 g | PACK | Freq: Every day | ORAL | Status: DC

## 2013-09-08 MED ORDER — ONDANSETRON 4 MG PO TBDP: 4.0000 mg | ORAL_TABLET | Freq: Three times a day (TID) | ORAL | Status: DC | PRN

## 2013-09-08 MED ORDER — DOCUSATE SODIUM 100 MG PO CAPS: 100.0000 mg | ORAL_CAPSULE | Freq: Two times a day (BID) | ORAL | Status: DC

## 2013-09-08 NOTE — Discharge Summary (Signed)
Patient ID: Sean Green MRN: [id number] DOB/AGE: 05-17-1955 58 y.o.  Admit date: 09/07/2013 Discharge date: 09/08/2013  Admission Diagnoses:  Active Problems:   Back pain   Discharge Diagnoses:  Active Problems:   Back pain  status post Procedure(s): LAMINECTOMY L5 - S1 ON THE LEFT 1 LEVEL  Past Medical History  Diagnosis Date  . BPH (benign prostatic hyperplasia)   . Hypogonadism male   . Pericarditis 2011  . Leukocytosis, unspecified   . Complication of anesthesia     woke up during hip surgery  . Hypertension     takes medication daily  . Dizziness   . Neuromuscular disorder     numbess/tingling in left hand intermittently  . Nocturia associated with benign prostatic hypertrophy   . PONV (postoperative nausea and vomiting)   . Anxiety   . Pneumonia X 1  . Myocardial infarction 2011    vs pericarditis; "in hospital for 5 days"  . Arthritis     "joints" (09/08/2013)    Surgeries: Procedure(s): LAMINECTOMY L5 - S1 ON THE LEFT 1 LEVEL on 09/07/2013   Consultants:    Discharged Condition: Improved  Hospital Course: Sean Green is an 58 y.o. male who was admitted 09/07/2013 for operative treatment of lumbar stenosis. Patient failed conservative treatments (please see the history and physical for the specifics) and had severe unremitting pain that affects sleep, daily activities and work/hobbies. After pre-op clearance, the patient was taken to the operating room on 09/07/2013 and underwent  Procedure(s): LAMINECTOMY L5 - S1 ON THE LEFT 1 LEVEL.    Patient was given perioperative antibiotics: Anti-infectives   Start     Dose/Rate Route Frequency Ordered Stop   09/07/13 2000  ceFAZolin (ANCEF) IVPB 1 g/50 mL premix     1 g 100 mL/hr over 30 Minutes Intravenous Every 8 hours 09/07/13 1846 09/08/13 0342   09/06/13 1357  cefTRIAXone (ROCEPHIN) 2 g in dextrose 5 % 50 mL IVPB     2 g 100 mL/hr over 30 Minutes Intravenous 30 min pre-op 09/06/13 1357 09/07/13 1455       Patient was given sequential compression devices and early ambulation to prevent DVT.   Patient benefited maximally from hospital stay and there were no complications. At the time of discharge, the patient was urinating/moving their bowels without difficulty, tolerating a regular diet, pain is controlled with oral pain medications and they have been cleared by PT/OT.   Recent vital signs: Patient Vitals for the past 24 hrs:  BP Temp Temp src Pulse Resp SpO2  09/08/13 1018 110/52 mmHg - - - - -  09/08/13 0500 110/59 mmHg 98.4 F (36.9 C) Oral 104 20 95 %  09/08/13 0002 96/62 mmHg 98 F (36.7 C) Oral 107 20 97 %  09/07/13 2300 102/52 mmHg 98 F (36.7 C) Oral 88 20 96 %  09/07/13 2100 100/64 mmHg 97.9 F (36.6 C) Oral 90 18 92 %  09/07/13 1902 105/53 mmHg 97.8 F (36.6 C) Oral 77 16 97 %  09/07/13 1815 103/49 mmHg 98.1 F (36.7 C) - 82 14 93 %  09/07/13 1800 93/48 mmHg - - 86 15 93 %  09/07/13 1745 89/45 mmHg - - 85 17 92 %  09/07/13 1730 91/49 mmHg - - 90 17 95 %  09/07/13 1715 101/59 mmHg 97.9 F (36.6 C) - 89 16 94 %     Recent laboratory studies: No results found for this basename: WBC, HGB, HCT, PLT, NA, K,  CL, CO2, BUN, CREATININE, GLUCOSE, PT, INR, CALCIUM, 2,  in the last 72 hours   Discharge Medications:     Medication List    STOP taking these medications       ALEVE 220 MG tablet  Generic drug:  naproxen sodium     aspirin 81 MG tablet     Cranberry 500 MG Caps     multivitamin with minerals Tabs tablet     oxyCODONE-acetaminophen 5-325 MG per tablet  Commonly known as:  PERCOCET/ROXICET  Replaced by:  oxyCODONE-acetaminophen 10-325 MG per tablet      TAKE these medications       amLODipine 10 MG tablet  Commonly known as:  NORVASC  Take 10 mg by mouth daily.     citalopram 40 MG tablet  Commonly known as:  CELEXA  Take 40 mg by mouth daily.     diazepam 5 MG tablet  Commonly known as:  VALIUM  Take 5 mg by mouth every 6 (six) hours as  needed for muscle spasms.     docusate sodium 100 MG capsule  Commonly known as:  COLACE  Take 1 capsule (100 mg total) by mouth 2 (two) times daily.     lisinopril-hydrochlorothiazide 20-25 MG per tablet  Commonly known as:  PRINZIDE,ZESTORETIC  Take 1 tablet by mouth daily.     MELATONIN PO  Take 1 tablet by mouth at bedtime as needed (for sleep).     ondansetron 4 MG disintegrating tablet  Commonly known as:  ZOFRAN ODT  Take 1 tablet (4 mg total) by mouth every 8 (eight) hours as needed.     oxyCODONE-acetaminophen 10-325 MG per tablet  Commonly known as:  PERCOCET  Take 1 tablet by mouth every 6 (six) hours as needed.     PATADAY 0.2 % Soln  Generic drug:  Olopatadine HCl  Place 1 drop into both eyes daily as needed (for allergies).     polyethylene glycol packet  Commonly known as:  MIRALAX / GLYCOLAX  Take 17 g by mouth daily.     vitamin B-12 1000 MCG tablet  Commonly known as:  CYANOCOBALAMIN  Take 1,000 mcg by mouth daily.        Diagnostic Studies: Dg Chest 2 View  09/05/2013   CLINICAL DATA:  Preop for lumbar discectomy.  Hypertension.  EXAM: CHEST  2 VIEW  COMPARISON:  06/18/2011.  FINDINGS: Cardiac silhouette is normal in size. Normal mediastinal and hilar contours.  Elevated right hemidiaphragm. Lungs are clear. No pleural effusion or pneumothorax.  Bony thorax is intact. There has been a previous low anterior cervical spine fusion.  IMPRESSION: No acute cardiopulmonary disease.   Electronically Signed   By: Lajean Manes M.D.   On: 09/05/2013 14:18   Dg Lumbar Spine Complete  09/07/2013   CLINICAL DATA:  L5-S1 decompression.  EXAM: LUMBAR SPINE - COMPLETE 4+ VIEW  COMPARISON:  MRI scan of September 29, 2006.  FINDINGS: Four intraoperative lateral projections of the lower lumbar spine were submitted for review. The first image demonstrates 2 probes directed toward the posterior elements of L3 and L4. The second image demonstrates a surgical probe directed toward  the posterior portion of L4-5 disc space. The third and fourth images surgical probe directed toward the posterior portion of the L5-S1 disc space. Fourth image also demonstrate surgical probe directed toward the posterior margin of the L5 vertebral body.  IMPRESSION: Surgical localization of lower lumbar spine as described above.   Electronically Signed  By: Sabino Dick M.D.   On: 09/07/2013 15:40        Discharge Instructions   Call MD / Call 911    Complete by:  As directed   If you experience chest pain or shortness of breath, CALL 911 and be transported to the hospital emergency room.  If you develope a fever above 101 F, pus (white drainage) or increased drainage or redness at the wound, or calf pain, call your surgeon's office.     Constipation Prevention    Complete by:  As directed   Drink plenty of fluids.  Prune juice may be helpful.  You may use a stool softener, such as Colace (over the counter) 100 mg twice a day.  Use MiraLax (over the counter) for constipation as needed.     Diet - low sodium heart healthy    Complete by:  As directed      Discharge instructions    Complete by:  As directed   Ok to shower 5 days postop.  Do not apply any creams or ointments to incision.  Do not remove steri-strips.  Can use 4x4 gauze and tape for dressing changes.  No aggressive activity.  No bending, squatting or prolonged sitting.  Mostly be in reclined position or lying down.  Wear brace when up and ambulating.     Driving restrictions    Complete by:  As directed   No driving until further notice.     Increase activity slowly as tolerated    Complete by:  As directed      Lifting restrictions    Complete by:  As directed   No lifting until further notice.           Follow-up Information   Schedule an appointment as soon as possible for a visit with Dahlia Bailiff, MD. (needs return office visit 2 weeks postop)    Specialty:  Orthopedic Surgery   Contact information:   [address] [place] [address] [place] [address] [phone]       Discharge Plan:  discharge to home  Disposition:     Signed: Lanae Crumbly for Dr. Melina Schools [facility] [phone] 09/08/2013, 3:25 PM

## 2013-09-08 NOTE — Evaluation (Signed)
Physical Therapy Evaluation Patient Details Name: Sean Green MRN: [id number] DOB: Mar 24, 1955 Today's Date: 09/08/2013   History of Present Illness  pt presents with L4-5, L5-S1 Diskectomy.    Clinical Impression  Pt movign great.  Feel pt ready for D/C from PT stand point.      Follow Up Recommendations No PT follow up;Supervision - Intermittent    Equipment Recommendations  None recommended by PT    Recommendations for Other Services       Precautions / Restrictions Precautions Precautions: Back Precaution Booklet Issued: Yes (comment) Precaution Comments: pt has neoprene brace that is too small for him.  PA aware and to follow up with MD as to whether pt needs brace or if he can go without.   Restrictions Weight Bearing Restrictions: No      Mobility  Bed Mobility Overal bed mobility: Modified Independent             General bed mobility comments: pt demos good log roll and indicates he has been doing this at home for some time.    Transfers Overall transfer level: Modified independent Equipment used: None                Ambulation/Gait Ambulation/Gait assistance: Modified independent (Device/Increase time) Ambulation Distance (Feet): 250 Feet Assistive device: None Gait Pattern/deviations: Step-through pattern;Decreased stride length   Gait velocity interpretation: Below normal speed for age/gender General Gait Details: pt moves slowly, but well.  cued for back precuations during mobility.    Stairs Stairs: Yes Stairs assistance: Modified independent (Device/Increase time) Stair Management: One rail Left;Step to pattern;Forwards Number of Stairs: 5 General stair comments: pt demos good technique and recalls sequencing from old THA.    Wheelchair Mobility    Modified Rankin (Stroke Patients Only)       Balance Overall balance assessment: Modified Independent                                           Pertinent  Vitals/Pain Pain Assessment: 0-10 Pain Score: 3  Pain Location: back Pain Descriptors / Indicators: Cramping Pain Intervention(s): Premedicated before session;Repositioned    Home Living Family/patient expects to be discharged to:: Private residence Living Arrangements: Alone Available Help at Discharge: Family;Available 24 hours/day (Sister to stay for a few days.  ) Type of Home: House Home Access: Stairs to enter Entrance Stairs-Rails: Psychiatric nurse of Steps: 3 Home Layout: One level Home Equipment: Toilet riser      Prior Function Level of Independence: Independent               Hand Dominance        Extremity/Trunk Assessment   Upper Extremity Assessment: Overall WFL for tasks assessed           Lower Extremity Assessment: Overall WFL for tasks assessed      Cervical / Trunk Assessment: Normal  Communication   Communication: No difficulties  Cognition Arousal/Alertness: Awake/alert Behavior During Therapy: WFL for tasks assessed/performed Overall Cognitive Status: Within Functional Limits for tasks assessed                      General Comments      Exercises        Assessment/Plan    PT Assessment Patent does not need any further PT services  PT Diagnosis     PT Problem List  PT Treatment Interventions     PT Goals (Current goals can be found in the Care Plan section) Acute Rehab PT Goals PT Goal Formulation: No goals set, d/c therapy    Frequency     Barriers to discharge        Co-evaluation               End of Session   Activity Tolerance: Patient tolerated treatment well Patient left: in chair;with call bell/phone within reach Nurse Communication: Mobility status    Functional Assessment Tool Used: Clinical Judgement Functional Limitation: Mobility: Walking and moving around Mobility: Walking and Moving Around Current Status (B0211): 0 percent impaired, limited or  restricted Mobility: Walking and Moving Around Goal Status [phone]): 0 percent impaired, limited or restricted Mobility: Walking and Moving Around Discharge Status [phone]): 0 percent impaired, limited or restricted    Time: 0825-0854 PT Time Calculation (min): 29 min   Charges:   PT Evaluation $Initial PT Evaluation Tier I: 1 Procedure PT Treatments $Gait Training: 8-22 mins   PT G Codes:   Functional Assessment Tool Used: Clinical Judgement Functional Limitation: Mobility: Walking and moving around    ElginThornton Papas, [place] [phone] 09/08/2013, 10:09 AM

## 2013-09-08 NOTE — Progress Notes (Signed)
UR completed 

## 2013-09-08 NOTE — Progress Notes (Signed)
Subjective: Doing well.  preop leg pain gone. No voiding issues.     Objective: Vital signs in last 24 hours: Temp:  [97.8 F (36.6 C)-98.4 F (36.9 C)] 98.4 F (36.9 C) (08/20 0500) Pulse Rate:  [77-107] 104 (08/20 0500) Resp:  [14-20] 20 (08/20 0500) BP: (89-133)/(45-79) 110/59 mmHg (08/20 0500) SpO2:  [92 %-97 %] 95 % (08/20 0500) FiO2 (%):  [2 %] 2 % (08/19 1902) Weight:  [127.461 kg (281 lb)] 127.461 kg (281 lb) (08/19 1139)  Intake/Output from previous day: 08/19 0701 - 08/20 0700 In: 2840 [P.O.:240; I.V.:2600] Out: 150 [Blood:150] Intake/Output this shift:     Recent Labs  09/05/13 1309  HGB 17.0    Recent Labs  09/05/13 1309  WBC 10.1  RBC 5.50  HCT 49.9  PLT 343    Recent Labs  09/05/13 1309  NA 146  K 4.1  CL 103  CO2 28  BUN 15  CREATININE 1.05  GLUCOSE 121*  CALCIUM 10.1   No results found for this basename: LABPT, INR,  in the last 72 hours  Exam:  Dressing c/d/i. bilat calves nontender. NVI.   Assessment/Plan: Anticipate d/c home today.  Making good progress with PT   Eloyce Bultman M 09/08/2013, 8:44 AM

## 2013-09-08 NOTE — Evaluation (Signed)
Occupational Therapy Evaluation and Discharge Patient Details Name: Sean Green MRN: [id number] DOB: Jul 14, 1955 Today's Date: 09/08/2013    History of Present Illness pt presents with L4-5, L5-S1 Diskectomy.     Clinical Impression   This 58 yo male admitted with above presents to acute OT with all education completed, we will D/C from acute OT.    Follow Up Recommendations  No OT follow up    Equipment Recommendations  None recommended by OT       Precautions / Restrictions Precautions Precautions: Back Precaution Booklet Issued: Yes (comment) Precaution Comments: pt has neoprene brace that is too small for him.  PA aware and to follow up with MD as to whether pt needs brace or if he can go without.   Restrictions Weight Bearing Restrictions: No      Mobility Bed Mobility General bed mobility comments: Pt up in recliner upon my arrival  Transfers Overall transfer level: Modified independent Equipment used: None                  Balance Overall balance assessment: Modified Independent                                          ADL                                         General ADL Comments: Pt reports that he has all AE from a hip surgery and knows how to use it. I explained to him how he should use 2 cups to brush his teeth (one to spit in and one as a rinse cup). I had him practice a toliet transfer to the shower seat (this is the height of a regular height toilet) with legs slightly apart and feet externally rotated. He will use his 3n1 in the showe stall as a seat. I advised him to use wet wipes for ease of cleaning after a bowel movement and also as a way to decrease his want of twisting.               Pertinent Vitals/Pain Pain Assessment: No/denies pain Pain Score: 0-No pain      Hand Dominance Right   Extremity/Trunk Assessment Upper Extremity Assessment Upper Extremity Assessment: Overall WFL for tasks  assessed     Communication Communication Communication: No difficulties   Cognition Arousal/Alertness: Awake/alert Behavior During Therapy: WFL for tasks assessed/performed Overall Cognitive Status: Within Functional Limits for tasks assessed                                Home Living Family/patient expects to be discharged to:: Private residence Living Arrangements: Alone Available Help at Discharge: Family;Available 24 hours/day;Friend(s) (sister to stay for a few days) Type of Home: House Home Access: Stairs to enter [employer] of Steps: 3 Entrance Stairs-Rails: Right;Left Home Layout: One level     Bathroom Shower/Tub: Walk-in Hydrologist: Standard     Home Equipment: Environmental consultant - 2 wheels;Shower seat - built in;Bedside commode;Adaptive equipment Adaptive Equipment: Reacher;Sock aid;Long-handled shoe horn;Long-handled sponge (from a hip surgery)        Prior Functioning/Environment Level of Independence: Independent  OT Goals(Current goals can be found in the care plan section) Acute Rehab OT Goals Patient Stated Goal: Home today                End of Session Equipment Utilized During Treatment:  (None)  Activity Tolerance: Patient tolerated treatment well Patient left:  (standing up at his recliner)   Time: 9528-4132 OT Time Calculation (min): 9 min Charges:  OT General Charges $OT Visit: 1 Procedure OT Evaluation $Initial OT Evaluation Tier I: 1 Procedure G-Codes: OT G-codes **NOT FOR INPATIENT CLASS** Functional Assessment Tool Used: Clinical observation Functional Limitation: Self care Self Care Current Status (G4010): At least 1 percent but less than 20 percent impaired, limited or restricted Self Care Goal Status (U7253): At least 1 percent but less than 20 percent impaired, limited or restricted Self Care Discharge Status [phone]): At least 1 percent but less than 20 percent  impaired, limited or restricted  Sean Green [phone] 09/08/2013, 10:49 AM

## 2013-09-08 NOTE — Care Management (Signed)
CARE MANAGEMENT NOTE 09/08/2013  Patient:  JGUADALUPE, OPIELA   Account Number:  [id number]  Date Initiated:  09/08/2013  Documentation initiated by:  Ricki Miller  Subjective/Objective Assessment:   58 yr old male admitted with L5-S1 Acute Left HNP, s/p L5 Laminectomy, L4 partial diskectomy L4-5, L5-S1.     Action/Plan:   No home health or DME needs identified. Patient has family support at discharge.   Anticipated DC Date:  09/08/2013   Anticipated DC Plan:  [place]  CM consult      PAC Choice  NA   Choice offered to / List presented to:  NA   DME arranged  NA        HH arranged  NA      Status of service:  Completed, signed off Medicare Important Message given?   (If response is "NO", the following Medicare IM given date fields will be blank) Date Medicare IM given:   Medicare IM given by:   Date Additional Medicare IM given:   Additional Medicare IM given by:    Discharge Disposition:  HOME/SELF CARE  Per UR Regulation:  Reviewed for med. necessity/level of care/duration of stay

## 2013-09-08 NOTE — Anesthesia Postprocedure Evaluation (Signed)
  Anesthesia Post-op Note  Patient: Sean Green  Procedure(s) Performed: Procedure(s): LAMINECTOMY L5 - S1 ON THE LEFT 1 LEVEL (Left)  Patient Location: PACU  Anesthesia Type:General  Level of Consciousness: awake, alert  and oriented  Airway and Oxygen Therapy: Patient Spontanous Breathing and Patient connected to nasal cannula oxygen  Post-op Pain: mild  Post-op Assessment: Post-op Vital signs reviewed, Patient's Cardiovascular Status Stable, Respiratory Function Stable, Patent Airway and Pain level controlled  Post-op Vital Signs: stable  Last Vitals:  Filed Vitals:   09/08/13 0002  BP: 96/62  Pulse: 107  Temp: 36.7 C  Resp: 20    Complications: No apparent anesthesia complications

## 2013-09-09 NOTE — Addendum Note (Signed)
Addendum created 09/09/13 1321 by Rica Koyanagi, MD   Modules edited: Anesthesia Attestations

## 2013-09-14 NOTE — Discharge Summary (Signed)
Agree with above 

## 2014-06-02 ENCOUNTER — Ambulatory Visit
Admission: RE | Admit: 2014-06-02 | Discharge: 2014-06-02 | Disposition: A | Discharge status: Home / Self Care | Payer: 59 | Source: Ambulatory Visit | Attending: Family Medicine | Admitting: Family Medicine

## 2014-06-02 ENCOUNTER — Other Ambulatory Visit: Payer: Self-pay | Admitting: Family Medicine

## 2014-06-02 DIAGNOSIS — M545 Low back pain: Secondary | ICD-10-CM

## 2014-06-02 DIAGNOSIS — M542 Cervicalgia: Secondary | ICD-10-CM

## 2014-06-02 DIAGNOSIS — R079 Chest pain, unspecified: Secondary | ICD-10-CM

## 2014-06-02 DIAGNOSIS — M79605 Pain in left leg: Secondary | ICD-10-CM

## 2014-12-29 ENCOUNTER — Other Ambulatory Visit: Payer: Self-pay | Admitting: Family Medicine

## 2014-12-29 DIAGNOSIS — M79604 Pain in right leg: Secondary | ICD-10-CM

## 2015-01-05 ENCOUNTER — Ambulatory Visit
Admission: RE | Admit: 2015-01-05 | Discharge: 2015-01-05 | Disposition: A | Discharge status: Home / Self Care | Payer: 59 | Source: Ambulatory Visit | Attending: Family Medicine | Admitting: Family Medicine

## 2015-01-05 DIAGNOSIS — M79604 Pain in right leg: Secondary | ICD-10-CM

## 2015-01-31 ENCOUNTER — Ambulatory Visit: Payer: 59 | Attending: Family Medicine | Admitting: Physical Therapy

## 2015-01-31 ENCOUNTER — Encounter: Payer: Self-pay | Admitting: Physical Therapy

## 2015-01-31 DIAGNOSIS — R42 Dizziness and giddiness: Secondary | ICD-10-CM | POA: Diagnosis present

## 2015-01-31 DIAGNOSIS — H8111 Benign paroxysmal vertigo, right ear: Secondary | ICD-10-CM | POA: Diagnosis present

## 2015-01-31 NOTE — Addendum Note (Signed)
Addended by: Billie Ruddy A on: 01/31/2015 07:40 PM   Modules accepted: Orders

## 2015-01-31 NOTE — Therapy (Signed)
[facility] [address] [place], [place], [address] Phone: [phone]   Fax:  [phone]  Physical Therapy Evaluation  Patient Details  Name: Sean Green MRN: [id number] Date of Birth: [birth year] Referring Provider: Jonathon Jordan, MD  Encounter Date: 01/31/2015      PT End of Session - 01/31/15 1254    Visit Number 1   Number of Visits 5  eval + 4 visits   Date for PT Re-Evaluation 03/02/15   Authorization Type UHC   PT Start Time 1146   PT Stop Time 1236   PT Time Calculation (min) 50 min   Activity Tolerance Patient tolerated treatment well   Behavior During Therapy [facility] for tasks assessed/performed      Past Medical History  Diagnosis Date  . BPH (benign prostatic hyperplasia)   . Hypogonadism male   . Pericarditis 2011  . Leukocytosis, unspecified   . Complication of anesthesia     woke up during hip surgery  . Hypertension     takes medication daily  . Dizziness   . Neuromuscular disorder ([place])     numbess/tingling in left hand intermittently  . Nocturia associated with benign prostatic hypertrophy   . PONV (postoperative nausea and vomiting)   . Anxiety   . Pneumonia X 1  . Myocardial infarction [facility]) 2011    vs pericarditis; "in hospital for 5 days"  . Arthritis     "joints" (09/08/2013)    Past Surgical History  Procedure Laterality Date  . Cardiac catheterization  02/2009  . Total hip arthroplasty Right 2008  . Shoulder arthroscopy w/ rotator cuff repair Right 2002  . Anterior cervical decomp/discectomy fusion  06/25/2011    Procedure: ANTERIOR CERVICAL DECOMPRESSION/DISCECTOMY FUSION 3 LEVELS;  Surgeon: Hosie Spangle, MD;  Location: [place];  Service: Neurosurgery;  Laterality: N/A;  Cervical Four-Five Cervical Five-Six Cervical Six-Seven Anterior Cervical Decompression with Fusion plating and bonegraft  . Carpal tunnel release Right ~ 1991  . Lumbar laminectomy Left 09/07/2013     L5-S1  . Joint replacement    . Back surgery    . Lumbar laminectomy/decompression microdiscectomy Left 09/07/2013    Procedure: LAMINECTOMY L5 - S1 ON THE LEFT 1 LEVEL;  Surgeon: Melina Schools, MD;  Location: [place];  Service: Orthopedics;  Laterality: Left;    There were no vitals filed for this visit.  Visit Diagnosis:  BPPV (benign paroxysmal positional vertigo), right  Dizziness and giddiness      Subjective Assessment - 01/31/15 1151    Subjective Pt has been out of work for last 2 weeks due to recent onset of dizziness. Pt states, "It's like my head can't catch up with my body." Pt also reports "the whole world was spinning" upon getting OOB and when pt bent over.  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 No/denies            [facility] PT Assessment - 01/31/15 0001    Assessment   Medical Diagnosis BPPV   Referring Provider Jonathon Jordan, MD   Onset Date/Surgical Date 01/08/15   Precautions   Precautions Fall   Restrictions   Weight Bearing Restrictions No   Balance Screen   Has the  patient fallen in the past 6 months No   Has the patient had a decrease in activity level because of a fear of falling?  Yes   Is the patient reluctant to leave their home because of a fear of falling?  Yes   [place] residence   Living Arrangements Spouse/significant other   Home Access Stairs to enter   Entrance Stairs-Number of Steps 4   Entrance Stairs-Rails Can reach both   [place] Two level;Full bath on main level   Alternate Level Stairs-Number of Steps 26  2 flights   Alternate Level Stairs-Rails Can reach both   [place] - 2 wheels;Cane - single point   Prior  Function   Level of Independence Independent   Vocation Full time employment   Vocation Requirements crawling, bending over, lifting   Cognition   Overall Cognitive Status Within Functional Limits for tasks assessed            Vestibular Assessment - 01/31/15 0001    Symptom Behavior   Type of Dizziness Spinning  blurred vision; "feeling funny in the head"   Duration of Dizziness spinning: < 1 minute. "funny feeling in head never goes away," per pt.   Aggravating Factors Looking up to the ceiling;Mornings;Sitting with head tilted back;Forward bending;Supine to sit   Relieving Factors Closing eyes;Comments  getting out of provoking position   Occulomotor Exam   Occulomotor Alignment Normal   Spontaneous Absent   Gaze-induced Left beating nystagmus with L gaze   Smooth Pursuits Intact   Saccades Poor trajectory  undershooting with horizontal > vertical   Comment R Head Thrust Test (+) and symptomatic   Vestibulo-Occular Reflex   VOR 1 Head Only (x 1 viewing) Increased symptoms with x1 viewing with horizontal head turns   Positional Testing   Dix-Hallpike Dix-Hallpike Right   Sidelying Test Sidelying Right;Sidelying Left   Dix-Hallpike Right   Dix-Hallpike Right Duration 12 seconds  with 5-sec latency   Dix-Hallpike Right Symptoms Upbeat, right rotatory nystagmus   Sidelying Right   Sidelying Right Duration 15 seconds  with 5 to 7-sec latency   Sidelying Right Symptoms Upbeat, right rotatory nystagmus   Sidelying Left   Sidelying Left Duration NA   Sidelying Left Symptoms No nystagmus                Vestibular Treatment/Exercise - 01/31/15 0001    Vestibular Treatment/Exercise   Vestibular Treatment Provided Canalith Repositioning   Canalith Repositioning Epley Manuever Right    EPLEY MANUEVER RIGHT   Number of Reps  2   Overall Response Improved Symptoms   Response Details  R Dix-Hallpike negative and asymptomatic upon reassessment.                  PT Short Term Goals - 01/31/15 1313    PT SHORT TERM GOAL #1   Title STG's = LTG's           PT Long Term Goals - 01/31/15 1930    PT LONG TERM GOAL #1   Title Positional vertigo testing will be negative to indicate resolved BPPV.  Target date: 02/28/15   PT LONG TERM GOAL #2   Title Pt will decrease DHI score from 58 to </= 40 to indicate significant decrease in pt-perceived disability due to dizziness. Target date: 02/28/15   PT LONG TERM GOAL #3   Title Pt will reach overhead to retrieve object from high shelf then place  object on low shelf 5 consecutive times without increased symptoms or LOB to progress toward pt ability to tolerate occupational tasks. Target date: 02/28/15   PT LONG TERM GOAL #4   Title Pt will independently negotiate 26 stairs with 2 rails to enable pt to safely access second floor of home. Target date: 02/28/15               Plan - 01/31/15 1258    Clinical Impression Statement Pt is a 60 y/o M referred to outpatient PT to address functional impairments associated with dizziness. PMH significant for: HTN, CAD, pericarditis, L5-S1 lamonectomy, cervical decompression, discectomy and fusion x3 levels. PT evaluation revealed the following impairments: R Head Thrust Test positive and symptomatic; R-sided gaze-evoked nystagmus; motion sensitivity with bed mobility; and R upbeating torsional nystagmus (with latency) accompanied by concordant vertiginous symptoms with R Sidelying Test. Based on findings, unable to rule out R posterior canalithiasis and R-sided vestibular hypofunction. Pt has not been able to return to work for past 2 weeks due to dizziness. Pt has also become sedentary and is reluctant to leave his home due to fear of falling. Performed R Epley x2 trials with improvement in symptoms. Pt will benefit from skilled outpatient PT 1x/week for 4 weeks, as needed, to address said impairments.   Pt will benefit from skilled therapeutic intervention in order to  improve on the following deficits Dizziness;Decreased activity tolerance;Decreased balance   Rehab Potential Good   PT Frequency 1x / week   PT Duration 4 weeks   PT Treatment/Interventions ADLs/Self Care Home Management;Gait training;Stair training;Functional mobility training;Therapeutic activities;Therapeutic exercise;Balance training;Patient/family education;Neuromuscular re-education;Canalith Repostioning;Vestibular   PT Next Visit Plan Assess for BPPV. Re-test R Head Thrust, VOR, and motion sensitivity and provide HEP, as needed.   Consulted and Agree with Plan of Care Patient         Problem List Patient Active Problem List   Diagnosis Date Noted  . Back pain 09/07/2013  . Hypertension   . BPH (benign prostatic hyperplasia)   . Leukocytosis, unspecified   . DYSLIPIDEMIA 03/27/2009  . HYPERTENSION, UNSPECIFIED 03/27/2009  . CORONARY ARTERY DISEASE 03/27/2009  . DEPRESSION, HX OF 03/27/2009  . TOBACCO ABUSE, HX OF 03/27/2009  . ACUTE IDIOPATHIC PERICARDITIS 03/18/2009   Billie Ruddy, PT, DPT [facility] [address] [place], [place], [address] Phone: [phone]   Fax:  [phone] 01/31/2015, 7:38 PM   Name: Sean Green MRN: [id number] Date of Birth: 1955-07-15

## 2015-02-07 ENCOUNTER — Ambulatory Visit: Payer: 59 | Admitting: Physical Therapy

## 2015-02-07 DIAGNOSIS — H8111 Benign paroxysmal vertigo, right ear: Secondary | ICD-10-CM | POA: Diagnosis not present

## 2015-02-07 DIAGNOSIS — R42 Dizziness and giddiness: Secondary | ICD-10-CM

## 2015-02-07 NOTE — Therapy (Signed)
[facility] [address] [place], [place], [address] Phone: [phone]   Fax:  [phone]  Physical Therapy Evaluation  Patient Details  Name: Sean Green MRN: [id number] Date of Birth: [birth year] Referring Provider: Jonathon Jordan, MD  Encounter Date: 02/07/2015      PT End of Session - 02/07/15 2140    Visit Number 2   Number of Visits 5   Date for PT Re-Evaluation 03/02/15   Authorization Type UHC   PT Start Time 0931   PT Stop Time 1013   PT Time Calculation (min) 42 min   Activity Tolerance Patient tolerated treatment well   Behavior During Therapy [facility] for tasks assessed/performed      Past Medical History  Diagnosis Date  . BPH (benign prostatic hyperplasia)   . Hypogonadism male   . Pericarditis 2011  . Leukocytosis, unspecified   . Complication of anesthesia     woke up during hip surgery  . Hypertension     takes medication daily  . Dizziness   . Neuromuscular disorder ([place])     numbess/tingling in left hand intermittently  . Nocturia associated with benign prostatic hypertrophy   . PONV (postoperative nausea and vomiting)   . Anxiety   . Pneumonia X 1  . Myocardial infarction [facility]) 2011    vs pericarditis; "in hospital for 5 days"  . Arthritis     "joints" (09/08/2013)    Past Surgical History  Procedure Laterality Date  . Cardiac catheterization  02/2009  . Total hip arthroplasty Right 2008  . Shoulder arthroscopy w/ rotator cuff repair Right 2002  . Anterior cervical decomp/discectomy fusion  06/25/2011    Procedure: ANTERIOR CERVICAL DECOMPRESSION/DISCECTOMY FUSION 3 LEVELS;  Surgeon: Hosie Spangle, MD;  Location: [place] [facility];  Service: Neurosurgery;  Laterality: N/A;  Cervical Four-Five Cervical Five-Six Cervical Six-Seven Anterior Cervical Decompression with Fusion plating and bonegraft  . Carpal tunnel release Right ~ 1991  . Lumbar laminectomy Left 09/07/2013    L5-S1  . Joint  replacement    . Back surgery    . Lumbar laminectomy/decompression microdiscectomy Left 09/07/2013    Procedure: LAMINECTOMY L5 - S1 ON THE LEFT 1 LEVEL;  Surgeon: Melina Schools, MD;  Location: [facility];  Service: Orthopedics;  Laterality: Left;    There were no vitals filed for this visit.  Visit Diagnosis:  Dizziness and giddiness      Subjective Assessment - 02/07/15 0933    Subjective "The spinning is gone, but now I feel like my brain is levatating out of my head." Symptoms worse when pt bends over.    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 No/denies                Vestibular Assessment - 02/07/15 0001    Vestibular Assessment   General Observation Subjective disequilibrium at rest = 1/5.    Symptom Behavior   Type of Dizziness "Funny feeling in head"   Duration of Dizziness seconds   Aggravating Factors Activity in general;Turning head quickly;Rolling to right;Rolling to left;Forward bending;Supine to sit   Relieving Factors Head stationary   Positional Testing   Sidelying Test Sidelying Right;Sidelying Left   Sidelying Right   Sidelying Right Duration < 3 seconds  "Spinning for just a split second," per pt.   Sidelying Right Symptoms Other (comment)  Unable  to visualize torsional component   Sidelying Left   Sidelying Left Duration NA   Sidelying Left Symptoms No nystagmus   Positional Sensitivities   Supine to Left Side Lightheadedness   Supine to Right Side Moderate dizziness   Head Turning x 5 Moderate dizziness   Head Nodding x 5 Lightheadedness  1/5 at baseline; no change   Positional Sensitivities Comments L sidelying > sit: 2/10.               [facility] Adult PT Treatment/Exercise - 02/07/15 0001    Ambulation/Gait   Ambulation/Gait Yes   Ambulation/Gait Assistance 6: Modified independent (Device/Increase time);5:  Supervision   Ambulation/Gait Assistance Details Mod I for all gait with exception of gait with horizontal head turns, with which pt required (S) due to veering to side of head movement and pt experienced increased disequilibrium.   Ambulation Distance (Feet) 210 Feet   Assistive device None   Gait Pattern Step-to pattern  Veers in direction of horizontal head turn   Ambulation Surface Level;Indoor         Vestibular Treatment/Exercise - 02/07/15 0001    Vestibular Treatment/Exercise   Vestibular Treatment Provided Habituation;Gaze   Habituation Exercises [name]   Gaze Exercises X1 Viewing Horizontal;X1 Viewing Vertical   [name]   Number of Reps  5   Symptom Description  Initially described as, "feels like my head is behind my body."    X1 Viewing Horizontal   Foot Position standing; feet shoulder width apart   Comments 30 seconds   X1 Viewing Vertical   Foot Position standing; feet shoulder width apart   Comments 30 seconds               PT Education - 02/07/15 1006    Education provided Yes   Education Details Habituation and gaze exercise initiated; see Pt Instructions for details.   Person(s) Educated Patient   Methods Explanation;Demonstration;Verbal cues;Handout   Comprehension Verbalized understanding;Returned demonstration          PT Short Term Goals - 01/31/15 1313    PT SHORT TERM GOAL #1   Title STG's = LTG's           PT Long Term Goals - 01/31/15 1930    PT LONG TERM GOAL #1   Title Positional vertigo testing will be negative to indicate resolved BPPV.  Target date: 02/28/15   PT LONG TERM GOAL #2   Title Pt will decrease DHI score from 58 to </= 40 to indicate significant decrease in pt-perceived disability due to dizziness. Target date: 02/28/15   PT LONG TERM GOAL #3   Title Pt will reach overhead to retrieve object from high shelf then place object on low shelf 5 consecutive times without increased symptoms or LOB to progress  toward pt ability to tolerate occupational tasks. Target date: 02/28/15   PT LONG TERM GOAL #4   Title Pt will independently negotiate 26 stairs with 2 rails to enable pt to safely access second floor of home. Target date: 02/28/15               Plan - 02/07/15 2140    Clinical Impression Statement Skilled session focused on initiating HEP to address ongoing feeling of disquilibrium with head/body movement. HEP focused on habituation and gaze stabilization exercises with effective within-session carryover from pt.   Pt will benefit from skilled therapeutic intervention in order to improve on the following deficits Dizziness;Decreased activity tolerance;Decreased balance   Rehab Potential  Good   PT Frequency 1x / week   PT Duration 4 weeks   PT Treatment/Interventions ADLs/Self Care Home Management;Gait training;Stair training;Functional mobility training;Therapeutic activities;Therapeutic exercise;Balance training;Patient/family education;Neuromuscular re-education;Canalith Repostioning;Vestibular   PT Next Visit Plan Assess HEP performance/compliance and motion sensitivity. Progress gaze home exercises. Add gait with head turns if safe/appropriate.   Consulted and Agree with Plan of Care Patient         Problem List Patient Active Problem List   Diagnosis Date Noted  . Back pain 09/07/2013  . Hypertension   . BPH (benign prostatic hyperplasia)   . Leukocytosis, unspecified   . DYSLIPIDEMIA 03/27/2009  . HYPERTENSION, UNSPECIFIED 03/27/2009  . CORONARY ARTERY DISEASE 03/27/2009  . DEPRESSION, HX OF 03/27/2009  . TOBACCO ABUSE, HX OF 03/27/2009  . ACUTE IDIOPATHIC PERICARDITIS 03/18/2009    Billie Ruddy, PT, DPT [facility] [address] [place], [place], [address] Phone: [phone]   Fax:  [phone] 02/07/2015, 9:44 PM   Name: Sean Green MRN: [id number] Date of Birth: 09-25-1955

## 2015-02-07 NOTE — Patient Instructions (Signed)
Tip Card 1.The goal of habituation training is to assist in decreasing symptoms of vertigo, dizziness, or nausea provoked by specific head and body motions. 2.These exercises may initially increase symptoms; however, be persistent and work through symptoms. With repetition and time, the exercises will assist in reducing or eliminating symptoms. 3.Exercises should be stopped and discussed with the therapist if you experience any of the following: - Sudden change or fluctuation in hearing - New onset of ringing in the ears, or increase in current intensity - Any fluid discharge from the ear - Severe pain in neck or back - Extreme nausea  Copyright  VHI. All rights reserved.  Rolling   With pillow under head, start on back. Roll to your right side.  Hold until dizziness stops, plus 20 seconds and then roll to the left side.  Hold until dizziness stops, plus 20 seconds.  Repeat sequence 5 times per session. Do 2 sessions per day.  Copyright  VHI. All rights reserved.  Sit to Side-Lying   Sit on edge of bed. Lie down onto the right side and hold until dizziness stops, plus 20 seconds.  Return to sitting and wait until dizziness stops, plus 20 seconds.  Repeat to the left side. Repeat sequence 5 times per session. Do 2 sessions per day.  Copyright  VHI. All rights reserved.  Gaze Stabilization: Tip Card 1.Target must remain in focus, not blurry, and appear stationary while head is in motion. 2.Perform exercises with small head movements (45 to either side of midline). 3.Increase speed of head motion so long as target is in focus. 4.If you wear eyeglasses, be sure you can see target through lens (therapist will give specific instructions for bifocal / progressive lenses). 5.These exercises may provoke dizziness or nausea. Work through these symptoms. If too dizzy, slow head movement slightly. Rest between each exercise. 6.Exercises demand concentration; avoid distractions. 7.For safety,  perform standing exercises close to a counter, wall, corner, or next to someone.  Copyright  VHI. All rights reserved.  Gaze Stabilization: Standing Feet Apart   Feet shoulder width apart, keeping eyes on target on wall 6 feet away, tilt head down slightly and move head side to side for 30 seconds. Repeat while moving head up and down for 30 seconds. Do 2 sessions per day.   Copyright  VHI. All rights reserved.

## 2015-02-13 ENCOUNTER — Ambulatory Visit: Payer: 59 | Admitting: Physical Therapy

## 2015-02-20 ENCOUNTER — Ambulatory Visit: Payer: 59 | Admitting: Physical Therapy

## 2015-02-20 DIAGNOSIS — H8111 Benign paroxysmal vertigo, right ear: Secondary | ICD-10-CM | POA: Diagnosis not present

## 2015-02-20 DIAGNOSIS — R42 Dizziness and giddiness: Secondary | ICD-10-CM

## 2015-02-20 NOTE — Therapy (Signed)
[facility] [address] [place], [place], [address] Phone: [phone]   Fax:  [phone]  Physical Therapy Treatment  Patient Details  Name: Sean Green MRN: [id number] Date of Birth: [birth year] Referring Provider: Jonathon Jordan, MD  Encounter Date: 02/20/2015      PT End of Session - 02/20/15 1014    Visit Number 3   Number of Visits 5   Date for PT Re-Evaluation 03/02/15   Authorization Type UHC   Activity Tolerance Patient tolerated treatment well   Behavior During Therapy [facility] for tasks assessed/performed      Past Medical History  Diagnosis Date  . BPH (benign prostatic hyperplasia)   . Hypogonadism male   . Pericarditis 2011  . Leukocytosis, unspecified   . Complication of anesthesia     woke up during hip surgery  . Hypertension     takes medication daily  . Dizziness   . Neuromuscular disorder ([place])     numbess/tingling in left hand intermittently  . Nocturia associated with benign prostatic hypertrophy   . PONV (postoperative nausea and vomiting)   . Anxiety   . Pneumonia X 1  . Myocardial infarction [facility]) 2011    vs pericarditis; "in hospital for 5 days"  . Arthritis     "joints" (09/08/2013)    Past Surgical History  Procedure Laterality Date  . Cardiac catheterization  02/2009  . Total hip arthroplasty Right 2008  . Shoulder arthroscopy w/ rotator cuff repair Right 2002  . Anterior cervical decomp/discectomy fusion  06/25/2011    Procedure: ANTERIOR CERVICAL DECOMPRESSION/DISCECTOMY FUSION 3 LEVELS;  Surgeon: Hosie Spangle, MD;  Location: [place] [facility];  Service: Neurosurgery;  Laterality: N/A;  Cervical Four-Five Cervical Five-Six Cervical Six-Seven Anterior Cervical Decompression with Fusion plating and bonegraft  . Carpal tunnel release Right ~ 1991  . Lumbar laminectomy Left 09/07/2013    L5-S1  . Joint replacement    . Back surgery    . Lumbar laminectomy/decompression  microdiscectomy Left 09/07/2013    Procedure: LAMINECTOMY L5 - S1 ON THE LEFT 1 LEVEL;  Surgeon: Melina Schools, MD;  Location: [facility];  Service: Orthopedics;  Laterality: Left;    There were no vitals filed for this visit.  Visit Diagnosis:  BPPV (benign paroxysmal positional vertigo), right  Dizziness and giddiness      Subjective Assessment - 02/20/15 0937    Subjective "I'm still feeling a little uneasy with head turns from side to side; but I had a head cold last week, so maybe that's why. 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 No/denies                Vestibular Assessment - 02/20/15 0001    Positional Testing   Horizontal Canal Testing Horizontal Canal Right;Horizontal Canal Left;Horizontal Canal Right Intensity;Horizontal Canal Left Intensity   Horizontal Canal Right   Horizontal Canal Right Duration < 20 seconds  R Horizontal Roll Test position   Horizontal Canal Right Symptoms Ageotrophic;Nystagmus  very difficult to visualize without Frenzel lenses   Horizontal Canal Left   Horizontal Canal Left Duration > 45 seconds  L Horizontal Roll Test position   Horizontal Canal Left Symptoms Ageotrophic;Nystagmus   Horizontal Canal Right Intensity  Horizontal Canal Right Intensity Mild   Horizontal Canal Left Intensity   Horizontal Canal Left Intensity Moderate   Positional Sensitivities   Supine to Left Side Mild dizziness   Supine to Right Side Mild dizziness                  Vestibular Treatment/Exercise - 02/20/15 0001    Vestibular Treatment/Exercise   Vestibular Treatment Provided Canalith Repositioning   Canalith Repositioning Comment;Canal Roll Right  LC Repositioning Maneuver described by Liborio Nixon al, 2011   Canal Roll Right    Number of Reps  1  following R HC cupololith repositioning (Kim, 2011) maneuver   Overall Response  Improved Symptoms   OTHER   Comment Performed L HC Cupulolith Repositioning Maneuver described by Liborio Nixon al, 2011 x1 trial with 3-minute holds at each position with vibrator on R mastoid process in 1st and 4th position. After said manuever, repeated Supine Roll Test with noted geotrophic nystagmus, intensity on R > L. Therefore, performed R Canal Roll maneuver x1 trial (noted geotrophic nystagmus during final position) with pt-reported resolve in symptoms. Did not retest due to time constraint.               PT Education - 02/20/15 2004    Education provided Yes   Education Details HEP: resume on 2/1.   Person(s) Educated Patient   Methods Explanation   Comprehension Verbalized understanding          PT Short Term Goals - 01/31/15 1313    PT SHORT TERM GOAL #1   Title STG's = LTG's           PT Long Term Goals - 01/31/15 1930    PT LONG TERM GOAL #1   Title Positional vertigo testing will be negative to indicate resolved BPPV.  Target date: 02/28/15   PT LONG TERM GOAL #2   Title Pt will decrease DHI score from 58 to </= 40 to indicate significant decrease in pt-perceived disability due to dizziness. Target date: 02/28/15   PT LONG TERM GOAL #3   Title Pt will reach overhead to retrieve object from high shelf then place object on low shelf 5 consecutive times without increased symptoms or LOB to progress toward pt ability to tolerate occupational tasks. Target date: 02/28/15   PT LONG TERM GOAL #4   Title Pt will independently negotiate 26 stairs with 2 rails to enable pt to safely access second floor of home. Target date: 02/28/15               Plan - 02/20/15 1014    Clinical Impression Statement Pt with new c/o vertiginous symptoms when rolling in bed. Testing for positional vertigo revealed ageotrophic nystagmus intensity on L > R. Therefore, unable to rule out R  horizontal cupulolithiasis. Performed L HC Cupulolith Repositioning Maneuver described by Liborio Nixon al, 2011 (see treatment note for details). After said manuever, repeated Supine Roll Test with noted geotrophic nystagmus, intensity on R > L. Therefore, performed R Canal Roll maneuver with pt-reported resolve in symptoms.    Pt will benefit from skilled therapeutic intervention in order to improve on the following deficits Dizziness;Decreased activity tolerance;Decreased balance   Rehab Potential Good   PT Frequency 1x / week   PT Duration 4 weeks   PT Treatment/Interventions ADLs/Self Care Home Management;Gait training;Stair training;Functional mobility training;Therapeutic activities;Therapeutic exercise;Balance training;Patient/family education;Neuromuscular re-education;Canalith Repostioning;Vestibular   PT Next Visit Plan Reassess for [facility] BPPV and treat as indicated.  Consulted and Agree with Plan of Care Patient        Problem List Patient Active Problem List   Diagnosis Date Noted  . Back pain 09/07/2013  . Hypertension   . BPH (benign prostatic hyperplasia)   . Leukocytosis, unspecified   . DYSLIPIDEMIA 03/27/2009  . HYPERTENSION, UNSPECIFIED 03/27/2009  . CORONARY ARTERY DISEASE 03/27/2009  . DEPRESSION, HX OF 03/27/2009  . TOBACCO ABUSE, HX OF 03/27/2009  . ACUTE IDIOPATHIC PERICARDITIS 03/18/2009    Billie Ruddy, PT, DPT [facility] [address] [place], [place], [address] Phone: [phone]   Fax:  [phone] 02/20/2015, 8:17 PM   Name: Sean Green MRN: [id number] Date of Birth: 1955/12/19

## 2015-02-21 ENCOUNTER — Ambulatory Visit: Payer: 59 | Attending: Family Medicine | Admitting: Physical Therapy

## 2015-02-21 DIAGNOSIS — H8111 Benign paroxysmal vertigo, right ear: Secondary | ICD-10-CM | POA: Diagnosis present

## 2015-02-21 DIAGNOSIS — R2681 Unsteadiness on feet: Secondary | ICD-10-CM | POA: Diagnosis present

## 2015-02-21 DIAGNOSIS — R42 Dizziness and giddiness: Secondary | ICD-10-CM | POA: Diagnosis present

## 2015-02-21 NOTE — Therapy (Signed)
[facility] [address] [place], [place], [address] Phone: [phone]   Fax:  [phone]  Physical Therapy Treatment  Patient Details  Name: Sean Green MRN: [id number] Date of Birth: [birth year] Referring Provider: Jonathon Jordan, MD  Encounter Date: 02/21/2015      PT End of Session - 02/21/15 1008    Visit Number 4   Number of Visits 5   Date for PT Re-Evaluation 03/02/15   Authorization Type UHC   PT Start Time 0928   PT Stop Time 1000   PT Time Calculation (min) 32 min   Activity Tolerance Patient tolerated treatment well   Behavior During Therapy [facility] for tasks assessed/performed      Past Medical History  Diagnosis Date  . BPH (benign prostatic hyperplasia)   . Hypogonadism male   . Pericarditis 2011  . Leukocytosis, unspecified   . Complication of anesthesia     woke up during hip surgery  . Hypertension     takes medication daily  . Dizziness   . Neuromuscular disorder ([place])     numbess/tingling in left hand intermittently  . Nocturia associated with benign prostatic hypertrophy   . PONV (postoperative nausea and vomiting)   . Anxiety   . Pneumonia X 1  . Myocardial infarction [facility]) 2011    vs pericarditis; "in hospital for 5 days"  . Arthritis     "joints" (09/08/2013)    Past Surgical History  Procedure Laterality Date  . Cardiac catheterization  02/2009  . Total hip arthroplasty Right 2008  . Shoulder arthroscopy w/ rotator cuff repair Right 2002  . Anterior cervical decomp/discectomy fusion  06/25/2011    Procedure: ANTERIOR CERVICAL DECOMPRESSION/DISCECTOMY FUSION 3 LEVELS;  Surgeon: Hosie Spangle, MD;  Location: [place] [facility];  Service: Neurosurgery;  Laterality: N/A;  Cervical Four-Five Cervical Five-Six Cervical Six-Seven Anterior Cervical Decompression with Fusion plating and bonegraft  . Carpal tunnel release Right ~ 1991  . Lumbar laminectomy Left 09/07/2013    L5-S1  . Joint  replacement    . Back surgery    . Lumbar laminectomy/decompression microdiscectomy Left 09/07/2013    Procedure: LAMINECTOMY L5 - S1 ON THE LEFT 1 LEVEL;  Surgeon: Melina Schools, MD;  Location: [place];  Service: Orthopedics;  Laterality: Left;    There were no vitals filed for this visit.  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 No/denies                Vestibular Assessment - 02/21/15 0001    Positional Testing   Dix-Hallpike Dix-Hallpike Right   Sidelying Test Sidelying Right;Sidelying Left   Horizontal Canal Testing Horizontal Canal Right;Horizontal Canal Left   Dix-Hallpike Right   Dix-Hallpike Right Duration 10 seconds   Dix-Hallpike Right Symptoms Upbeat, right rotatory nystagmus   Sidelying Right   Sidelying Right Duration 10 seconds   Sidelying Right Symptoms Upbeat, right rotatory nystagmus   Sidelying Left   Sidelying Left Duration NA   Sidelying Left Symptoms No nystagmus   Horizontal Canal Right   Horizontal Canal Right Duration NA   Horizontal Canal Right Symptoms Normal  Horizontal Canal Left   Horizontal Canal Left Duration NA   Horizontal Canal Left Symptoms Normal                 [facility] Adult PT Treatment/Exercise - 02/21/15 0001    Ambulation/Gait   Ambulation/Gait Yes   Ambulation/Gait Assistance 6: Modified independent (Device/Increase time)   Ambulation Distance (Feet) 200 Feet   Assistive device None   Gait Pattern Step-through pattern   Ambulation Surface Level;Indoor         Vestibular Treatment/Exercise - 02/21/15 0001    Vestibular Treatment/Exercise   Vestibular Treatment Provided  Canalith Repositioning   Canalith Repositioning Epley Manuever Right    EPLEY MANUEVER RIGHT   Number of Reps  2   Overall Response Symptoms Resolved   Response Details  R Dix-Hallpike negative, asymptomatic upon reassessment after R Epley x2 trials.               PT Education - 02/21/15 1007    Education provided Yes   Education Details Recommended pt resume HEP today.   Person(s) Educated Patient   Methods Explanation;Demonstration;Handout   Comprehension Verbalized understanding          PT Short Term Goals - 01/31/15 1313    PT SHORT TERM GOAL #1   Title STG's = LTG's           PT Long Term Goals - 02/21/15 1011    PT LONG TERM GOAL #1   Title Positional vertigo testing will be negative to indicate resolved BPPV.  Target date: 02/28/15   Status On-going   PT LONG TERM GOAL #2   Title Pt will decrease DHI score from 58 to </= 40 to indicate significant decrease in pt-perceived disability due to dizziness. Target date: 02/28/15   Status On-going   PT LONG TERM GOAL #3   Title Pt will reach overhead to retrieve object from high shelf then place object on low shelf 5 consecutive times without increased symptoms or LOB to progress toward pt ability to tolerate occupational tasks. Target date: 02/28/15   Status On-going   PT LONG TERM GOAL #4   Title Pt will independently negotiate 26 stairs with 2 rails to enable pt to safely access second floor of home. Target date: 02/28/15   Status On-going               Plan - 02/21/15 1009    Clinical Impression Statement Upon reassessment for BPPV, horizontal canal BPPV no longer present; however, pt with R upbeating torsional nystagmus on R Sidelying Test and R Dix-Hallpike. Performed Epley x2 trials with pt-reported resolve in symtoms. R Dix-Hallpike negative post-session. Recommended pt resume vestibular HEP at this time.   Pt will benefit from skilled therapeutic intervention in order to improve on the following deficits  Dizziness;Decreased activity tolerance;Decreased balance   Rehab Potential Good   PT Frequency 1x / week   PT Duration 4 weeks   PT Treatment/Interventions ADLs/Self Care Home Management;Gait training;Stair training;Functional mobility training;Therapeutic activities;Therapeutic exercise;Balance training;Patient/family education;Neuromuscular re-education;Canalith Repostioning;Vestibular   PT Next Visit Plan Reassess for BPPV and treat as indicated. Consider recert if pt continuing to c/o disequilibrium, dizziness.   Consulted and Agree with Plan of Care Patient        Problem List Patient Active Problem List   Diagnosis Date Noted  . Back pain 09/07/2013  . Hypertension   . BPH (benign prostatic hyperplasia)   . Leukocytosis, unspecified   . DYSLIPIDEMIA 03/27/2009  . HYPERTENSION,  UNSPECIFIED 03/27/2009  . CORONARY ARTERY DISEASE 03/27/2009  . DEPRESSION, HX OF 03/27/2009  . TOBACCO ABUSE, HX OF 03/27/2009  . ACUTE IDIOPATHIC PERICARDITIS 03/18/2009    Billie Ruddy, PT, DPT [facility] [address] [place], [place], [address] Phone: [phone]   Fax:  [phone] 02/21/2015, 10:13 AM   Name: Sean Green MRN: [id number] Date of Birth: 09-19-1955

## 2015-02-27 ENCOUNTER — Ambulatory Visit: Payer: 59 | Admitting: Physical Therapy

## 2015-02-27 DIAGNOSIS — H8111 Benign paroxysmal vertigo, right ear: Secondary | ICD-10-CM

## 2015-02-27 DIAGNOSIS — R42 Dizziness and giddiness: Secondary | ICD-10-CM

## 2015-02-27 DIAGNOSIS — R2681 Unsteadiness on feet: Secondary | ICD-10-CM

## 2015-02-27 NOTE — Therapy (Signed)
[place] [address] [place], [place], [address] Phone: [phone]   Fax:  [phone]  Physical Therapy Treatment  Patient Details  Name: Sean Green MRN: [id number] Date of Birth: 1955/08/29 Referring Provider: Jonathon Jordan, MD  Encounter Date: 02/27/2015      PT End of Session - 02/27/15 1941    Visit Number 5   Number of Visits 13  Requesting 8 additional PT sessions   Date for PT Re-Evaluation 03/29/15   Authorization Type UHC   PT Start Time 0932   PT Stop Time 1016   PT Time Calculation (min) 44 min   Equipment Utilized During Treatment Gait belt   Activity Tolerance Other (comment)  Limited by headache and nausea   Behavior During Therapy [facility] for tasks assessed/performed      Past Medical History  Diagnosis Date  . BPH (benign prostatic hyperplasia)   . Hypogonadism male   . Pericarditis 2011  . Leukocytosis, unspecified   . Complication of anesthesia     woke up during hip surgery  . Hypertension     takes medication daily  . Dizziness   . Neuromuscular disorder ([place])     numbess/tingling in left hand intermittently  . Nocturia associated with benign prostatic hypertrophy   . PONV (postoperative nausea and vomiting)   . Anxiety   . Pneumonia X 1  . Myocardial infarction [facility]) 2011    vs pericarditis; "in hospital for 5 days"  . Arthritis     "joints" (09/08/2013)    Past Surgical History  Procedure Laterality Date  . Cardiac catheterization  02/2009  . Total hip arthroplasty Right 2008  . Shoulder arthroscopy w/ rotator cuff repair Right 2002  . Anterior cervical decomp/discectomy fusion  06/25/2011    Procedure: ANTERIOR CERVICAL DECOMPRESSION/DISCECTOMY FUSION 3 LEVELS;  Surgeon: Hosie Spangle, MD;  Location: [place] [facility];  Service: Neurosurgery;  Laterality: N/A;  Cervical Four-Five Cervical Five-Six Cervical Six-Seven Anterior Cervical Decompression with Fusion plating and  bonegraft  . Carpal tunnel release Right ~ 1991  . Lumbar laminectomy Left 09/07/2013    L5-S1  . Joint replacement    . Back surgery    . Lumbar laminectomy/decompression microdiscectomy Left 09/07/2013    Procedure: LAMINECTOMY L5 - S1 ON THE LEFT 1 LEVEL;  Surgeon: Melina Schools, MD;  Location: [place];  Service: Orthopedics;  Laterality: Left;    There were no vitals filed for this visit.  Visit Diagnosis:  Dizziness and giddiness - Plan: PT plan of care cert/re-cert  BPPV (benign paroxysmal positional vertigo), right - Plan: PT plan of care cert/re-cert  Unsteadiness - Plan: PT plan of care cert/re-cert      Subjective Assessment - 02/27/15 0938    Subjective Pt reports no spinning "for 2 or 3 days," per pt. However, pt does perceive "a problem with my balance."  After pt difficulty tolerating SOT, pt expressing desire to continue PT.    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 No/denies            [facility] PT Assessment - 02/27/15 0001    Assessment   Medical Diagnosis BPPV   Referring Provider Jonathon Jordan, MD   Onset Date/Surgical Date 01/08/15            Vestibular Assessment - 02/27/15 0001    Positional Testing  Sidelying Test Sidelying Right;Sidelying Left   Sidelying Right   Sidelying Right Duration Approx 5 seconds  No nystagus, symptoms after [name] x2 trials   Sidelying Right Symptoms Upbeat, right rotatory nystagmus   Sidelying Left   Sidelying Left Duration NA   Sidelying Left Symptoms No nystagmus   Equities trader Comment Attempted SOT on Pension scheme manager; however, pt unable to complete due to significant dizziness and nausea after condition 3.                   Vestibular Treatment/Exercise - 02/27/15 0001    Vestibular Treatment/Exercise    Vestibular Treatment Provided Habituation   Habituation Exercises [name]   [name]   Number of Reps  5   Symptom Description  Initially 2/10 with R sidelying and L side lying > sit; 1/10 with R side lying > sit. On last rep, 0/10 wwith sit > R sidelying and 1/10 with R/L sidelying > sit.               PT Education - 02/27/15 1928    Education provided Yes   Education Details Reiterated importance of performing HEP daily to maximize progress in PT.  To promote consistent pt compliance, modified HEP to only sit <> sidelying for habituation.  Discussed goals, progress, and POC.   Person(s) Educated Patient   Methods Explanation;Demonstration   Comprehension Verbalized understanding;Returned demonstration          PT Short Term Goals - 01/31/15 1313    PT SHORT TERM GOAL #1   Title STG's = LTG's           PT Long Term Goals - 02/27/15 1924    PT LONG TERM GOAL #1   Title Positional vertigo testing will be negative to indicate resolved BPPV.  Modified Target date: 03/27/15   Baseline 2/7: On R Sidelying Test, noted < 5 seconds of R upbeating torsional nystagmus, minimal sypmtoms.  Continue goal through renewed POC.   Status Partially Met   PT LONG TERM GOAL #2   Title Pt will decrease DHI score from 58 to </= 40 to indicate significant decrease in pt-perceived disability due to dizziness.  Modified Target date: 03/27/15   Baseline 2/7: DHI increased from 58 to 76 (however, this was immediately after pt unable to finish SOT due to headache and nausea).  Continue goal through renewed POC.   Status Not Met   PT LONG TERM GOAL #3   Title Pt will reach overhead to retrieve object from high shelf then place object on low shelf 5 consecutive times without increased symptoms or LOB to progress toward pt ability to tolerate occupational tasks.  Modified Target date: 03/27/15   Baseline 2/7: Unable to assess due to headache nausea after attempted SOT.  Continue goal through  renewed POC.   Status On-going   PT LONG TERM GOAL #4   Title Pt will independently negotiate 26 stairs with 2 rails to enable pt to safely access second floor of home.  Modified Target date: 03/27/15   Baseline 2/7: Unable to assess due to headache nausea after attempted SOT.  Continue goal through renewed POC.   Status On-going   PT LONG TERM GOAL #5   Title Pt will independently perform home exercises to indicate daily compliance with HEP, maximize functional gains made in PT.   Target date: 03/27/15   Status New   Additional Long Term Goals  Additional Long Term Goals Yes   PT LONG TERM GOAL #6   Title Perform DGI and improve score to > 19/24 to indicate decreased fall risk.    Target date: 03/27/15   Status New               Plan - 02/27/15 1943    Clinical Impression Statement Since beginning this episode of outpatient PT, patient has been treated for R posterior canalithaisis during 2 sessions and for R horizontal canal cupulolithiasis during one session. R horizontal cupulolithiasis has resolved. R posterior canal BPPV still present (< 5 seconds with minimal symptoms upon reassessment) but not impacting mobility at this time. Pt reports he has been performing habituation HEP; however, pt unable to demonstrate habituation without cueing. Despite improvement of BPPV, pt continues to report perception of imbalance. Therefore, attempted to perform SOT during this session, but pt unable to tolerate test beyond Condition 3 due to onset of significant headache and nausea. Due to functional impairments, recommend vestibular PT 2x/weekfor 4 additional weeks. This PT reiterated importance of daily HEP compliance with verbal agreement/understanding from pt.    Pt will benefit from skilled therapeutic intervention in order to improve on the following deficits Dizziness;Decreased activity tolerance;Decreased balance   Rehab Potential Good   Clinical Impairments Affecting Rehab Potential  inconsistent HEP compliance   PT Frequency 2x / week   PT Duration 4 weeks   PT Treatment/Interventions ADLs/Self Care Home Management;Gait training;Stair training;Functional mobility training;Therapeutic activities;Therapeutic exercise;Balance training;Patient/family education;Neuromuscular re-education;Canalith Repostioning;Vestibular;Manual techniques   PT Next Visit Plan Reassess for BPPV and treat as indicated. Perform DGI. Progress vestibular HEP prn.   Consulted and Agree with Plan of Care Patient        Problem List Patient Active Problem List   Diagnosis Date Noted  . Back pain 09/07/2013  . Hypertension   . BPH (benign prostatic hyperplasia)   . Leukocytosis, unspecified   . DYSLIPIDEMIA 03/27/2009  . HYPERTENSION, UNSPECIFIED 03/27/2009  . CORONARY ARTERY DISEASE 03/27/2009  . DEPRESSION, HX OF 03/27/2009  . TOBACCO ABUSE, HX OF 03/27/2009  . ACUTE IDIOPATHIC PERICARDITIS 03/18/2009    Billie Ruddy, PT, DPT [facility] [address] [place], [place], [address] Phone: [phone]   Fax:  [phone] 02/27/2015, 7:56 PM   Name: Sean Green MRN: [id number] Date of Birth: 03-11-1955

## 2015-02-27 NOTE — Patient Instructions (Signed)
Tip Card 1.The goal of habituation training is to assist in decreasing symptoms of vertigo, dizziness, or nausea provoked by specific head and body motions. 2.These exercises may initially increase symptoms; however, be persistent and work through symptoms. With repetition and time, the exercises will assist in reducing or eliminating symptoms. 3.Exercises should be stopped and discussed with the therapist if you experience any of the following: - Sudden change or fluctuation in hearing - New onset of ringing in the ears, or increase in current intensity - Any fluid discharge from the ear - Severe pain in neck or back - Extreme nausea   Copyright  [employer]. All rights reserved.  Sit to Side-Lying   Sit on edge of bed. Lie down onto the right side and hold until dizziness stops, plus 20 seconds.  Return to sitting and wait until dizziness stops, plus 20 seconds.  Repeat to the left side. Repeat sequence 5 times per session. Do 2 sessions per day.     

## 2015-03-06 ENCOUNTER — Ambulatory Visit: Payer: 59 | Admitting: Rehabilitative and Restorative Service Providers"

## 2015-03-06 DIAGNOSIS — H8111 Benign paroxysmal vertigo, right ear: Secondary | ICD-10-CM

## 2015-03-06 DIAGNOSIS — R42 Dizziness and giddiness: Secondary | ICD-10-CM

## 2015-03-06 DIAGNOSIS — R2681 Unsteadiness on feet: Secondary | ICD-10-CM

## 2015-03-06 NOTE — Therapy (Signed)
[facility] [address] [place], [place], [address] Phone: [phone]   Fax:  [phone]  Physical Therapy Treatment  Patient Details  Name: Sean Green MRN: [id number] Date of Birth: [birth year] Referring Provider: Jonathon Jordan, MD  Encounter Date: 03/06/2015      PT End of Session - 03/06/15 1505    Visit Number 6   Number of Visits 13  Requesting 8 additional PT sessions   Date for PT Re-Evaluation 03/29/15   Authorization Type U[employer]   PT Start Time 8546   PT Stop Time 1018   PT Time Calculation (min) 43 min   Equipment Utilized During Treatment Gait belt   Activity Tolerance Patient tolerated treatment well   Behavior During Therapy [facility] for tasks assessed/performed      Past Medical History  Diagnosis Date  . BPH (benign prostatic hyperplasia)   . Hypogonadism male   . Pericarditis 2011  . Leukocytosis, unspecified   . Complication of anesthesia     woke up during hip surgery  . Hypertension     takes medication daily  . Dizziness   . Neuromuscular disorder ([place])     numbess/tingling in left hand intermittently  . Nocturia associated with benign prostatic hypertrophy   . PONV (postoperative nausea and vomiting)   . Anxiety   . Pneumonia X 1  . Myocardial infarction [facility]) 2011    vs pericarditis; "in hospital for 5 days"  . Arthritis     "joints" (09/08/2013)    Past Surgical History  Procedure Laterality Date  . Cardiac catheterization  02/2009  . Total hip arthroplasty Right 2008  . Shoulder arthroscopy w/ rotator cuff repair Right 2002  . Anterior cervical decomp/discectomy fusion  06/25/2011    Procedure: ANTERIOR CERVICAL DECOMPRESSION/DISCECTOMY FUSION 3 LEVELS;  Surgeon: Hosie Spangle, MD;  Location: [facility];  Service: Neurosurgery;  Laterality: N/A;  Cervical Four-Five Cervical Five-Six Cervical Six-Seven Anterior Cervical Decompression with Fusion plating and bonegraft  .  Carpal tunnel release Right ~ 1991  . Lumbar laminectomy Left 09/07/2013    L5-S1  . Joint replacement    . Back surgery    . Lumbar laminectomy/decompression microdiscectomy Left 09/07/2013    Procedure: LAMINECTOMY L5 - S1 ON THE LEFT 1 LEVEL;  Surgeon: Melina Schools, MD;  Location: [facility];  Service: Orthopedics;  Laterality: Left;    There were no vitals filed for this visit.  Visit Diagnosis:  Dizziness and giddiness  BPPV (benign paroxysmal positional vertigo), right  Unsteadiness      Subjective Assessment - 03/06/15 0936    Subjective The patient is doing HEP, but reports his tall bed limits his performance.  He reports continued dizziness that is intermittent in nature, worse upon rising.  Worsening symptoms of lightheadedness with looking to the left.  Describes "feels like my brain is floating".    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 No/denies                Vestibular Assessment - 03/06/15 0939    Vestibular Assessment   General Observation Feeling that head and body are not moving together.   Symptom Behavior   Type of Dizziness Lightheadedness  spinning   Positional Testing   Dix-Hallpike Dix-Hallpike Right;Dix-Hallpike Left   Sidelying Test Sidelying Right;Sidelying Left   Horizontal Canal Testing Horizontal Canal Right;Horizontal Canal Left   Dix-Hallpike Right   Dix-Hallpike Right  Duration <5 beats viewed in room light   Dix-Hallpike Right Symptoms Upbeat, right rotatory nystagmus   Dix-Hallpike Left   Dix-Hallpike Left Duration none   Dix-Hallpike Left Symptoms No nystagmus   Sidelying Right   Sidelying Right Duration dizziness with return to sitting described as lightheaded   Sidelying Right Symptoms No nystagmus   Sidelying Left   Sidelying Left Duration dizziness with return to sitting described as lightheadedness   Sidelying Left Symptoms No nystagmus   Horizontal Canal Right   Horizontal  Canal Right Duration none   Horizontal Canal Right Symptoms Normal   Horizontal Canal Left   Horizontal Canal Left Duration none   Horizontal Canal Left Symptoms Normal   Orthostatics   BP supine (x 5 minutes) 128/88 mmHg   HR supine (x 5 minutes) 97   BP standing (after 1 minute) 102/78 mmHg  Patient lost balance and needed assist to recover   HR standing (after 1 minute) 112  reports lightheadedness   BP standing (after 3 minutes) 108/75 mmHg   HR standing (after 3 minutes) 110   Orthostatics Comment Symtpoms were significant upon first rising                   Vestibular Treatment/Exercise - 03/06/15 1512    Vestibular Treatment/Exercise   Vestibular Treatment Provided Habituation;Gaze   Habituation Exercises Brandt Daroff;Standing Horizontal Head Turns;Standing Vertical Head Turns   OTHER   Comment Performed standing feet together + eyes closed in corner with support surface near for HEP to work on balance.   [name]   Number of Reps  1   Symptom Description  Patient only dizzy with return to sit further described as "lightheaded".  D/c from HEP at this time due to + for orthostatic hypotension   Standing Horizontal Head Turns   Number of Reps  5  x 2 sets   Symptom Description  Patient reports 6/10 symptoms with quick head turns.  Rested and repeated at slower speed with 4/10.  Recommended patient begin at slow pace.   Standing Vertical Head Turns   Number of Reps  5   Symptom Description  Performed at slow pace with minimal symptoms reported at 2-3/10.   X1 Viewing Horizontal   Foot Position standing feet apart   Reps 3  x 30 seconds    Comments Patinet reports initially 5-6/10, rested and had patient perform at slowed pace and he reports 4/10 symptoms.        SELF CARE/HOME MANAGEMENT: Provided handout + education on postural hypotension from [facility] toolkit.          PT Education - 03/06/15 1504    Education provided Yes   Education  Details Modified HEP based on readings from orthostatics.  Although brandt daroff indicated for vertigo, recommended hold due to orthostatic measurements.  Added balance HEP, reviewed gaze exercises.   Person(s) Educated Patient   Methods Explanation;Demonstration   Comprehension Verbalized understanding;Returned demonstration          PT Short Term Goals - 01/31/15 1313    PT SHORT TERM GOAL #1   Title STG's = LTG's           PT Long Term Goals - 02/27/15 1924    PT LONG TERM GOAL #1   Title Positional vertigo testing will be negative to indicate resolved BPPV.  Modified Target date: 03/27/15   Baseline 2/7: On R Sidelying Test, noted < 5 seconds of R upbeating torsional nystagmus,  minimal sypmtoms.  Continue goal through renewed POC.   Status Partially Met   PT LONG TERM GOAL #2   Title Pt will decrease DHI score from 58 to </= 40 to indicate significant decrease in pt-perceived disability due to dizziness.  Modified Target date: 03/27/15   Baseline 2/7: DHI increased from 58 to 76 (however, this was immediately after pt unable to finish SOT due to headache and nausea).  Continue goal through renewed POC.   Status Not Met   PT LONG TERM GOAL #3   Title Pt will reach overhead to retrieve object from high shelf then place object on low shelf 5 consecutive times without increased symptoms or LOB to progress toward pt ability to tolerate occupational tasks.  Modified Target date: 03/27/15   Baseline 2/7: Unable to assess due to headache nausea after attempted SOT.  Continue goal through renewed POC.   Status On-going   PT LONG TERM GOAL #4   Title Pt will independently negotiate 26 stairs with 2 rails to enable pt to safely access second floor of home.  Modified Target date: 03/27/15   Baseline 2/7: Unable to assess due to headache nausea after attempted SOT.  Continue goal through renewed POC.   Status On-going   PT LONG TERM GOAL #5   Title Pt will independently perform home exercises  to indicate daily compliance with HEP, maximize functional gains made in PT.   Target date: 03/27/15   Status New   Additional Long Term Goals   Additional Long Term Goals Yes   PT LONG TERM GOAL #6   Title Perform DGI and improve score to > 19/24 to indicate decreased fall risk.    Target date: 03/27/15   Status New               Plan - 03/06/15 1506    Clinical Impression Statement The patient continues with <5 beats of nystagmus viewed with R dix hallpike testing.  He does not experience accompanying subjective dizziness.  At today's session, he c/o lightheadedness when returning to sit and orthostatics were positive for >97WYOV change on systolic +78 mmHG change on diastolic + subjective symptoms of lightheadedness with imbalance upon standing.  The patient appears to have multiple factors contributing to current functional limitations including decreased VOR (reports today that head/eyes don't feel like they work together during mobility), positional component to vertigo (h/o multi-canal BPPV, only see trace nytagmus in room light today), and orthostatic component to symptoms.  PT to contact MD re: today's readings on orthostatic measurements.   Pt will benefit from skilled therapeutic intervention in order to improve on the following deficits Dizziness;Decreased activity tolerance;Decreased balance   Rehab Potential Good   PT Frequency 2x / week   PT Duration 4 weeks   PT Treatment/Interventions ADLs/Self Care Home Management;Gait training;Stair training;Functional mobility training;Therapeutic activities;Therapeutic exercise;Balance training;Patient/family education;Neuromuscular re-education;Canalith Repostioning;Vestibular;Manual techniques   PT Next Visit Plan Check HEP, perform DGI, standing or sitting habituation activities near support surface (due to postural hypotension).   Consulted and Agree with Plan of Care Patient        Problem List Patient Active Problem List    Diagnosis Date Noted  . Back pain 09/07/2013  . Hypertension   . BPH (benign prostatic hyperplasia)   . Leukocytosis, unspecified   . DYSLIPIDEMIA 03/27/2009  . HYPERTENSION, UNSPECIFIED 03/27/2009  . CORONARY ARTERY DISEASE 03/27/2009  . DEPRESSION, HX OF 03/27/2009  . TOBACCO ABUSE, HX OF 03/27/2009  . ACUTE IDIOPATHIC PERICARDITIS  03/18/2009    Kery Haltiwanger, PT 03/06/2015, 3:15 PM  [facility] [address] [place], [place], [address] Phone: [phone]   Fax:  [phone]  Name: Sean Green MRN: [id number] Date of Birth: Dec 06, 1955

## 2015-03-06 NOTE — Patient Instructions (Signed)
Gaze Stabilization: Tip Card 1.Target must remain in focus, not blurry, and appear stationary while head is in motion. 2.Perform exercises with small head movements (45 to either side of midline). 3.Increase speed of head motion so long as target is in focus. 4.If you wear eyeglasses, be sure you can see target through lens (therapist will give specific instructions for bifocal / progressive lenses). 5.These exercises may provoke dizziness or nausea. Work through these symptoms. If too dizzy, slow head movement slightly. Rest between each exercise. 6.Exercises demand concentration; avoid distractions. 7.For safety, perform standing exercises close to a counter, wall, corner, or next to someone.  Copyright  VHI. All rights reserved.  Gaze Stabilization: Standing Feet Apart   Feet shoulder width apart, keeping eyes on target on wall 3 feet away, tilt head down slightly and move head side to side for 30 seconds. Repeat while moving head up and down for 30 seconds. Do 2 sessions per day.   Copyright  VHI. All rights reserved.   Feet Together, Varied Arm Positions - Eyes Closed    Stand with feet together and arms at your side. Close eyes and visualize upright position. Hold ___30_ seconds. Repeat 3____ times per session. Do _2___ sessions per day. Copyright  VHI. All rights reserved.   Feet Together, Head Motion - Eyes Open    With eyes open, feet together, move head slowly: up and down.  Rest, repeat x 5 times side to side. Repeat _5___ times per session. Do __2__ sessions per day.  Copyright  VHI. All rights reserved.   FOR SAFETY, perform in the corner with a tall chair/barstool in front of you. Exercises should provoke 4/10 dizziness.  Stop and rest if symptoms exceed this limit.

## 2015-03-09 ENCOUNTER — Encounter: Payer: 59 | Admitting: Physical Therapy

## 2015-03-12 ENCOUNTER — Encounter: Payer: 59 | Admitting: Physical Therapy

## 2015-03-16 ENCOUNTER — Ambulatory Visit: Payer: 59 | Admitting: Physical Therapy

## 2015-03-16 DIAGNOSIS — H8111 Benign paroxysmal vertigo, right ear: Secondary | ICD-10-CM | POA: Diagnosis not present

## 2015-03-16 DIAGNOSIS — R42 Dizziness and giddiness: Secondary | ICD-10-CM

## 2015-03-16 DIAGNOSIS — R2681 Unsteadiness on feet: Secondary | ICD-10-CM

## 2015-03-16 NOTE — Patient Instructions (Addendum)
Gaze Stabilization: Tip Card 1.Target must remain in focus, not blurry, and appear stationary while head is in motion. 2.Perform exercises with small head movements (45 to either side of midline). 3.Increase speed of head motion so long as target is in focus. 4.If you wear eyeglasses, be sure you can see target through lens (therapist will give specific instructions for bifocal / progressive lenses). 5.These exercises may provoke dizziness or nausea. Work through these symptoms. If too dizzy, slow head movement slightly. Rest between each exercise. 6.Exercises demand concentration; avoid distractions. 7.For safety, perform standing exercises close to a counter, wall, corner, or next to someone.  Copyright  VHI. All rights reserved.  Gaze Stabilization: Standing Feet Apart   Feet shoulder width apart, keeping eyes on target on wall 6 feet away, tilt head down slightly and move head side to side for 30 seconds. Repeat while moving head up and down for 30 seconds. Do 2 sessions per day.  Balance: Eyes Closed - Bilateral (Varied Surfaces)    Stand with your back to a corner with a stable chair in front of you. Stand with feet shoulder width apart on 1 pillow and close eyes. Maintain balance __30__ seconds. Repeat __3_ times per set. Do _2__ sessions per day.   Feet Together (Compliant Surface) Head Motion - Eyes Open    With eyes open, standing on 1 pillow with eyes open, move head slowly: up and down 5 times; right to left 5 times. Repeat __3__ times per session. Do __2__ sessions per day.  Copyright  VHI. All rights reserved.    FOR SAFETY, perform in the corner with a tall chair/barstool in front of you. Exercises should provoke 4/10 dizziness. Stop and rest if symptoms exceed this limit.

## 2015-03-18 NOTE — Therapy (Signed)
[facility] [address] [place], [place], [address] Phone: [phone]   Fax:  [phone]  Physical Therapy Treatment  Patient Details  Name: Sean Green MRN: [id number] Date of Birth: 1955/06/18 Referring Provider: Jonathon Jordan, MD  Encounter Date: 03/16/2015    Past Medical History  Diagnosis Date  . BPH (benign prostatic hyperplasia)   . Hypogonadism male   . Pericarditis 2011  . Leukocytosis, unspecified   . Complication of anesthesia     woke up during hip surgery  . Hypertension     takes medication daily  . Dizziness   . Neuromuscular disorder ([place])     numbess/tingling in left hand intermittently  . Nocturia associated with benign prostatic hypertrophy   . PONV (postoperative nausea and vomiting)   . Anxiety   . Pneumonia X 1  . Myocardial infarction [facility]) 2011    vs pericarditis; "in hospital for 5 days"  . Arthritis     "joints" (09/08/2013)    Past Surgical History  Procedure Laterality Date  . Cardiac catheterization  02/2009  . Total hip arthroplasty Right 2008  . Shoulder arthroscopy w/ rotator cuff repair Right 2002  . Anterior cervical decomp/discectomy fusion  06/25/2011    Procedure: ANTERIOR CERVICAL DECOMPRESSION/DISCECTOMY FUSION 3 LEVELS;  Surgeon: Hosie Spangle, MD;  Location: [facility];  Service: Neurosurgery;  Laterality: N/A;  Cervical Four-Five Cervical Five-Six Cervical Six-Seven Anterior Cervical Decompression with Fusion plating and bonegraft  . Carpal tunnel release Right ~ 1991  . Lumbar laminectomy Left 09/07/2013    L5-S1  . Joint replacement    . Back surgery    . Lumbar laminectomy/decompression microdiscectomy Left 09/07/2013    Procedure: LAMINECTOMY L5 - S1 ON THE LEFT 1 LEVEL;  Surgeon: Melina Schools, MD;  Location: [facility];  Service: Orthopedics;  Laterality: Left;    There were no vitals filed for this visit.  Visit Diagnosis:  Dizziness and  giddiness  BPPV (benign paroxysmal positional vertigo), right  Unsteadiness          [facility] PT Assessment - 03/18/15 0001    Standardized Balance Assessment   Standardized Balance Assessment Dynamic Gait Index   Dynamic Gait Index   Level Surface Normal   Change in Gait Speed Normal   Gait with Horizontal Head Turns Mild Impairment   Gait with Vertical Head Turns Moderate Impairment   Gait and Pivot Turn Normal   Step Over Obstacle Normal   Step Around Obstacles Normal   Steps Mild Impairment   Total Score 20                     [facility] Adult PT Treatment/Exercise - 03/18/15 0001    Ambulation/Gait   Ambulation/Gait Yes   Ambulation/Gait Assistance 6: Modified independent (Device/Increase time);5: Supervision   Ambulation Distance (Feet) 250 Feet   Assistive device None   Gait Pattern Step-through pattern;Decreased trunk rotation  minimal head movement during gait   Ambulation Surface Level;Indoor         Vestibular Treatment/Exercise - 03/18/15 0001    Standing Horizontal Head Turns   Number of Reps  10   Symptom Description  narrow BOS. Provided cueing for pt to decrease speed of head movement to increase safety as well as pt tolerance to head movement. With slower head movement, symptoms increased from 1/10 to 3/10, which returned to baseline within 20 seconds.   Standing Vertical Head Turns   Number of Reps  10   Symptom Description  narrow BOS; symptoms increased to 2/10.   X1 Viewing Horizontal   Foot Position standing   Comments 30 sec; 2/10 dizziness after 1 trial. Increased distance from target from 3' to 6' due to pt-reported diplopia with endrange head movement when 3' away.   X1 Viewing Vertical   Foot Position standing   Comments 30 sec; 4/10 dizziness after exercise            Balance Exercises - 03/18/15 1300    Balance Exercises: Standing   Standing Eyes Opened Narrow base of support (BOS);Head turns;Foam/compliant surface;5  reps;Other (comment)  1 pillow; horizontal, vertical head turns x5 each   Standing Eyes Closed Foam/compliant surface;2 reps;30 secs  1 pillow             PT Short Term Goals - 01/31/15 1313    PT SHORT TERM GOAL #1   Title STG's = LTG's           PT Long Term Goals - 03/16/15 1308    PT LONG TERM GOAL #1   Title Positional vertigo testing will be negative to indicate resolved BPPV.  Modified Target date: 03/27/15   Baseline 2/14: < 5 sec R upbeating torsional nystagmus, no symptoms   Status Partially Met   PT LONG TERM GOAL #2   Title Pt will decrease DHI score from 58 to </= 40 to indicate significant decrease in pt-perceived disability due to dizziness.  Modified Target date: 03/27/15   Baseline --  Continue goal through renewed POC.   Status On-going   PT LONG TERM GOAL #3   Title Pt will reach overhead to retrieve object from high shelf then place object on low shelf 5 consecutive times without increased symptoms or LOB to progress toward pt ability to tolerate occupational tasks.  Modified Target date: 03/27/15   Status On-going   PT LONG TERM GOAL #4   Title Pt will independently negotiate 26 stairs with 2 rails to enable pt to safely access second floor of home.  Modified Target date: 03/27/15   Status On-going   PT LONG TERM GOAL #5   Title Pt will independently perform home exercises to indicate daily compliance with HEP, maximize functional gains made in PT.   Target date: 03/27/15   Status On-going   PT LONG TERM GOAL #6   Title Perform DGI and improve score to > 19/24 to indicate decreased fall risk.    Target date: 03/27/15   Baseline Met 2/24 with DGI score of 20/24.   Status Achieved               Plan - 03/18/15 1309    Clinical Impression Statement Pt appears to be better tolerating functional head turns as well as standing balance with eyes closed but continues to report disequilibrium with these activities. HEP progressed. Emphasized importance of  keeping symptoms < 5/10 during HEP, as pt required cueing for this during today's session.  DGI score was 20/24, suggesting pt not at significant risk of falling at this time. Recommending pt continue to hold habituation HEP until pt speaks with MD about orthostatic hypotension (has appt on 2/27).   Pt will benefit from skilled therapeutic intervention in order to improve on the following deficits Dizziness;Decreased activity tolerance;Decreased balance   Rehab Potential Good   PT Frequency 2x / week   PT Duration 4 weeks   PT Treatment/Interventions ADLs/Self Care Home Management;Gait training;Stair training;Functional mobility training;Therapeutic activities;Therapeutic exercise;Balance training;Patient/family  education;Neuromuscular re-education;Canalith Repostioning;Vestibular;Manual techniques   PT Next Visit Plan Read note from appt with Dr. Tomi Likens. Reassess orthostatic hypotension. If no significant change in BP, add Brandt-Daroff to HEP.   Consulted and Agree with Plan of Care Patient        Problem List Patient Active Problem List   Diagnosis Date Noted  . Back pain 09/07/2013  . Hypertension   . BPH (benign prostatic hyperplasia)   . Leukocytosis, unspecified   . DYSLIPIDEMIA 03/27/2009  . HYPERTENSION, UNSPECIFIED 03/27/2009  . CORONARY ARTERY DISEASE 03/27/2009  . DEPRESSION, HX OF 03/27/2009  . TOBACCO ABUSE, HX OF 03/27/2009  . ACUTE IDIOPATHIC PERICARDITIS 03/18/2009    Billie Ruddy, PT, DPT [facility] [address] [place], [place], [address] Phone: [phone]   Fax:  [phone] 03/18/2015, 1:17 PM  Name: Sean Green MRN: [id number] Date of Birth: 23-Mar-1955

## 2015-03-19 ENCOUNTER — Other Ambulatory Visit: Payer: 59

## 2015-03-19 ENCOUNTER — Encounter: Payer: Self-pay | Admitting: Neurology

## 2015-03-19 ENCOUNTER — Other Ambulatory Visit (INDEPENDENT_AMBULATORY_CARE_PROVIDER_SITE_OTHER): Payer: 59

## 2015-03-19 ENCOUNTER — Ambulatory Visit (INDEPENDENT_AMBULATORY_CARE_PROVIDER_SITE_OTHER): Payer: 59 | Admitting: Neurology

## 2015-03-19 VITALS — BP 140/80 | HR 86 | Ht 74.0 in | Wt 304.2 lb

## 2015-03-19 DIAGNOSIS — G609 Hereditary and idiopathic neuropathy, unspecified: Secondary | ICD-10-CM

## 2015-03-19 DIAGNOSIS — I951 Orthostatic hypotension: Secondary | ICD-10-CM

## 2015-03-19 DIAGNOSIS — R42 Dizziness and giddiness: Secondary | ICD-10-CM | POA: Diagnosis not present

## 2015-03-19 DIAGNOSIS — R51 Headache: Secondary | ICD-10-CM | POA: Diagnosis not present

## 2015-03-19 DIAGNOSIS — R519 Headache, unspecified: Secondary | ICD-10-CM

## 2015-03-19 LAB — BASIC METABOLIC PANEL
BUN: 19 mg/dL (ref 6–23)
CHLORIDE: 103 meq/L (ref 96–112)
CO2: 29 mEq/L (ref 19–32)
Calcium: 9.6 mg/dL (ref 8.4–10.5)
Creatinine, Ser: 0.79 mg/dL (ref 0.40–1.50)
GFR: 106.76 mL/min (ref >60.00)
Glucose, Bld: 86 mg/dL (ref 70–99)
Potassium: 3.5 mEq/L (ref 3.5–5.1)
Sodium: 142 mEq/L (ref 135–145)

## 2015-03-19 LAB — SEDIMENTATION RATE: Sed Rate: 27 mm/hr — ABNORMAL HIGH (ref 0–22)

## 2015-03-19 LAB — VITAMIN B12: Vitamin B-12: 416 pg/mL (ref 211–911)

## 2015-03-19 LAB — TSH: TSH: 0.92 u[IU]/mL (ref 0.35–4.50)

## 2015-03-19 NOTE — Progress Notes (Signed)
NEUROLOGY CONSULTATION NOTE  LOC WROBLESKI MRN: [id number] DOB: [birth year]  Referring provider: Dr. Stephanie Acre Primary care provider: Dr. Stephanie Acre  Reason for consult:  Vertigo, dizziness  HISTORY OF PRESENT ILLNESS: Sean Green is a 60 year old right-handed male with BPH, HTN, and anxiety who presents for vertigo.  History obtained by patient, PCP note and physical therapist notes.  He began experiencing dizziness in December.  He describes it as a spinning sensation.  It is exacerbated with movement, bending over or turning his head to the right.  When he would get up in the morning, he would stagger while walking to the bathroom.  He went for vestibular rehab.  Dix-Hallpike was positive on the right.  The vertigo improved but he continued to experience this lightheadedness and fullness in the head.  Due to lightheadedness, orthostatics were checked, which were positive with a greater than 20 mmHG change on systolic and greater than 10 mmHG change on diastolic.  He denies headache, diplopia, or nausea.  He denies any preceding event, such as head injury.  He had a similar sensation of vertigo in 2013, associated with left arm numbness.  He was found to have cervical radiculopathy and underwent surgery.  PAST MEDICAL HISTORY: Past Medical History  Diagnosis Date  . BPH (benign prostatic hyperplasia)   . Hypogonadism male   . Pericarditis 2011  . Leukocytosis, unspecified   . Complication of anesthesia     woke up during hip surgery  . Hypertension     takes medication daily  . Dizziness   . Neuromuscular disorder ([place])     numbess/tingling in left hand intermittently  . Nocturia associated with benign prostatic hypertrophy   . PONV (postoperative nausea and vomiting)   . Anxiety   . Pneumonia X 1  . Myocardial infarction [facility]) 2011    vs pericarditis; "in hospital for 5 days"  . Arthritis     "joints" (09/08/2013)    PAST SURGICAL HISTORY: Past Surgical History  Procedure  Laterality Date  . Cardiac catheterization  02/2009  . Total hip arthroplasty Right 2008  . Shoulder arthroscopy w/ rotator cuff repair Right 2002  . Anterior cervical decomp/discectomy fusion  06/25/2011    Procedure: ANTERIOR CERVICAL DECOMPRESSION/DISCECTOMY FUSION 3 LEVELS;  Surgeon: Hosie Spangle, MD;  Location: [place] [facility];  Service: Neurosurgery;  Laterality: N/A;  Cervical Four-Five Cervical Five-Six Cervical Six-Seven Anterior Cervical Decompression with Fusion plating and bonegraft  . Carpal tunnel release Right ~ 1991  . Lumbar laminectomy Left 09/07/2013    L5-S1  . Joint replacement    . Back surgery    . Lumbar laminectomy/decompression microdiscectomy Left 09/07/2013    Procedure: LAMINECTOMY L5 - S1 ON THE LEFT 1 LEVEL;  Surgeon: Melina Schools, MD;  Location: [place];  Service: Orthopedics;  Laterality: Left;    MEDICATIONS: Current Outpatient Prescriptions on File Prior to Visit  Medication Sig Dispense Refill  . amLODipine (NORVASC) 10 MG tablet Take 10 mg by mouth daily.    . citalopram (CELEXA) 40 MG tablet Take 40 mg by mouth daily.      . diazepam (VALIUM) 5 MG tablet Take 5 mg by mouth every 6 (six) hours as needed for muscle spasms.    Marland Kitchen docusate sodium (COLACE) 100 MG capsule Take 1 capsule (100 mg total) by mouth 2 (two) times daily. 50 capsule 0  . lisinopril-hydrochlorothiazide (PRINZIDE,ZESTORETIC) 20-25 MG per tablet Take 1 tablet by mouth daily.     . meclizine (  ANTIVERT) 25 MG tablet Take 25 mg by mouth 3 (three) times daily as needed for dizziness. Reported on 02/27/2015    . Nutritional Supplements (MELATONIN PO) Take 1 tablet by mouth at bedtime as needed (for sleep). Reported on 02/27/2015    . Olopatadine HCl (PATADAY) 0.2 % SOLN Place 1 drop into both eyes daily as needed (for allergies).     . ondansetron (ZOFRAN ODT) 4 MG disintegrating tablet Take 1 tablet (4 mg total) by mouth every 8 (eight) hours as needed. 40 tablet 0  . oxyCODONE-acetaminophen  (PERCOCET) 10-325 MG per tablet Take 1 tablet by mouth every 6 (six) hours as needed. 90 tablet 0  . polyethylene glycol (MIRALAX / GLYCOLAX) packet Take 17 g by mouth daily. 30 each 0  . vitamin B-12 (CYANOCOBALAMIN) 1000 MCG tablet Take 1,000 mcg by mouth daily. Reported on 02/27/2015     No current facility-administered medications on file prior to visit.    ALLERGIES: Allergies  Allergen Reactions  . Other Other (See Comments)    HUSHPUPPIES-sneezing  . Starch Other (See Comments)    BAKED POTATO-sneezing  . Adhesive [Tape] Rash    FAMILY HISTORY: Family History  Problem Relation Age of Onset  . Anesthesia problems Neg Hx   . Hypotension Neg Hx   . Malignant hyperthermia Neg Hx   . Pseudochol deficiency Neg Hx     SOCIAL HISTORY: Social History   Social History  . Marital Status: Single    Spouse Name: N/A  . Number of Children: N/A  . Years of Education: N/A   Occupational History  . Not on file.   Social History Main Topics  . Smoking status: Former Smoker -- 0.50 packs/day for 10 years    Types: Cigarettes  . Smokeless tobacco: Former Systems developer     Comment: "quit smoking in my 25's; quit chewing in my 48's"  . Alcohol Use: 0.0 oz/week    0 Standard drinks or equivalent per week     Comment: 09/08/2013 "might have 3 drinks/ year, if that"  . Drug Use: No  . Sexual Activity: Not Currently   Other Topics Concern  . Not on file   Social History Narrative   Lives alone in a two story home.  Has 2 grown daughters.  Worked as a Brewing technologist.  Currently not working.  Education: one year of college.    REVIEW OF SYSTEMS: Constitutional: No fevers, chills, or sweats, no generalized fatigue, change in appetite Eyes: No visual changes, double vision, eye pain Ear, nose and throat: No hearing loss, ear pain, nasal congestion, sore throat Cardiovascular: No chest pain, palpitations Respiratory:  No shortness of breath at rest or with exertion,  wheezes GastrointestinaI: No nausea, vomiting, diarrhea, abdominal pain, fecal incontinence Genitourinary:  No dysuria, urinary retention or frequency Musculoskeletal:  No neck pain, back pain Integumentary: No rash, pruritus, skin lesions Neurological: as above Psychiatric: No depression, insomnia, anxiety Endocrine: No palpitations, fatigue, diaphoresis, mood swings, change in appetite, change in weight, increased thirst Hematologic/Lymphatic:  No anemia, purpura, petechiae. Allergic/Immunologic: no itchy/runny eyes, nasal congestion, recent allergic reactions, rashes  PHYSICAL EXAM: Filed Vitals:   03/19/15 1233  BP: 140/80  Pulse: 86    Blood Pressure Pulse Supine: 140/90   86 bpm Sitting: 130/88   92 bpm Standing: 110/80   93 bpm General: No acute distress.  Patient appears well-groomed.  Head:  Normocephalic/atraumatic Eyes:  fundi unremarkable, without vessel changes, exudates, hemorrhages or papilledema. Neck: supple, no paraspinal  tenderness, full range of motion Back: No paraspinal tenderness Heart: regular rate and rhythm Lungs: Clear to auscultation bilaterally. Vascular: No carotid bruits. Neurological Exam: Mental status: alert and oriented to person, place, and time, recent and remote memory intact, fund of knowledge intact, attention and concentration intact, speech fluent and not dysarthric, language intact. Cranial nerves: CN I: not tested CN II: pupils equal, round and reactive to light, visual fields intact, fundi unremarkable, without vessel changes, exudates, hemorrhages or papilledema. CN III, IV, VI:  full range of motion, no nystagmus, no ptosis CN V: facial sensation intact CN VII: upper and lower face symmetric CN VIII: hearing intact CN IX, X: gag intact, uvula midline CN XI: sternocleidomastoid and trapezius muscles intact CN XII: tongue midline Bulk & Tone: normal, no fasciculations. Motor:  5/5 throughout  Sensation:  Pinprick sensation intact.   Decreased vibration sensation in toes, left more than right foot. Deep Tendon Reflexes:  2+ throughout, toes downgoing.  Finger to nose testing:  Without dysmetria.  Heel to shin:  Without dysmetria.  Gait:  Cautious, otherwise ataxic.  Able to turn, unable to tandem walk. Romberg with sway.  IMPRESSION: Fullness in head.  Possibly secondary to orthostatic hypotension Positional vertigo, likely BPPV Peripheral neuropathy Orthostatic hypotension.  With symptoms of neuropathy, consider autonomic neuropathy   PLAN: 1.  Given the head fullness, dizziness and vertigo, should rule out secondary causes.  Will get MRI of brain and IACs with and without contrast 2.  To look for causes of nerve problems, will check ANA, Sed Rate, B6, B12, TSH, SPEP/IFE and BMP 3.  Further recommendations pending results.  Will likely order NCV-EMG 4.  Follow up in 3-4 months.  Thank you for allowing me to take part in the care of this patient.  Metta Clines, DO  CC:  Jonathon Jordan, MD

## 2015-03-19 NOTE — Progress Notes (Signed)
Chart forwarded.  

## 2015-03-19 NOTE — Patient Instructions (Signed)
1.  Will get MRI of brain and internal auditory canals with and without contrast to look for cause of dizziness 2.  To look for causes of nerve problems, will check ANA, Sed Rate, B6, B12, TSH, SPEP/IFE and BMP 3.  Further recommendations pending results.  Will likely order you a nerve test.

## 2015-03-20 LAB — ANA: Anti Nuclear Antibody(ANA): NEGATIVE

## 2015-03-21 ENCOUNTER — Ambulatory Visit: Payer: 59 | Admitting: Physical Therapy

## 2015-03-21 ENCOUNTER — Telehealth: Payer: Self-pay

## 2015-03-21 LAB — IMMUNOFIXATION ELECTROPHORESIS
IGG (IMMUNOGLOBIN G), SERUM: 1080 mg/dL (ref 650–1600)
IgA: 194 mg/dL (ref 68–379)
IgM, Serum: 62 mg/dL (ref 41–251)

## 2015-03-21 LAB — PROTEIN ELECTROPHORESIS, SERUM
Albumin ELP: 4 g/dL (ref 3.8–4.8)
Alpha-1-Globulin: 0.3 g/dL (ref 0.2–0.3)
Alpha-2-Globulin: 0.7 g/dL (ref 0.5–0.9)
BETA 2: 0.4 g/dL (ref 0.2–0.5)
Beta Globulin: 0.4 g/dL (ref 0.4–0.6)
GAMMA GLOBULIN: 1 g/dL (ref 0.8–1.7)
TOTAL PROTEIN, SERUM ELECTROPHOR: 6.8 g/dL (ref 6.1–8.1)

## 2015-03-21 NOTE — Telephone Encounter (Signed)
Called and spoke w/ patient. Scheduled 3 month f/u for June. Pt's PCP has written him out of work until 03/28/15 due to dizziness. Pt's MRI is scheduled for the 8th. Pt worried about having to return to work so quickly. Advised pt to contact PCP for another work note. States he tried and she told him to refer to [place] for returning to work. 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 Please advise.

## 2015-03-21 NOTE — Telephone Encounter (Signed)
It should be taken care of by his PCP

## 2015-03-21 NOTE — Telephone Encounter (Signed)
Sean Green would like to know if he is to make an appt for f/u after his MRI on 3/8. He has a work note to cover him through 3/8, but not beyond that date. Please call to advise. [phone]

## 2015-03-21 NOTE — Telephone Encounter (Signed)
Left message on machine for pt to return call to the office.  

## 2015-03-23 ENCOUNTER — Encounter: Payer: 59 | Admitting: Physical Therapy

## 2015-03-24 LAB — VITAMIN B6: Vitamin B6: 25.5 ng/mL — ABNORMAL HIGH (ref 2.1–21.7)

## 2015-03-26 ENCOUNTER — Encounter: Payer: 59 | Admitting: Physical Therapy

## 2015-03-28 ENCOUNTER — Ambulatory Visit
Admission: RE | Admit: 2015-03-28 | Discharge: 2015-03-28 | Disposition: A | Discharge status: Home / Self Care | Payer: 59 | Source: Ambulatory Visit | Attending: Neurology | Admitting: Neurology

## 2015-03-28 ENCOUNTER — Telehealth: Payer: Self-pay

## 2015-03-28 DIAGNOSIS — I951 Orthostatic hypotension: Secondary | ICD-10-CM

## 2015-03-28 DIAGNOSIS — R51 Headache: Secondary | ICD-10-CM

## 2015-03-28 DIAGNOSIS — R519 Headache, unspecified: Secondary | ICD-10-CM

## 2015-03-28 DIAGNOSIS — G609 Hereditary and idiopathic neuropathy, unspecified: Secondary | ICD-10-CM

## 2015-03-28 DIAGNOSIS — R42 Dizziness and giddiness: Secondary | ICD-10-CM

## 2015-03-28 MED ORDER — DIAZEPAM 5 MG PO TABS: 5.0000 mg | ORAL_TABLET | Freq: Once | ORAL | Status: DC

## 2015-03-28 NOTE — Telephone Encounter (Signed)
I don't prescribe anything stronger than that.  Can we send him to open MRI.  Okay for note

## 2015-03-28 NOTE — Telephone Encounter (Signed)
Message relayed to patient. Verbalized understanding and denied questions. Letter at front desk for pickup. Message left to call pt at [facility]. Order's for MRI faxed. New RX for Valium printed.

## 2015-03-28 NOTE — Telephone Encounter (Signed)
Pt called you back please call back

## 2015-03-28 NOTE — Telephone Encounter (Signed)
Message relayed to patient. Verbalized understanding. Pt states he was supposed to have MRI done today. Took the 5 mg of Valium that had been prescribed. Broke out in a cold sweat during scan and it was stopped by tech. States he is going to need something stronger to make it through scan. Pt also wondering if you would write him a note for work until atleast after scan (not rescheduled yet), pt states he is still having dizzy spells and is worried about returning to work. Please advise.    Voicemail is okay!

## 2015-03-28 NOTE — Telephone Encounter (Signed)
Left message on machine for pt to return call to the office.  

## 2015-03-28 NOTE — Telephone Encounter (Signed)
-----   Message from Pieter Partridge, DO sent at 03/25/2015  8:51 PM EST ----- Lab results do not show a cause for nerve problems in the feet. Sed rate and B6 level are borderline high but not clinically remarkable.  As discussed, I would proceed with nerve conduction study/EMG of lower extremities

## 2015-03-28 NOTE — Telephone Encounter (Signed)
Please see below.

## 2015-03-29 ENCOUNTER — Encounter: Payer: 59 | Admitting: Physical Therapy

## 2015-04-02 ENCOUNTER — Ambulatory Visit: Payer: 59 | Admitting: Neurology

## 2015-04-13 ENCOUNTER — Encounter: Payer: Self-pay | Admitting: Physical Therapy

## 2015-04-13 NOTE — Therapy (Signed)
[facility] [address] [place], [place], [address] Phone: [phone]   Fax:  [phone]  Patient Details  Name: Sean Green MRN: [id number] Date of Birth: Nov 15, 1955 Referring Provider:  Jonathon Jordan, MD  Encounter Date: 04/13/2015  PHYSICAL THERAPY DISCHARGE SUMMARY  Visits from Start of Care: 7  Current functional level related to goals / functional outcomes: Unknown, as patient did not return to PT after initial 7 sessions.     PT Long Term Goals - 03/16/15 1308    PT LONG TERM GOAL #1   Title Positional vertigo testing will be negative to indicate resolved BPPV.  Modified Target date: 03/27/15   Baseline 2/14: < 5 sec R upbeating torsional nystagmus, no symptoms   Status Partially Met   PT LONG TERM GOAL #2   Title Pt will decrease DHI score from 58 to </= 40 to indicate significant decrease in pt-perceived disability due to dizziness.  Modified Target date: 03/27/15   Baseline --  Continue goal through renewed POC.   Status On-going   PT LONG TERM GOAL #3   Title Pt will reach overhead to retrieve object from high shelf then place object on low shelf 5 consecutive times without increased symptoms or LOB to progress toward pt ability to tolerate occupational tasks.  Modified Target date: 03/27/15   Status On-going   PT LONG TERM GOAL #4   Title Pt will independently negotiate 26 stairs with 2 rails to enable pt to safely access second floor of home.  Modified Target date: 03/27/15   Status On-going   PT LONG TERM GOAL #5   Title Pt will independently perform home exercises to indicate daily compliance with HEP, maximize functional gains made in PT.   Target date: 03/27/15   Status On-going   PT LONG TERM GOAL #6   Title Perform DGI and improve score to > 19/24 to indicate decreased fall risk.    Target date: 03/27/15   Baseline Met 2/24 with DGI score of 20/24.   Status Achieved       Remaining  deficits: Unknown, as patient did not return to PT after initial 7 sessions.    Education / Equipment: HEP.   Educated patient that dizziness and balance issues appeared to be multifactorial. This PT was unable to safely address BPPV and motion sensitivity (via habituation exercises) until orthostatic hypotension first addressed. Nonetheless, patient requested to be discharged from vestibular PT due to pt perception that remaining dizziness and balance issues were secondary to orthostatic hypotension  Plan: Patient agrees to discharge.  Patient goals were not met. Patient is being discharged due to the patient's request.  ?????                  Billie Ruddy, PT, DPT [facility] [address] [place], [place], [address] Phone: [phone]   Fax:  [phone] 04/13/2015, 10:46 AM

## 2015-04-17 ENCOUNTER — Ambulatory Visit (INDEPENDENT_AMBULATORY_CARE_PROVIDER_SITE_OTHER): Payer: 59 | Admitting: Neurology

## 2015-04-17 DIAGNOSIS — G609 Hereditary and idiopathic neuropathy, unspecified: Secondary | ICD-10-CM | POA: Diagnosis not present

## 2015-04-17 NOTE — Procedures (Signed)
[facility]  [place], [place]  [place], [place] [address] Tel: [phone] Fax:  [phone] Test Date:  04/17/2015  Patient: Sean Green DOB: Aug 27, 1955 Physician: Narda Amber, DO  Sex: Male Height: 6\' 2"  Ref Phys: Metta Clines, M.D.  ID#: [id number] Temp: 33.2C Technician: Jerilynn Mages. Dean   Patient Complaints: This is a 60 year old gentleman referred for evaluation of left leg numbness.  NCV & EMG Findings: Extensive electrodiagnostic testing of the left lower extremity and additional studies of the right shows: 1. Bilateral sural and superficial peroneal sensory responses are within normal limits. 2. Bilateral tibial and peroneal motor responses are within normal limits. 3. Bilateral tibial H reflex studies are within normal limits. 4. There is no evidence of active or chronic motor axon loss changes affecting any of the tested muscles. Motor unit configuration and recruitment pattern is within normal limits.  Impression: This is a normal study of the lower extremities. In particular, there is no evidence of a generalized sensorimotor polyneuropathy or left lumbosacral radiculopathy.   ___________________________ Narda Amber, DO    Nerve Conduction Studies Anti Sensory Summary Table   Stim Site NR Peak (ms) Norm Peak (ms) P-T Amp (V) Norm P-T Amp  Left Sup Peroneal Anti Sensory (Ant Lat Mall)  12 cm    2.9 <4.6 5.9 >4  Right Sup Peroneal Anti Sensory (Ant Lat Mall)  12 cm    2.5 <4.6 6.4 >4  Left Sural Anti Sensory (Lat Mall)  Calf    4.3 <4.6 7.8 >4  Right Sural Anti Sensory (Lat Mall)  Calf    4.0 <4.6 7.3 >4   Motor Summary Table   Stim Site NR Onset (ms) Norm Onset (ms) O-P Amp (mV) Norm O-P Amp Site1 Site2 Delta-0 (ms) Dist (cm) Vel (m/s) Norm Vel (m/s)  Left Peroneal Motor (Ext Dig Brev)  Ankle    3.8 <6.0 2.6 >2.5 B Fib Ankle 8.2 37.0 45 >40  B Fib    12.0  2.1  Poplt B Fib 2.3 10.0 43 >40  Poplt    14.3  2.1         Right Peroneal Motor (Ext  Dig Brev)  Ankle    3.4 <6.0 2.5 >2.5 B Fib Ankle 8.4 35.0 42 >40  B Fib    11.8  1.7  Poplt B Fib 2.3 10.0 43 >40  Poplt    14.1  1.7         Left Tibial Motor (Abd Hall Brev)  Ankle    3.6 <6.0 8.9 >4 Knee Ankle 10.2 45.0 44 >40  Knee    13.8  6.1         Right Tibial Motor (Abd Hall Brev)  Ankle    4.2 <6.0 7.8 >4 Knee Ankle 10.0 47.0 47 >40  Knee    14.2  4.1          H Reflex Studies   NR H-Lat (ms) Lat Norm (ms) L-R H-Lat (ms) M-Lat (ms) HLat-MLat (ms)  Left Tibial (Gastroc)     34.83 <35 0.54 7.21 27.62  Right Tibial (Gastroc)     34.29 <35 0.54 9.12 25.17   EMG   Side Muscle Ins Act Fibs Psw Fasc Number Recrt Dur Dur. Amp Amp. Poly Poly. Comment  Left AntTibialis Nml Nml Nml Nml Nml Nml Nml Nml Nml Nml Nml Nml N/A  Left Gastroc Nml Nml Nml Nml Nml Nml Nml Nml Nml Nml Nml Nml N/A  Left Flex Dig Long  Nml Nml Nml Nml Nml Nml Nml Nml Nml Nml Nml Nml N/A  Left VastusLat Nml Nml Nml Nml Nml Nml Nml Nml Nml Nml Nml Nml N/A  Left BicepsFemS Nml Nml Nml Nml Nml Nml Nml Nml Nml Nml Nml Nml N/A  Left GluteusMed Nml Nml Nml Nml Nml Nml Nml Nml Nml Nml Nml Nml N/A      Waveforms:

## 2015-04-18 ENCOUNTER — Telehealth: Payer: Self-pay

## 2015-04-18 NOTE — Telephone Encounter (Signed)
-----   Message from Pieter Partridge, DO sent at 04/18/2015  7:11 AM EDT ----- EMG shows no evidence of nerve damage.  At this point, we will see what the MRI of brain shows.

## 2015-04-18 NOTE — Telephone Encounter (Signed)
Message relayed. Pt verbalized understanding, denied questions.

## 2015-04-23 ENCOUNTER — Other Ambulatory Visit: Payer: Self-pay | Admitting: Neurology

## 2015-04-23 ENCOUNTER — Ambulatory Visit
Admission: RE | Admit: 2015-04-23 | Discharge: 2015-04-23 | Disposition: A | Discharge status: Home / Self Care | Payer: 59 | Source: Ambulatory Visit | Attending: Neurology | Admitting: Neurology

## 2015-04-23 MED ORDER — GADOBENATE DIMEGLUMINE 529 MG/ML IV SOLN
20.0000 mL | Freq: Once | INTRAVENOUS | Status: AC | PRN
  Administered 2015-04-23: 20 mL via INTRAVENOUS

## 2015-04-24 ENCOUNTER — Telehealth: Payer: Self-pay

## 2015-04-24 NOTE — Telephone Encounter (Signed)
-----   Message from Pieter Partridge, DO sent at 04/24/2015  6:58 AM EDT ----- MRI of brain shows a tiny spot on the brain related to his other medical conditions (history of high blood pressure, high cholesterol, history of smoking).  It is an incidental finding and I don't think is the cause of the dizziness or numbness and tingling.  I would recommend starting aspirin 81mg  daily

## 2015-04-24 NOTE — Telephone Encounter (Signed)
I think the lightheadedness and "fullness in the head" is related to drop in blood pressure.  Once that is controlled and he still notes head pressure, then I would have him follow up with me.

## 2015-04-24 NOTE — Telephone Encounter (Signed)
Left message on machine for pt to return call to the office.  

## 2015-04-24 NOTE — Telephone Encounter (Signed)
Message relayed to patient. Verbalized understanding and denied questions.   

## 2015-04-24 NOTE — Telephone Encounter (Signed)
Pt wants to know if he needs to follow up or not? Already taking baby aspiring, fyi.

## 2015-04-30 ENCOUNTER — Telehealth: Payer: Self-pay | Admitting: Neurology

## 2015-04-30 NOTE — Telephone Encounter (Signed)
Fax number for the note is [phone]. Thanks

## 2015-04-30 NOTE — Telephone Encounter (Signed)
Letter written and faxed.

## 2015-04-30 NOTE — Telephone Encounter (Signed)
Sean Green [birth year] he went back to work but he said they need a note saying that he is able to return back to work. His call back number is [phone]. Thank you

## 2015-07-16 ENCOUNTER — Ambulatory Visit: Payer: 59 | Admitting: Neurology

## 2015-08-07 ENCOUNTER — Encounter: Payer: Self-pay | Admitting: Neurology

## 2015-08-07 ENCOUNTER — Ambulatory Visit (INDEPENDENT_AMBULATORY_CARE_PROVIDER_SITE_OTHER): Payer: 59 | Admitting: Neurology

## 2015-08-07 VITALS — BP 164/98 | HR 99 | Ht 74.0 in | Wt 292.0 lb

## 2015-08-07 DIAGNOSIS — H8110 Benign paroxysmal vertigo, unspecified ear: Secondary | ICD-10-CM

## 2015-08-07 DIAGNOSIS — I10 Essential (primary) hypertension: Secondary | ICD-10-CM | POA: Diagnosis not present

## 2015-08-07 NOTE — Progress Notes (Signed)
Chart forwarded.  

## 2015-08-07 NOTE — Progress Notes (Addendum)
NEUROLOGY FOLLOW UP OFFICE NOTE  THELTON MOFFITT [id number]  HISTORY OF PRESENT ILLNESS: Sean Green is a 60 year old right-handed male with BPH, HTN, and anxiety who follows up for BPPV, orthostatic hypotension and peripheral neuropathy.  UPDATE: Last visit, he was found to be orthostatic.  He also exhibited signs of neuropathy and unsteady gait.  He underwent neuropathy workup.  ANA negative, Sed Rate was 27, B6 25.5, B12 416, TSH 0.92, and unremarkable SPEP and IFE.  He underwent NCV-EMG on 04/17/15, which was normal.  MRI of brain and IAC with and without contrast from 04/23/15 was personally reviewed and was unremarkable except for mild chronic small vessel disease and incidental punctate area of chronic ischemia in left pons.  He was started on ASA 81mg  daily.  He retired 2 weeks ago and feels the dizziness has improved.  He takes his time changing position.  He denies numbness.  HISTORY: He began experiencing dizziness in December.  He describes it as a spinning sensation. It is exacerbated with movement, bending over or turning his head to the right.  When he would get up in the morning, he would stagger while walking to the bathroom.  He went for vestibular rehab.  Dix-Hallpike was positive on the right.  The vertigo improved but he continued to experience this lightheadedness and fullness in the head.  Due to lightheadedness, orthostatics were checked, which were positive with a greater than 20 mmHG change on systolic and greater than 10 mmHG change on diastolic.  He denies headache, diplopia, or nausea.  He denies any preceding event, such as head injury.  He had a similar sensation of vertigo in 2013, associated with left arm numbness.  He was found to have cervical radiculopathy and underwent surgery.  PAST MEDICAL HISTORY: Past Medical History  Diagnosis Date  . BPH (benign prostatic hyperplasia)   . Hypogonadism male   . Pericarditis 2011  . Leukocytosis, unspecified   .  Complication of anesthesia     woke up during hip surgery  . Hypertension     takes medication daily  . Dizziness   . Neuromuscular disorder ([place])     numbess/tingling in left hand intermittently  . Nocturia associated with benign prostatic hypertrophy   . PONV (postoperative nausea and vomiting)   . Anxiety   . Pneumonia X 1  . Myocardial infarction [facility]) 2011    vs pericarditis; "in hospital for 5 days"  . Arthritis     "joints" (09/08/2013)    MEDICATIONS: Current Outpatient Prescriptions on File Prior to Visit  Medication Sig Dispense Refill  . citalopram (CELEXA) 40 MG tablet Take 40 mg by mouth daily.      Marland Kitchen lisinopril-hydrochlorothiazide (PRINZIDE,ZESTORETIC) 20-25 MG per tablet Take 1 tablet by mouth daily.     . Nutritional Supplements (MELATONIN PO) Take 1 tablet by mouth at bedtime as needed (for sleep). Reported on 02/27/2015    . Olopatadine HCl (PATADAY) 0.2 % SOLN Place 1 drop into both eyes daily as needed (for allergies).      No current facility-administered medications on file prior to visit.    ALLERGIES: Allergies  Allergen Reactions  . Other Other (See Comments)    HUSHPUPPIES-sneezing  . Starch Other (See Comments)    BAKED POTATO-sneezing  . Adhesive [Tape] Rash    FAMILY HISTORY: Family History  Problem Relation Age of Onset  . Anesthesia problems Neg Hx   . Hypotension Neg Hx   . Malignant hyperthermia Neg  Hx   . Pseudochol deficiency Neg Hx     SOCIAL HISTORY: Social History   Social History  . Marital Status: Single    Spouse Name: N/A  . Number of Children: N/A  . Years of Education: N/A   Occupational History  . Not on file.   Social History Main Topics  . Smoking status: Former Smoker -- 0.50 packs/day for 10 years    Types: Cigarettes  . Smokeless tobacco: Former Systems developer     Comment: "quit smoking in my 40's; quit chewing in my 70's"  . Alcohol Use: 0.0 oz/week    0 Standard drinks or equivalent per week     Comment:  09/08/2013 "might have 3 drinks/ year, if that"  . Drug Use: No  . Sexual Activity: Not Currently   Other Topics Concern  . Not on file   Social History Narrative   Lives alone in a two story home.  Has 2 grown daughters.  Worked as a Brewing technologist.  Currently not working.  Education: one year of college.    REVIEW OF SYSTEMS: Constitutional: No fevers, chills, or sweats, no generalized fatigue, change in appetite Eyes: No visual changes, double vision, eye pain Ear, nose and throat: No hearing loss, ear pain, nasal congestion, sore throat Cardiovascular: No chest pain, palpitations Respiratory:  No shortness of breath at rest or with exertion, wheezes GastrointestinaI: No nausea, vomiting, diarrhea, abdominal pain, fecal incontinence Genitourinary:  No dysuria, urinary retention or frequency Musculoskeletal:  No neck pain, back pain Integumentary: No rash, pruritus, skin lesions Neurological: as above Psychiatric: No depression, insomnia, anxiety Endocrine: No palpitations, fatigue, diaphoresis, mood swings, change in appetite, change in weight, increased thirst Hematologic/Lymphatic:  No purpura, petechiae. Allergic/Immunologic: no itchy/runny eyes, nasal congestion, recent allergic reactions, rashes  PHYSICAL EXAM: Filed Vitals:   08/07/15 1115  BP: 164/98  Pulse: 99   General: No acute distress.  Patient appears well-groomed.   Head:  Normocephalic/atraumatic Eyes:  Fundi examined but not visualized Neck: supple, no paraspinal tenderness, full range of motion Heart:  Regular rate and rhythm Lungs:  Clear to auscultation bilaterally Back: No paraspinal tenderness Neurological Exam: alert and oriented to person, place, and time. Attention span and concentration intact, recent and remote memory intact, fund of knowledge intact.  Speech fluent and not dysarthric, language intact.  CN II-XII intact. Bulk and tone normal, muscle strength 5/5 throughout.  Sensation to light  touch, temperature and vibration intact.  Deep tendon reflexes 2+ throughout, toes downgoing.  Finger to nose testing intact.  Gait normal.  IMPRESSION: BPPV HTN  Question of underlying small fiber neuropathy  PLAN: He will follow up as needed He should follow up with his PCP regarding uncontrolled HTN  15 minutes spent face to face with patient, over 50% spent discussing management.  Metta Clines, DO  CC:  Jonathon Jordan, MD

## 2015-10-24 ENCOUNTER — Ambulatory Visit: Payer: 59 | Admitting: Neurology

## 2015-11-30 ENCOUNTER — Other Ambulatory Visit: Payer: Self-pay | Admitting: Urology

## 2015-12-12 ENCOUNTER — Encounter (HOSPITAL_COMMUNITY): Payer: Self-pay | Admitting: *Deleted

## 2015-12-12 ENCOUNTER — Ambulatory Visit (HOSPITAL_COMMUNITY)
Admission: RE | Admit: 2015-12-12 | Discharge: 2015-12-12 | Disposition: A | Discharge status: Home / Self Care | Payer: 59 | Source: Ambulatory Visit | Attending: Urology | Admitting: Urology

## 2015-12-12 DIAGNOSIS — N200 Calculus of kidney: Secondary | ICD-10-CM | POA: Insufficient documentation

## 2015-12-12 DIAGNOSIS — I4581 Long QT syndrome: Secondary | ICD-10-CM | POA: Diagnosis not present

## 2015-12-12 DIAGNOSIS — Z01810 Encounter for preprocedural cardiovascular examination: Secondary | ICD-10-CM | POA: Insufficient documentation

## 2015-12-12 NOTE — Progress Notes (Signed)
Writer is doing history for ESWL procedure on 12/17/15. He had pericarditis in 2011 which presented as an MI. Called [facility] to see if an EKG was necessary. EKG is required for procedure. Patient is to arrive prior to driving up to [place] for Thanksgiving.

## 2015-12-17 ENCOUNTER — Ambulatory Visit (HOSPITAL_COMMUNITY)
Admission: RE | Admit: 2015-12-17 | Discharge: 2015-12-17 | Disposition: A | Discharge status: Home / Self Care | Payer: 59 | Source: Ambulatory Visit | Attending: Urology | Admitting: Urology

## 2015-12-17 ENCOUNTER — Encounter (HOSPITAL_COMMUNITY)
Admission: RE | Disposition: A | Discharge status: Home / Self Care | Payer: Self-pay | Source: Ambulatory Visit | Attending: Urology

## 2015-12-17 ENCOUNTER — Ambulatory Visit (HOSPITAL_COMMUNITY): Payer: 59

## 2015-12-17 ENCOUNTER — Encounter (HOSPITAL_COMMUNITY): Payer: Self-pay | Admitting: General Practice

## 2015-12-17 DIAGNOSIS — F329 Major depressive disorder, single episode, unspecified: Secondary | ICD-10-CM | POA: Insufficient documentation

## 2015-12-17 DIAGNOSIS — N201 Calculus of ureter: Secondary | ICD-10-CM

## 2015-12-17 DIAGNOSIS — Z8349 Family history of other endocrine, nutritional and metabolic diseases: Secondary | ICD-10-CM | POA: Insufficient documentation

## 2015-12-17 DIAGNOSIS — Z91040 Latex allergy status: Secondary | ICD-10-CM | POA: Insufficient documentation

## 2015-12-17 DIAGNOSIS — I10 Essential (primary) hypertension: Secondary | ICD-10-CM | POA: Insufficient documentation

## 2015-12-17 DIAGNOSIS — Z91018 Allergy to other foods: Secondary | ICD-10-CM | POA: Insufficient documentation

## 2015-12-17 DIAGNOSIS — E669 Obesity, unspecified: Secondary | ICD-10-CM | POA: Insufficient documentation

## 2015-12-17 DIAGNOSIS — Z79899 Other long term (current) drug therapy: Secondary | ICD-10-CM | POA: Diagnosis not present

## 2015-12-17 DIAGNOSIS — Z96649 Presence of unspecified artificial hip joint: Secondary | ICD-10-CM | POA: Diagnosis not present

## 2015-12-17 DIAGNOSIS — Z8249 Family history of ischemic heart disease and other diseases of the circulatory system: Secondary | ICD-10-CM | POA: Insufficient documentation

## 2015-12-17 HISTORY — DX: Personal history of urinary calculi: Z87.442

## 2015-12-17 HISTORY — PX: EXTRACORPOREAL SHOCK WAVE LITHOTRIPSY: SHX1557

## 2015-12-17 HISTORY — DX: Atherosclerotic heart disease of native coronary artery without angina pectoris: I25.10

## 2015-12-17 SURGERY — LITHOTRIPSY, ESWL
Anesthesia: LOCAL | Laterality: Right

## 2015-12-17 MED ORDER — TAMSULOSIN HCL 0.4 MG PO CAPS: 0.4000 mg | ORAL_CAPSULE | ORAL | 0 refills | Status: DC

## 2015-12-17 MED ORDER — DIAZEPAM 5 MG PO TABS
10.0000 mg | ORAL_TABLET | ORAL | Status: AC
  Administered 2015-12-17: 10 mg via ORAL
  Filled 2015-12-17: qty 2

## 2015-12-17 MED ORDER — DIPHENHYDRAMINE HCL 25 MG PO CAPS
25.0000 mg | ORAL_CAPSULE | ORAL | Status: AC
  Administered 2015-12-17: 25 mg via ORAL
  Filled 2015-12-17: qty 1

## 2015-12-17 MED ORDER — CIPROFLOXACIN HCL 500 MG PO TABS
500.0000 mg | ORAL_TABLET | ORAL | Status: AC
  Administered 2015-12-17: 500 mg via ORAL
  Filled 2015-12-17: qty 1

## 2015-12-17 MED ORDER — OXYCODONE HCL 10 MG PO TABS: 10.0000 mg | ORAL_TABLET | ORAL | 0 refills | Status: DC | PRN

## 2015-12-17 MED ORDER — SODIUM CHLORIDE 0.9 % IV SOLN
INTRAVENOUS | Status: DC
  Administered 2015-12-17: 08:00:00 via INTRAVENOUS

## 2015-12-17 NOTE — H&P (Signed)
CC/HPI: I was consulted by the above provider to assess the patient's left flank pain. He describes some nausea and even some night sweats. These symptoms settled down but he feels sore and tender in the right flank. He thinks he may be going to the restroom more frequently than usual.   at baseline he voids every 2-3 hours with a poor flow. He thinks his urine symptoms started after back surgery a few years ago. He has also had cervical surgery. His bowel function is normal. The presentation has not been medically treated   4 months he can leak while he is asleep approximately one time per week   the patient has med of left flank pain and we will order a CT scan. He does have voiding dysfunction and his bedwetting is somewhat out of the ordinary. He does have risk factors for neurogenic bladder and he felt that his low back surgery possibly was related to his lower urinary tract symptoms   the patient's CT scan demonstrated a 4 mm mid right ureteral stone. His pain was middle or left. Medical therapy discussed. Indication was a long emergency room discussed. I will see in 2 weeks with KUB. He was given 40 hydrocodone, 20 Phenergan, Rapaflo samples with discussion of side effects and a strainer   In the future we will discuss bedwetting and a lessened urgency to urinate. He does have risk factors for neurogenic bladder   today  the patient has been pain-free. He's clinically noninfected   I reviewed the KUB and a my opinion he still has a stone   I drew him a picture and we talked lithotripsy   We talked about ESWL in detail. Pros, cons, general surgical and anesthetic risks, and other options including watchful waiting and ureteroscopy were discussed. Success and failure rates and need for further/repeat therapy were discussed. Risks were described but not limited to pain, infection, sepsis, and bleeding. The risk of renal and ureteral trauma with short and long term sequelae were discussed. The  risk of injury to adjacent structures was discussed. The risk of needing a stent post-ESWL was discussed.   the stone is oval and clearly seen to the right of the vertebral body approximate 1 cm and just under the L4 spinous process. An arrow was not applied     ALLERGIES: Latex - Skin Rash    MEDICATIONS: Aspirin  Alaway 0.025 % (0.035 %) drops  Citalopram Hydrobromide 40 MG Oral Tablet Oral  Hydrocodone-Acetaminophen 5 mg-325 mg tablet 1-2 tablet PO Q 6 H  Lisinopril TABS Oral  Melatonin CAPS Oral  Men 50 Plus Multivitamin  Promethazine Hcl 25 mg tablet 1 tablet PO Q 6 H     GU PSH: None     PSH Notes: Hand Surgery, Shoulder Surgery, Total Hip Replacement   NON-GU PSH: Total Hip Replacement - 2011    GU PMH: Calculus Kidney and Ureter - 11/13/2015 Enuresis, Nocturnal - 11/13/2015 Weak Urinary Stream - 11/13/2015 ED, arterial insufficiency, Erectile dysfunction due to arterial insufficiency - 2014 Other microscopic hematuria, Microscopic hematuria - 2014 Peyronies Disease, 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, Hypogonadism, testicular - 2014      PMH Notes:  heart attack     NON-GU PMH: Anxiety, Anxiety (Symptom) - 2014 Decreased libido, Decreased libido - 2014 Other fatigue, Fatigue - 2014 Personal history of other diseases of the circulatory system, History of hypertension - 2014 Personal history of other mental and behavioral disorders, History of depression - 2014  Depression    FAMILY HISTORY: Congestive Heart Failure - Father Gout - Father Heart Disease - Father, Mother Hypertension - Mother, Father Hyperthyroidism - Mother   SOCIAL HISTORY: None    Notes: Marital History - Divorced, Tobacco Use, Alcohol Use   REVIEW OF SYSTEMS:    GU Review Male:   Patient reports get up at night to urinate. Patient denies frequent urination, hard to postpone urination, burning/ pain with urination, leakage of urine, stream starts and stops, trouble starting  your stream, have to strain to urinate , erection problems, and penile pain.  Gastrointestinal (Upper):   Patient denies nausea, vomiting, and indigestion/ heartburn.  Gastrointestinal (Lower):   Patient denies diarrhea and constipation.  Constitutional:   Patient denies fever, night sweats, weight loss, and fatigue.  Skin:   Patient denies skin rash/ lesion and itching.  Eyes:   Patient denies double vision and blurred vision.  Ears/ Nose/ Throat:   Patient denies sore throat and sinus problems.  Hematologic/Lymphatic:   Patient denies swollen glands and easy bruising.  Cardiovascular:   Patient denies leg swelling and chest pains.  Respiratory:   Patient denies cough and shortness of breath.  Endocrine:   Patient denies excessive thirst.  Musculoskeletal:   Patient denies back pain and joint pain.  Neurological:   Patient denies headaches and dizziness.  Psychologic:   Patient denies depression and anxiety.   VITAL SIGNS:      11/29/2015 02:14 PM  Weight 280 lb / 127.01 kg  Height 74 in / 187.96 cm  BMI 35.9 kg/m   GU PHYSICAL EXAMINATION:    Prostate: no convincing right CVA tenderness; no epididymitis; small benign prostate   MULTI-SYSTEM PHYSICAL EXAMINATION:    Constitutional: Well-nourished. No physical deformities. Normally developed. Good grooming.  Neck: Neck symmetrical, not swollen. Normal tracheal position.  Respiratory: No labored breathing, no use of accessory muscles.   Cardiovascular: Normal temperature, normal extremity pulses, no swelling, no varicosities.  Lymphatic: No enlargement of neck, axillae, groin.  Skin: No paleness, no jaundice, no cyanosis. No lesion, no ulcer, no rash.  Neurologic / Psychiatric: Oriented to time, oriented to place, oriented to person. No depression, no anxiety, no agitation.  Gastrointestinal: No mass, no tenderness, no rigidity, non obese abdomen.  Eyes: Normal conjunctivae. Normal eyelids.  Ears, Nose, Mouth, and Throat: Left ear  no scars, no lesions, no masses. Right ear no scars, no lesions, no masses. Nose no scars, no lesions, no masses. Normal hearing. Normal lips.  Musculoskeletal: Normal gait and station of head and neck.    PAST DATA REVIEWED:  Source Of History:  Patient   03/06/09 02/22/09  Hormones  Testosterone, Total 496.0  234.0         Urinalysis Dipstick Dipstick Cont'd  Color: Yellow Bilirubin: Neg  Appearance: Clear Ketones: Neg  Specific Gravity: 1.015 Blood: Neg  pH: 6.0 Protein: Neg  Glucose: Neg Urobilinogen: 0.2    Nitrites: Neg    Leukocyte Esterase: Neg    ASSESSMENT:      ICD-10 Details  1 GU:   Calculus Kidney and Ureter - N20.2      PLAN: ESWL of his right mid ureteral stone.

## 2015-12-17 NOTE — Discharge Instructions (Signed)
Lithotripsy, Care After °Refer to this sheet in the next few weeks. These instructions provide you with information on caring for yourself after your procedure. Your health care provider may also give you more specific instructions. Your treatment has been planned according to current medical practices, but problems sometimes occur. Call your health care provider if you have any problems or questions after your procedure. °WHAT TO EXPECT AFTER THE PROCEDURE  °· Your urine may have a red tinge for a few days after treatment. Blood loss is usually minimal. °· You may have soreness in the back or flank area. This usually goes away after a few days. The procedure can cause blotches or bruises on the back where the pressure wave enters the skin. These marks usually cause only minimal discomfort and should disappear in a short time. °· Stone fragments should begin to pass within 24 hours of treatment. However, a delayed passage is not unusual. °· You may have pain, discomfort, and feel sick to your stomach (nauseated) when the crushed fragments of stone are passed down the tube from the kidney to the bladder. Stone fragments can pass soon after the procedure and may last for up to 4-8 weeks. °· A small number of patients may have severe pain when stone fragments are not able to pass, which leads to an obstruction. °· If your stone is greater than 1 inch (2.5 cm) in diameter or if you have multiple stones that have a combined diameter greater than 1 inch (2.5 cm), you may require more than one treatment. °· If you had a stent placed prior to your procedure, you may experience some discomfort, especially during urination. You may experience the pain or discomfort in your flank or back, or you may experience a sharp pain or discomfort at the base of your penis or in your lower abdomen. The discomfort usually lasts only a few minutes after urinating. °HOME CARE INSTRUCTIONS  °· Rest at home until you feel your energy  improving. °· Only take over-the-counter or prescription medicines for pain, discomfort, or fever as directed by your health care provider. Depending on the type of lithotripsy, you may need to take antibiotics and anti-inflammatory medicines for a few days. °· Drink enough water and fluids to keep your urine clear or pale yellow. This helps "flush" your kidneys. It helps pass any remaining pieces of stone and prevents stones from coming back. °· Most people can resume daily activities within 1-2 days after standard lithotripsy. It can take longer to recover from laser and percutaneous lithotripsy. °· Strain all urine through the provided strainer. Keep all particulate matter and stones for your health care provider to see. The stone may be as small as a grain of salt. It is very important to use the strainer each and every time you pass your urine. Any stones that are found can be sent to a medical lab for examination. °· Visit your health care provider for a follow-up appointment in a few weeks. Your doctor may remove your stent if you have one. Your health care provider will also check to see whether stone particles still remain. °SEEK MEDICAL CARE IF:  °· Your pain is not relieved by medicine. °· You have a lasting nauseous feeling. °· You feel there is too much blood in the urine. °· You develop persistent problems with frequent or painful urination that does not at least partially improve after 2 days following the procedure. °· You have a congested cough. °· You feel   lightheaded.  You develop a rash or any other signs that might suggest an allergic problem.  You develop any reaction or side effects to your medicine(s). SEEK IMMEDIATE MEDICAL CARE IF:   You experience severe back or flank pain or both.  You see nothing but blood when you urinate.  You cannot pass any urine at all.  You have a fever or shaking chills.  You develop shortness of breath, difficulty breathing, or chest pain.  You  develop vomiting that will not stop after 6-8 hours.  You have a fainting episode. This information is not intended to replace advice given to you by your health care provider. Make sure you discuss any questions you have with your health care provider. Document Released: 01/26/2007 Document Revised: 09/27/2014 Document Reviewed: 07/22/2012 Elsevier Interactive Patient Education  2017 [employer].

## 2015-12-17 NOTE — Op Note (Signed)
See [facility] OP note scanned into chart. Also because of the size, density, location and other factors that cannot be anticipated I feel this will likely be a staged procedure. This fact supersedes any indication in the scanned [facility] operative note to the contrary.  

## 2016-04-04 DIAGNOSIS — R3912 Poor urinary stream: Secondary | ICD-10-CM | POA: Diagnosis not present

## 2016-04-04 DIAGNOSIS — N202 Calculus of kidney with calculus of ureter: Secondary | ICD-10-CM | POA: Diagnosis not present

## 2016-04-04 DIAGNOSIS — R3129 Other microscopic hematuria: Secondary | ICD-10-CM | POA: Diagnosis not present

## 2016-11-13 DIAGNOSIS — Z23 Encounter for immunization: Secondary | ICD-10-CM | POA: Diagnosis not present

## 2017-02-17 DIAGNOSIS — Z23 Encounter for immunization: Secondary | ICD-10-CM | POA: Diagnosis not present

## 2017-02-17 DIAGNOSIS — I10 Essential (primary) hypertension: Secondary | ICD-10-CM | POA: Diagnosis not present

## 2017-02-17 DIAGNOSIS — Z13220 Encounter for screening for lipoid disorders: Secondary | ICD-10-CM | POA: Diagnosis not present

## 2017-02-17 DIAGNOSIS — Z0000 Encounter for general adult medical examination without abnormal findings: Secondary | ICD-10-CM | POA: Diagnosis not present

## 2017-02-17 DIAGNOSIS — R61 Generalized hyperhidrosis: Secondary | ICD-10-CM | POA: Diagnosis not present

## 2017-08-04 DIAGNOSIS — M961 Postlaminectomy syndrome, not elsewhere classified: Secondary | ICD-10-CM | POA: Diagnosis not present

## 2017-08-04 DIAGNOSIS — M79643 Pain in unspecified hand: Secondary | ICD-10-CM | POA: Diagnosis not present

## 2017-08-11 ENCOUNTER — Ambulatory Visit
Admission: RE | Admit: 2017-08-11 | Discharge: 2017-08-11 | Disposition: A | Discharge status: Home / Self Care | Payer: 59 | Source: Ambulatory Visit | Attending: Family Medicine | Admitting: Family Medicine

## 2017-08-11 ENCOUNTER — Other Ambulatory Visit: Payer: Self-pay | Admitting: Family Medicine

## 2017-08-11 DIAGNOSIS — M79643 Pain in unspecified hand: Secondary | ICD-10-CM

## 2017-08-11 DIAGNOSIS — M19041 Primary osteoarthritis, right hand: Secondary | ICD-10-CM | POA: Diagnosis not present

## 2017-08-11 DIAGNOSIS — M7989 Other specified soft tissue disorders: Secondary | ICD-10-CM | POA: Diagnosis not present

## 2018-01-28 DIAGNOSIS — Z23 Encounter for immunization: Secondary | ICD-10-CM | POA: Diagnosis not present

## 2018-02-10 DIAGNOSIS — J019 Acute sinusitis, unspecified: Secondary | ICD-10-CM | POA: Diagnosis not present

## 2018-02-18 DIAGNOSIS — J209 Acute bronchitis, unspecified: Secondary | ICD-10-CM | POA: Diagnosis not present

## 2018-02-23 DIAGNOSIS — J00 Acute nasopharyngitis [common cold]: Secondary | ICD-10-CM | POA: Diagnosis not present

## 2018-03-04 DIAGNOSIS — Z0000 Encounter for general adult medical examination without abnormal findings: Secondary | ICD-10-CM | POA: Diagnosis not present

## 2018-03-04 DIAGNOSIS — Z23 Encounter for immunization: Secondary | ICD-10-CM | POA: Diagnosis not present

## 2018-03-04 DIAGNOSIS — I10 Essential (primary) hypertension: Secondary | ICD-10-CM | POA: Diagnosis not present

## 2018-03-15 DIAGNOSIS — E1169 Type 2 diabetes mellitus with other specified complication: Secondary | ICD-10-CM | POA: Diagnosis not present

## 2019-05-18 ENCOUNTER — Ambulatory Visit: Payer: Self-pay | Admitting: Podiatry

## 2019-09-08 DIAGNOSIS — M25512 Pain in left shoulder: Secondary | ICD-10-CM | POA: Insufficient documentation

## 2019-09-23 ENCOUNTER — Other Ambulatory Visit: Payer: Self-pay

## 2019-09-23 ENCOUNTER — Emergency Department (HOSPITAL_COMMUNITY)
Admission: EM | Admit: 2019-09-23 | Discharge: 2019-09-23 | Disposition: A | Discharge status: Home / Self Care | Payer: 59 | Attending: Emergency Medicine | Admitting: Emergency Medicine

## 2019-09-23 ENCOUNTER — Emergency Department (HOSPITAL_COMMUNITY): Payer: 59

## 2019-09-23 DIAGNOSIS — R0602 Shortness of breath: Secondary | ICD-10-CM | POA: Insufficient documentation

## 2019-09-23 DIAGNOSIS — Z5321 Procedure and treatment not carried out due to patient leaving prior to being seen by health care provider: Secondary | ICD-10-CM | POA: Insufficient documentation

## 2019-09-23 DIAGNOSIS — R05 Cough: Secondary | ICD-10-CM | POA: Insufficient documentation

## 2019-09-23 DIAGNOSIS — R509 Fever, unspecified: Secondary | ICD-10-CM | POA: Insufficient documentation

## 2019-09-23 DIAGNOSIS — M791 Myalgia, unspecified site: Secondary | ICD-10-CM | POA: Diagnosis not present

## 2019-09-23 DIAGNOSIS — U071 COVID-19: Secondary | ICD-10-CM | POA: Diagnosis not present

## 2019-09-23 LAB — BASIC METABOLIC PANEL
Anion gap: 13 (ref 5–15)
BUN: 13 mg/dL (ref 8–23)
CO2: 29 mmol/L (ref 22–32)
Calcium: 8.4 mg/dL — ABNORMAL LOW (ref 8.9–10.3)
Chloride: 91 mmol/L — ABNORMAL LOW (ref 98–111)
Creatinine, Ser: 0.92 mg/dL (ref 0.61–1.24)
GFR calc Af Amer: >60 mL/min (ref >60)
GFR calc non Af Amer: >60 mL/min (ref >60)
Glucose, Bld: 132 mg/dL — ABNORMAL HIGH (ref 70–99)
Potassium: 2.7 mmol/L — CL (ref 3.5–5.1)
Sodium: 133 mmol/L — ABNORMAL LOW (ref 135–145)

## 2019-09-23 LAB — CBC WITH DIFFERENTIAL/PLATELET
Abs Immature Granulocytes: 0.05 10*3/uL (ref 0.00–0.07)
Basophils Absolute: 0 10*3/uL (ref 0.0–0.1)
Basophils Relative: 0 %
Eosinophils Absolute: 0 10*3/uL (ref 0.0–0.5)
Eosinophils Relative: 0 %
HCT: 44.6 % (ref 39.0–52.0)
Hemoglobin: 14.8 g/dL (ref 13.0–17.0)
Immature Granulocytes: 1 %
Lymphocytes Relative: 11 %
Lymphs Abs: 0.8 10*3/uL (ref 0.7–4.0)
MCH: 29.4 pg (ref 26.0–34.0)
MCHC: 33.2 g/dL (ref 30.0–36.0)
MCV: 88.7 fL (ref 80.0–100.0)
Monocytes Absolute: 0.6 10*3/uL (ref 0.1–1.0)
Monocytes Relative: 10 %
Neutro Abs: 5.3 10*3/uL (ref 1.7–7.7)
Neutrophils Relative %: 78 %
Platelets: 234 10*3/uL (ref 150–400)
RBC: 5.03 MIL/uL (ref 4.22–5.81)
RDW: 13 % (ref 11.5–15.5)
WBC: 6.7 10*3/uL (ref 4.0–10.5)
nRBC: 0 % (ref 0.0–0.2)

## 2019-09-23 LAB — SARS CORONAVIRUS 2 BY RT PCR (HOSPITAL ORDER, PERFORMED IN ~~LOC~~ HOSPITAL LAB): SARS Coronavirus 2: POSITIVE — AB

## 2019-09-23 NOTE — ED Notes (Signed)
Pt stated he left due to long wait time.

## 2019-09-23 NOTE — ED Triage Notes (Signed)
Pt here with c/o dry cough, fever, shortness of breath, and body aches x 1 week. Girlfriend has covid. 81 % SpO2 on room air. Improved to 93% on 4 L.

## 2019-11-15 DIAGNOSIS — M533 Sacrococcygeal disorders, not elsewhere classified: Secondary | ICD-10-CM | POA: Insufficient documentation

## 2019-11-16 ENCOUNTER — Other Ambulatory Visit: Payer: Self-pay | Admitting: Orthopedic Surgery

## 2019-11-16 DIAGNOSIS — M259 Joint disorder, unspecified: Secondary | ICD-10-CM

## 2020-08-15 ENCOUNTER — Ambulatory Visit (INDEPENDENT_AMBULATORY_CARE_PROVIDER_SITE_OTHER): Payer: 59

## 2020-08-15 ENCOUNTER — Encounter: Payer: Self-pay | Admitting: Podiatry

## 2020-08-15 ENCOUNTER — Other Ambulatory Visit: Payer: Self-pay

## 2020-08-15 ENCOUNTER — Ambulatory Visit: Payer: 59 | Admitting: Podiatry

## 2020-08-15 DIAGNOSIS — M216X9 Other acquired deformities of unspecified foot: Secondary | ICD-10-CM

## 2020-08-15 DIAGNOSIS — M21611 Bunion of right foot: Secondary | ICD-10-CM | POA: Diagnosis not present

## 2020-08-15 DIAGNOSIS — M21612 Bunion of left foot: Secondary | ICD-10-CM

## 2020-08-15 DIAGNOSIS — M21622 Bunionette of left foot: Secondary | ICD-10-CM

## 2020-08-15 DIAGNOSIS — M21619 Bunion of unspecified foot: Secondary | ICD-10-CM

## 2020-08-15 NOTE — Progress Notes (Addendum)
Subjective:   Patient ID: Sean Green, male   DOB: 65 y.o.   MRN: [id number]   HPI Patient presents stating that he has a lot of pain in his left first metatarsal over his right and a lesion and pain in his left fifth metatarsal over right and then also in the forefoot left he has some fluid buildup with swelling that occurs.  Patient tries to be active does not currently smoke and states that he does have family history with his mother having had the same problem.  Patient states he has tried wider shoes use tried soaks oral anti-inflammatories without relief of symptoms   Review of Systems  All other systems reviewed and are negative.      Objective:  Physical Exam Vitals and nursing note reviewed.  Constitutional:      Appearance: He is well-developed.  Pulmonary:     Effort: Pulmonary effort is normal.  Musculoskeletal:        General: Normal range of motion.  Skin:    General: Skin is warm.  Neurological:     Mental Status: He is alert.    Neurovascular status intact muscle strength was found to be adequate range of motion is adequate subtalar midtarsal joint with patient found to have hyperostosis redness and pain around the first metatarsal head left over right and prominent fifth metatarsal head left over right with keratotic plantar lesion that is painful fifth metatarsal left.  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  Patient states that he has tried numerous things and wants to get it fixed and needed to get it done soon secondary to his schedule.  I reviewed the surgical intervention that will be necessary in order to fix this patient's chronic problems that have not responded conservatively I reviewed x-rays with him and do the distal osteotomy shortening osteotomy with removal of spurring from the second metatarsal fifth metatarsal head resection and excision of plantar neoplasm left would be best for him.  He wants this done and wants to review consent form today.  I allowed him to review consent form going over all possible complications alternative treatments and patient understands this and after extensive review signed consent form.  He understands total recovery can take upwards of 6 months and that he will be in a boot for several weeks followed by surgical shoe and that all complications cannot be guaranteed that they will not occur.  I did dispense air fracture walker today with all instructions on usage and I want him to get used to it prior to surgery and he is scheduled and is encouraged to call with any questions which may come up prior to procedure  X-rays indicate that there is structural elevation of the 1 2 intermetatarsal angle bilateral left over right with the left being approximately 16 degrees and the right 15 degrees what appears to  be some arthritis with elongated second metatarsal left with spur formation and prominent fifth metatarsal left over right

## 2020-08-24 ENCOUNTER — Telehealth: Payer: Self-pay | Admitting: Urology

## 2020-08-24 NOTE — Telephone Encounter (Addendum)
DOS - 01/15/21  [place] BUNIONECTOMY LEFT --- 47395 MET HEAD RES. 5TH LEFT --- 28113 MET OSTEOTOMY 2ND LEFT --- 28308 [facility] BENIGN LESION LEFT --- 11426   [facility] EFFECTIVE DATE - 01/21/20   PLAN DEDUCTIBLE - $0.00 OUT OF POCKET - $500.00 W/ $194.00 REMAINING COINSURANCE - 0% COPAY - $125.00   PER [facility] SITE CPT CODES 84417, [phone], [id number] AND 18367 HAVE BEEN APPROVED, AUTH # [id number], GOOD FROM 01/15/21 - 04/15/21

## 2020-08-30 ENCOUNTER — Telehealth: Payer: Self-pay

## 2020-08-30 NOTE — Telephone Encounter (Signed)
Sean Green called to cancel his surgery with Dr. Paulla Dolly on 09/04/2020. He stated he is having issues with high BP and his PCP wants him to be seen by a Cardiologist before having surgery. He has a cardio appointment 09/20/2020. He will call me once he's cleared to reschedule surgery. Notified Dr. Paulla Dolly and Caren Griffins with [facility]

## 2020-09-10 ENCOUNTER — Encounter: Payer: 59 | Admitting: Podiatry

## 2020-09-20 ENCOUNTER — Other Ambulatory Visit: Payer: Self-pay

## 2020-09-20 ENCOUNTER — Encounter: Payer: Self-pay | Admitting: Internal Medicine

## 2020-09-20 ENCOUNTER — Ambulatory Visit: Payer: 59 | Admitting: Internal Medicine

## 2020-09-20 VITALS — BP 126/90 | HR 91 | Ht 73.5 in | Wt 282.0 lb

## 2020-09-20 DIAGNOSIS — R079 Chest pain, unspecified: Secondary | ICD-10-CM | POA: Diagnosis not present

## 2020-09-20 DIAGNOSIS — I10 Essential (primary) hypertension: Secondary | ICD-10-CM

## 2020-09-20 DIAGNOSIS — Z87891 Personal history of nicotine dependence: Secondary | ICD-10-CM | POA: Diagnosis not present

## 2020-09-20 DIAGNOSIS — R072 Precordial pain: Secondary | ICD-10-CM

## 2020-09-20 NOTE — Progress Notes (Signed)
Cardiology Office Note:    Date:  09/20/2020   ID:  Sean Green, DOB 1955/03/12, MRN [id number]  PCP:  Jonathon Jordan, MD   [place] [facility] Cardiologist:  Werner Lean, MD     Referring MD: Glenis Smoker, *   CC: Chest Pain Consulted for the evaluation of chest pain at the behest of Jonathon Jordan, MD  History of Present Illness:    Sean Green is a 65 y.o. male with a hx of pericarditis in 2011; history of mild non-obstructive CAD in 2011 Former smoker (distant), HTN with morbid obesity.  Presents 09/20/20.  Patient notes that he is feeling exertional issues.  This summer was moving lumbar to help his GF with building a deck in the hot sun.  Felt chest soreness.   Discomfort occurs with exertion and improves with rest.  Patient exertion notable for mowing the grass with diaphoresis and feels no symptoms. No radiation of pain.  No shortness of breath, DOE.  No PND or orthopnea.  No weight gain, leg swelling , or abdominal swelling.  No syncope or near syncope . Notes  no palpitations or funny heart beats.   No leg claudication.  Had bone spurs in the left foot that was cancelled until this is resolved.   Past Medical History:  Diagnosis Date   Anxiety    Arthritis    "joints" (09/08/2013)   BPH (benign prostatic hyperplasia)    Complication of anesthesia    woke up during hip surgery   Coronary artery disease 2011   right coronary artery 20%   Dizziness    History of kidney stones    Hypertension    takes medication daily   Hypogonadism male    Leukocytosis, unspecified    Myocardial infarction ([place])    Neuromuscular disorder ([place])    numbess/tingling in left hand intermittently   Nocturia associated with benign prostatic hypertrophy    Pericarditis 2011   Pneumonia X 1   PONV (postoperative nausea and vomiting)     Past Surgical History:  Procedure Laterality Date   ANTERIOR CERVICAL DECOMP/DISCECTOMY FUSION  06/25/2011   Procedure: ANTERIOR  CERVICAL DECOMPRESSION/DISCECTOMY FUSION 3 LEVELS;  Surgeon: Hosie Spangle, MD;  Location: [facility];  Service: Neurosurgery;  Laterality: N/A;  Cervical Four-Five Cervical Five-Six Cervical Six-Seven Anterior Cervical Decompression with Fusion plating and bonegraft   BACK SURGERY     CARDIAC CATHETERIZATION  02/2009   CARPAL TUNNEL RELEASE Right ~ 1991   JOINT REPLACEMENT     LUMBAR LAMINECTOMY Left 09/07/2013   L5-S1   LUMBAR LAMINECTOMY/DECOMPRESSION MICRODISCECTOMY Left 09/07/2013   Procedure: LAMINECTOMY L5 - S1 ON THE LEFT 1 LEVEL;  Surgeon: Melina Schools, MD;  Location: [facility];  Service: Orthopedics;  Laterality: Left;   SHOULDER ARTHROSCOPY W/ ROTATOR CUFF REPAIR Right 2002   TOTAL HIP ARTHROPLASTY Right 2008    Current Medications: Current Meds  Medication Sig   amLODipine (NORVASC) 5 MG tablet amlodipine 5 mg tablet  TAKE 1 TABLET BY MOUTH EVERY DAY   aspirin EC 81 MG tablet Take 81 mg by mouth daily.   citalopram (CELEXA) 40 MG tablet Take 40 mg by mouth daily.     lisinopril-hydrochlorothiazide (ZESTORETIC) 20-12.5 MG tablet Take 2 tablets by mouth daily.   Olopatadine HCl 0.2 % SOLN Place 1 drop into both eyes daily as needed (for allergies).      Allergies:   Other, Starch, Adhesive [tape], and Latex   Social History  Socioeconomic History   Marital status: Divorced    Spouse name: Not on file   Number of children: Not on file   Years of education: Not on file   Highest education level: Not on file  Occupational History   Not on file  Tobacco Use   Smoking status: Former    Packs/day: 0.50    Years: 10.00    Pack years: 5.00    Types: Cigarettes   Smokeless tobacco: Former   Tobacco comments:    "quit smoking in my 19's; quit chewing in my 40's"  Substance and Sexual Activity   Alcohol use: Yes    Alcohol/week: 0.0 standard drinks    Comment: 09/08/2013 "might have 3 drinks/ year, if that"   Drug use: No   Sexual activity: Not Currently  Other  Topics Concern   Not on file  Social History Narrative   Lives alone in a two story home.  Has 2 grown daughters.  Worked as a Brewing technologist.  Currently not working.  Education: one year of college.   Social Determinants of Health   Financial Resource Strain: Not on file  Food Insecurity: Not on file  Transportation Needs: Not on file  Physical Activity: Not on file  Stress: Not on file  Social Connections: Not on file     Family History: The patient's family history is negative for Anesthesia problems, Hypotension, Malignant hyperthermia, and Pseudochol deficiency. History of coronary artery disease notable for father. History of heart failure notable for no members. History of arrhythmia notable for no members.   ROS:   Please see the history of present illness.     All other systems reviewed and are negative.  EKGs/Labs/Other Studies Reviewed:    The following studies were reviewed today:  EKG:  EKG is  ordered today.  The ekg ordered today demonstrates  09/20/20: SR rate 91 WNL  Transthoracic Echocardiogram: Date:03/19/2009 Results: Report Only Study Conclusions    - Left ventricle: The cavity size was normal. Wall thickness was     increased in a pattern of mild LVH. Systolic function was normal.     The estimated ejection fraction was in the range of 55% to 60%.   - Mitral valve: Mild regurgitation.   - Pulmonary arteries: PA peak pressure: 27m Hg (S).  Recent Labs: 09/23/2019: BUN 13; Creatinine, Ser 0.92; Hemoglobin 14.8; Platelets 234; Potassium 2.7; Sodium 133  Recent Lipid Panel    Component Value Date/Time   CHOL  03/19/2009 0630    80        ATP III CLASSIFICATION:  <200     mg/dL   Desirable  200-239  mg/dL   Borderline High  >=240    mg/dL   High          TRIG 38 03/19/2009 0630   HDL 29 (L) 03/19/2009 0630   CHOLHDL 2.8 03/19/2009 0630   VLDL 8 03/19/2009 0630   LDLCALC  03/19/2009 0630    43        Total Cholesterol/HDL:CHD Risk Coronary  Heart Disease Risk Table                     Men   Women  1/2 Average Risk   3.4   3.3  Average Risk       5.0   4.4  2 X Average Risk   9.6   7.1  3 X Average Risk  23.4   11.0  Use the calculated Patient Ratio above and the CHD Risk Table to determine the patient's CHD Risk.        ATP III CLASSIFICATION (LDL):  <100     mg/dL   Optimal  100-129  mg/dL   Near or Above                    Optimal  130-159  mg/dL   Borderline  160-189  mg/dL   High  >190     mg/dL   Very High        Physical Exam:    VS:  BP 126/90   Pulse 91   Ht 6' 1.5" (1.867 m)   Wt 282 lb (127.9 kg)   SpO2 96%   BMI 36.70 kg/m     Wt Readings from Last 3 Encounters:  09/20/20 282 lb (127.9 kg)  09/23/19 270 lb (122.5 kg)  12/17/15 296 lb 9.6 oz (134.5 kg)     GEN: Obese well developed in no acute distress HEENT: Normal NECK: No JVD LYMPHATICS: No lymphadenopathy CARDIAC: RRR, no murmurs, rubs, gallops RESPIRATORY:  Clear to auscultation without rales, wheezing or rhonchi  ABDOMEN: Soft, non-tender, non-distended MUSCULOSKELETAL:  No edema; No deformity  SKIN: Warm and dry NEUROLOGIC:  Alert and oriented x 3 PSYCHIATRIC:  Normal affect   ASSESSMENT:    1. Chest pain of uncertain etiology   2. Primary hypertension   3. Former tobacco use   4. Morbid obesity ([place])   5. Precordial pain    PLAN:    Chest pain in the setting of former mild non obstructive CAD, HTN, HLD, and  Morbid Obesity Tobacco abuse (former) - unable to walk on treadmill due to bone spurs - will do lexi-scan if negative ok for foot surgery - continue lisinopril-hctz 12.5 mg   - continue norvasx 5 mg  - continue ASA  Three months  follow up unless new symptoms or abnormal test results warranting change in plan     Shared Decision Making/Informed Consent The risks [chest pain, shortness of breath, cardiac arrhythmias, dizziness, blood pressure fluctuations, myocardial infarction, stroke/transient ischemic  attack, nausea, vomiting, allergic reaction, radiation exposure, metallic taste sensation and life-threatening complications (estimated to be 1 in 10,000)], benefits (risk stratification, diagnosing coronary artery disease, treatment guidance) and alternatives of a nuclear stress test were discussed in detail with Sean Green and he agrees to proceed.    Medication Adjustments/Labs and Tests Ordered: Current medicines are reviewed at length with the patient today.  Concerns regarding medicines are outlined above.  Orders Placed This Encounter  Procedures   Cardiac Stress Test: Informed Consent Details: Physician/Practitioner Attestation; Transcribe to consent form and obtain patient signature   MYOCARDIAL PERFUSION IMAGING   EKG 12-Lead    No orders of the defined types were placed in this encounter.   Patient Instructions  Medication Instructions:  Your physician recommends that you continue on your current medications as directed. Please refer to the Current Medication list given to you today.  *If you need a refill on your cardiac medications before your next appointment, please call your pharmacy*   Lab Work: NONE If you have labs (blood work) drawn today and your tests are completely normal, you will receive your results only by: [place] (if you have MyChart) OR A paper copy in the mail If you have any lab test that is abnormal or we need to change your treatment, we will call you to review the results.  Testing/Procedures: Your physician has requested that you have a lexiscan myoview. For further information please visit [email or web address]. Please follow instruction sheet, as given.    Follow-Up: At [facility], you and your health needs are our priority.  As part of our continuing mission to provide you with exceptional heart care, we have created designated Provider Care Teams.  These Care Teams include your primary Cardiologist (physician) and Advanced Practice  Providers (APPs -  Physician Assistants and Nurse Practitioners) who all work together to provide you with the care you need, when you need it.  We recommend signing up for the patient portal called "MyChart".  Sign up information is provided on this After Visit Summary.  MyChart is used to connect with patients for Virtual Visits (Telemedicine).  Patients are able to view lab/test results, encounter notes, upcoming appointments, etc.  Non-urgent messages can be sent to your provider as well.   To learn more about what you can do with MyChart, go to [email or web address].    Your next appointment:   3 month(s)  The format for your next appointment:   In Person  Provider:   You may see Werner Lean, MD or one of the following Advanced Practice Providers on your designated Care Team:   Melina Copa, PA-C Ermalinda Barrios, PA-C   Other Instructions  You are scheduled for a Myocardial Perfusion Imaging Study. Please arrive 15 minutes prior to your appointment time for registration and insurance purposes.   The test will take approximately 3 to 4 hours to complete; you may bring reading material.  If someone comes with you to your appointment, they will need to remain in the main lobby due to limited space in the testing area.    How to prepare for your Myocardial Perfusion Test: Do not eat or drink 3 hours prior to your test, except you may have water. Do not consume products containing caffeine (regular or decaffeinated) 12 hours prior to your test. (ex: coffee, chocolate, sodas, tea). Do bring a list of your current medications with you.  If not listed below, you may take your medications as normal. Do wear comfortable clothes (no dresses or overalls) and walking shoes, tennis shoes preferred (No heels or open toe shoes are allowed). Do NOT wear cologne, perfume, aftershave, or lotions (deodorant is allowed). If these instructions are not followed, your test will have to be  rescheduled.  If you cannot keep your appointment, please provide 24 hours notification to the Nuclear Lab, to avoid a possible $50 charge to your account.        Signed, Werner Lean, MD  09/20/2020 4:29 PM    [place]

## 2020-09-20 NOTE — Patient Instructions (Signed)
Medication Instructions:  Your physician recommends that you continue on your current medications as directed. Please refer to the Current Medication list given to you today.  *If you need a refill on your cardiac medications before your next appointment, please call your pharmacy*   Lab Work: NONE If you have labs (blood work) drawn today and your tests are completely normal, you will receive your results only by: [place] (if you have MyChart) OR A paper copy in the mail If you have any lab test that is abnormal or we need to change your treatment, we will call you to review the results.   Testing/Procedures: Your physician has requested that you have a lexiscan myoview. For further information please visit [email or web address]. Please follow instruction sheet, as given.    Follow-Up: At [facility], you and your health needs are our priority.  As part of our continuing mission to provide you with exceptional heart care, we have created designated Provider Care Teams.  These Care Teams include your primary Cardiologist (physician) and Advanced Practice Providers (APPs -  Physician Assistants and Nurse Practitioners) who all work together to provide you with the care you need, when you need it.  We recommend signing up for the patient portal called "MyChart".  Sign up information is provided on this After Visit Summary.  MyChart is used to connect with patients for Virtual Visits (Telemedicine).  Patients are able to view lab/test results, encounter notes, upcoming appointments, etc.  Non-urgent messages can be sent to your provider as well.   To learn more about what you can do with MyChart, go to [email or web address].    Your next appointment:   3 month(s)  The format for your next appointment:   In Person  Provider:   You may see Werner Lean, MD or one of the following Advanced Practice Providers on your designated Care Team:   Melina Copa, PA-C Ermalinda Barrios, PA-C   Other Instructions  You are scheduled for a Myocardial Perfusion Imaging Study. Please arrive 15 minutes prior to your appointment time for registration and insurance purposes.   The test will take approximately 3 to 4 hours to complete; you may bring reading material.  If someone comes with you to your appointment, they will need to remain in the main lobby due to limited space in the testing area.    How to prepare for your Myocardial Perfusion Test: Do not eat or drink 3 hours prior to your test, except you may have water. Do not consume products containing caffeine (regular or decaffeinated) 12 hours prior to your test. (ex: coffee, chocolate, sodas, tea). Do bring a list of your current medications with you.  If not listed below, you may take your medications as normal. Do wear comfortable clothes (no dresses or overalls) and walking shoes, tennis shoes preferred (No heels or open toe shoes are allowed). Do NOT wear cologne, perfume, aftershave, or lotions (deodorant is allowed). If these instructions are not followed, your test will have to be rescheduled.  If you cannot keep your appointment, please provide 24 hours notification to the Nuclear Lab, to avoid a possible $50 charge to your account.

## 2020-10-04 ENCOUNTER — Ambulatory Visit (HOSPITAL_COMMUNITY): Payer: 59

## 2020-10-05 ENCOUNTER — Ambulatory Visit (HOSPITAL_COMMUNITY): Payer: 59

## 2020-11-12 ENCOUNTER — Telehealth (HOSPITAL_COMMUNITY): Payer: Self-pay | Admitting: Internal Medicine

## 2020-11-12 NOTE — Telephone Encounter (Signed)
Patient cancelled Myoview and will call back to reschedule. Order will be removed from [facility] and when they call back we will reinstate the order.

## 2020-11-19 ENCOUNTER — Telehealth (HOSPITAL_COMMUNITY): Payer: Self-pay

## 2020-11-19 ENCOUNTER — Encounter (HOSPITAL_COMMUNITY): Payer: 59

## 2020-11-19 NOTE — Telephone Encounter (Signed)
Attempted to contact the patient. He has requested a 1 day study instead of 2. Left a message on the patient's answering machine for him to call me back. S.Garrell Flagg EMTP

## 2020-11-20 ENCOUNTER — Ambulatory Visit (HOSPITAL_COMMUNITY): Payer: 59

## 2020-11-22 ENCOUNTER — Ambulatory Visit (HOSPITAL_COMMUNITY): Payer: 59 | Attending: Cardiovascular Disease

## 2020-11-22 ENCOUNTER — Other Ambulatory Visit: Payer: Self-pay

## 2020-11-22 DIAGNOSIS — R072 Precordial pain: Secondary | ICD-10-CM | POA: Diagnosis present

## 2020-11-22 MED ORDER — REGADENOSON 0.4 MG/5ML IV SOLN
0.4000 mg | Freq: Once | INTRAVENOUS | Status: AC
Start: 2020-11-22 — End: 2020-11-22
  Administered 2020-11-22: 0.4 mg via INTRAVENOUS

## 2020-11-22 MED ORDER — TECHNETIUM TC 99M TETROFOSMIN IV KIT
31.5000 | PACK | Freq: Once | INTRAVENOUS | Status: AC | PRN
  Administered 2020-11-22: 31.5 via INTRAVENOUS
  Filled 2020-11-22: qty 32

## 2020-11-23 ENCOUNTER — Ambulatory Visit (HOSPITAL_COMMUNITY): Payer: 59 | Attending: Cardiovascular Disease

## 2020-11-23 LAB — MYOCARDIAL PERFUSION IMAGING
Base ST Depression (mm): 0 mm
LV dias vol: 85 mL (ref 62–150)
LV sys vol: 36 mL
Nuc Stress EF: 58 %
Peak HR: 101 {beats}/min
Rest HR: 80 {beats}/min
Rest Nuclear Isotope Dose: 30.6 mCi
SDS: 0
SRS: 0
SSS: 0
ST Depression (mm): 0 mm
Stress Nuclear Isotope Dose: 31.5 mCi
TID: 0.91

## 2020-11-23 MED ORDER — TECHNETIUM TC 99M TETROFOSMIN IV KIT
30.6000 | PACK | Freq: Once | INTRAVENOUS | Status: AC | PRN
  Administered 2020-11-23: 30.6 via INTRAVENOUS
  Filled 2020-11-23: qty 31

## 2020-11-26 ENCOUNTER — Encounter (HOSPITAL_COMMUNITY): Payer: 59

## 2020-11-26 NOTE — Telephone Encounter (Signed)
Last progress note from Dr. Gasper Sells and stress test results sent to Dr. Ila Mcgill per pt my chart message.

## 2020-11-27 ENCOUNTER — Ambulatory Visit (HOSPITAL_COMMUNITY): Payer: 59

## 2020-12-26 ENCOUNTER — Telehealth: Payer: Self-pay | Admitting: Internal Medicine

## 2020-12-26 NOTE — Telephone Encounter (Signed)
Patient called to see if his appt on Monday 12/12 is need being that he has been cleared for his surgery. Please advise

## 2020-12-26 NOTE — Telephone Encounter (Signed)
I spoke with Dr. Gasper Sells he expresses that 3 month follow up will discuss ways to prevent cardiac events.  If pt does not want to attend visit it's okay.  I advised pt of this information.  Pt expresses that PCP discusses prevention strategies and is ordered to see a dietitian.  I advised pt to call back if he needs anything further.

## 2020-12-30 NOTE — Progress Notes (Deleted)
Cardiology Office Note:    Date:  12/30/2020   ID:  Sean Green, DOB 1955-03-12, MRN [id number]  PCP:  Jonathon Jordan, MD   [facility] Providers Cardiologist:  Werner Lean, MD     Referring MD: Jonathon Jordan, MD   CC: follow up stress test  History of Present Illness:    Sean Green is a 65 y.o. male with a hx of pericarditis in 2011; history of mild non-obstructive CAD in 2011 Former smoker (distant), HTN with morbid obesity.  Presents 09/20/20. In interim of this visit, patient had negative stress test and was deemed reasonable for foot surgery.  Seen 12/31/20.  Patient notes that he is doing ***.   Since day prior/last visit notes *** . There are no*** interval hospital/ED visit.    No chest pain or pressure ***.  No SOB/DOE*** and no PND/Orthopnea***.  No weight gain or leg swelling***.  No palpitations or syncope ***.  Ambulatory blood pressure ***.   Past Medical History:  Diagnosis Date   Anxiety    Arthritis    "joints" (09/08/2013)   BPH (benign prostatic hyperplasia)    Complication of anesthesia    woke up during hip surgery   Coronary artery disease 2011   right coronary artery 20%   Dizziness    History of kidney stones    Hypertension    takes medication daily   Hypogonadism male    Leukocytosis, unspecified    Myocardial infarction ([place])    Neuromuscular disorder ([place])    numbess/tingling in left hand intermittently   Nocturia associated with benign prostatic hypertrophy    Pericarditis 2011   Pneumonia X 1   PONV (postoperative nausea and vomiting)     Past Surgical History:  Procedure Laterality Date   ANTERIOR CERVICAL DECOMP/DISCECTOMY FUSION  06/25/2011   Procedure: ANTERIOR CERVICAL DECOMPRESSION/DISCECTOMY FUSION 3 LEVELS;  Surgeon: Hosie Spangle, MD;  Location: [facility];  Service: Neurosurgery;  Laterality: N/A;  Cervical Four-Five Cervical Five-Six Cervical Six-Seven Anterior Cervical Decompression with Fusion  plating and bonegraft   BACK SURGERY     CARDIAC CATHETERIZATION  02/2009   CARPAL TUNNEL RELEASE Right ~ 1991   JOINT REPLACEMENT     LUMBAR LAMINECTOMY Left 09/07/2013   L5-S1   LUMBAR LAMINECTOMY/DECOMPRESSION MICRODISCECTOMY Left 09/07/2013   Procedure: LAMINECTOMY L5 - S1 ON THE LEFT 1 LEVEL;  Surgeon: Melina Schools, MD;  Location: [facility];  Service: Orthopedics;  Laterality: Left;   SHOULDER ARTHROSCOPY W/ ROTATOR CUFF REPAIR Right 2002   TOTAL HIP ARTHROPLASTY Right 2008    Current Medications: No outpatient medications have been marked as taking for the 12/31/20 encounter (Appointment) with Werner Lean, MD.     Allergies:   Other, Starch, Adhesive [tape], and Latex   Social History   Socioeconomic History   Marital status: Divorced    Spouse name: Not on file   Number of children: Not on file   Years of education: Not on file   Highest education level: Not on file  Occupational History   Not on file  Tobacco Use   Smoking status: Former    Packs/day: 0.50    Years: 10.00    Pack years: 5.00    Types: Cigarettes   Smokeless tobacco: Former   Tobacco comments:    "quit smoking in my 74's; quit chewing in my 33's"  Substance and Sexual Activity   Alcohol use: Yes    Alcohol/week: 0.0 standard drinks  Comment: 09/08/2013 "might have 3 drinks/ year, if that"   Drug use: No   Sexual activity: Not Currently  Other Topics Concern   Not on file  Social History Narrative   Lives alone in a two story home.  Has 2 grown daughters.  Worked as a Brewing technologist.  Currently not working.  Education: one year of college.   Social Determinants of Health   Financial Resource Strain: Not on file  Food Insecurity: Not on file  Transportation Needs: Not on file  Physical Activity: Not on file  Stress: Not on file  Social Connections: Not on file     Family History: The patient's family history is negative for Anesthesia problems, Hypotension, Malignant  hyperthermia, and Pseudochol deficiency. History of coronary artery disease notable for father. History of heart failure notable for no members. History of arrhythmia notable for no members.   ROS:   Please see the history of present illness.     All other systems reviewed and are negative.  EKGs/Labs/Other Studies Reviewed:    The following studies were reviewed today:  EKG:    09/20/20: SR rate 91 WNL  ECG or NM Stress Testing : Date: 11/23/20 Results:  The study is normal. The study is low risk.   No ST deviation was noted.   LV perfusion is normal. There is no evidence of ischemia. There is no evidence of infarction.   Left ventricular function is normal. Nuclear stress EF: 58 %. The left ventricular ejection fraction is normal (55-65%). End diastolic cavity size is normal. End systolic cavity size is normal.   Prior study not available for comparison.  Transthoracic Echocardiogram: Date:03/19/2009 Results: Report Only Study Conclusions    - Left ventricle: The cavity size was normal. Wall thickness was     increased in a pattern of mild LVH. Systolic function was normal.     The estimated ejection fraction was in the range of 55% to 60%.   - Mitral valve: Mild regurgitation.   - Pulmonary arteries: PA peak pressure: 81mm Hg (S).  Recent Labs: No results found for requested labs within last 8760 hours.  Recent Lipid Panel    Component Value Date/Time   CHOL  03/19/2009 0630    80        ATP III CLASSIFICATION:  <200     mg/dL   Desirable  200-239  mg/dL   Borderline High  >=240    mg/dL   High          TRIG 38 03/19/2009 0630   HDL 29 (L) 03/19/2009 0630   CHOLHDL 2.8 03/19/2009 0630   VLDL 8 03/19/2009 0630   LDLCALC  03/19/2009 0630    43        Total Cholesterol/HDL:CHD Risk Coronary Heart Disease Risk Table                     Men   Women  1/2 Average Risk   3.4   3.3  Average Risk       5.0   4.4  2 X Average Risk   9.6   7.1  3 X Average Risk  23.4    11.0        Use the calculated Patient Ratio above and the CHD Risk Table to determine the patient's CHD Risk.        ATP III CLASSIFICATION (LDL):  <100     mg/dL   Optimal  100-129  mg/dL  Near or Above                    Optimal  130-159  mg/dL   Borderline  160-189  mg/dL   High  >190     mg/dL   Very High        Physical Exam:    VS:  There were no vitals taken for this visit.    Wt Readings from Last 3 Encounters:  11/22/20 282 lb (127.9 kg)  09/20/20 282 lb (127.9 kg)  09/23/19 270 lb (122.5 kg)    Gen: *** distress, *** obese/well nourished/malnourished   Neck: No JVD, *** carotid bruit Ears: *** Frank Sign Cardiac: No Rubs or Gallops, *** Murmur, ***cardia, *** radial pulses Respiratory: Clear to auscultation bilaterally, *** effort, ***  respiratory rate GI: Soft, nontender, non-distended *** MS: No *** edema; *** moves all extremities Integument: Skin feels *** Neuro:  At time of evaluation, alert and oriented to person/place/time/situation *** Psych: Normal affect, patient feels ***   ASSESSMENT:    No diagnosis found.  PLAN:    Mild non obstructive CAD, HTN, HLD Morbid Obesity Tobacco abuse (former) - continue lisinopril-hctz 12.5 mg   - continue norvasx 5 mg  - continue ASA - LDL goal < 55; ***   Medication Adjustments/Labs and Tests Ordered: Current medicines are reviewed at length with the patient today.  Concerns regarding medicines are outlined above.  No orders of the defined types were placed in this encounter.   No orders of the defined types were placed in this encounter.   There are no Patient Instructions on file for this visit.   Signed, Werner Lean, MD  12/30/2020 4:03 PM     [facility]

## 2020-12-31 ENCOUNTER — Ambulatory Visit: Payer: 59 | Admitting: Internal Medicine

## 2020-12-31 ENCOUNTER — Encounter: Payer: Self-pay | Admitting: Internal Medicine

## 2021-01-14 MED ORDER — OXYCODONE-ACETAMINOPHEN 10-325 MG PO TABS: 1.0000 | ORAL_TABLET | ORAL | 0 refills | Status: DC | PRN

## 2021-01-14 MED ORDER — ONDANSETRON HCL 4 MG PO TABS: 4.0000 mg | ORAL_TABLET | Freq: Three times a day (TID) | ORAL | 0 refills | Status: DC | PRN

## 2021-01-14 NOTE — Addendum Note (Signed)
Addended by: Wallene Huh on: 01/14/2021 10:09 PM   Modules accepted: Orders

## 2021-01-15 ENCOUNTER — Encounter: Payer: Self-pay | Admitting: Podiatry

## 2021-01-15 DIAGNOSIS — D492 Neoplasm of unspecified behavior of bone, soft tissue, and skin: Secondary | ICD-10-CM

## 2021-01-15 DIAGNOSIS — M2012 Hallux valgus (acquired), left foot: Secondary | ICD-10-CM

## 2021-01-15 DIAGNOSIS — M21542 Acquired clubfoot, left foot: Secondary | ICD-10-CM

## 2021-01-23 ENCOUNTER — Telehealth: Payer: Self-pay | Admitting: *Deleted

## 2021-01-23 ENCOUNTER — Ambulatory Visit (INDEPENDENT_AMBULATORY_CARE_PROVIDER_SITE_OTHER): Payer: 59

## 2021-01-23 ENCOUNTER — Ambulatory Visit (INDEPENDENT_AMBULATORY_CARE_PROVIDER_SITE_OTHER): Payer: 59 | Admitting: Podiatry

## 2021-01-23 ENCOUNTER — Encounter: Payer: Self-pay | Admitting: Podiatry

## 2021-01-23 ENCOUNTER — Other Ambulatory Visit: Payer: Self-pay

## 2021-01-23 DIAGNOSIS — Z9889 Other specified postprocedural states: Secondary | ICD-10-CM

## 2021-01-23 NOTE — Progress Notes (Signed)
Subjective:   Patient ID: Sean Green, male   DOB: 66 y.o.   MRN: [id number]   HPI Patient presents stating the left foot is doing well and I am very pleased with mild discomfort I still use crutches when I am out but overall I am walking on it well   ROS      Objective:  Physical Exam  Neurovascular status intact negative Bevelyn Buckles' sign noted left foot healing well wound edges well coapted hallux in rectus position mild edema consistent with this.  Postop     Assessment:  Doing well post forefoot reconstruction left     Plan:  H&P x-rays reviewed sterile dressing reapplied continue elevation compression immobilization reappoint 3 weeks or earlier if needed he mentioned the right foot we may consider something for that as we get further into the left foot and will be evaluated at next visit  X-rays indicate osteotomy is healing well fixation in good alignment good overall correction

## 2021-01-23 NOTE — Telephone Encounter (Signed)
Patient is calling and forgot to ask if he should keep elevating his foot/leg, especially at night.  Returned the call, no answer, lt vmessage that patient is to follow the same instructions given for elevation.

## 2021-01-24 NOTE — Telephone Encounter (Signed)
Patient notified, verbalized understanding

## 2021-01-24 NOTE — Telephone Encounter (Signed)
Patient has been informed to continue to elevate,verbalized understanding.

## 2021-01-24 NOTE — Telephone Encounter (Signed)
Continue to elevate when he can.

## 2021-02-04 ENCOUNTER — Ambulatory Visit (INDEPENDENT_AMBULATORY_CARE_PROVIDER_SITE_OTHER): Payer: 59 | Admitting: Podiatry

## 2021-02-04 ENCOUNTER — Other Ambulatory Visit: Payer: Self-pay

## 2021-02-04 DIAGNOSIS — Z9889 Other specified postprocedural states: Secondary | ICD-10-CM

## 2021-02-04 NOTE — Progress Notes (Signed)
Patient in office today for suture removal. Sutures were removed from the lateral aspect of the left foot without complication. Patient denies nausea, vomiting, fever and chills. Patient still notes swelling in the left foot. Encouraged patient to continue wearing compression and elevating the left foot throughout the day. Patient verbalized understanding.

## 2021-02-13 ENCOUNTER — Other Ambulatory Visit: Payer: Self-pay

## 2021-02-13 ENCOUNTER — Encounter: Payer: Self-pay | Admitting: Podiatry

## 2021-02-13 ENCOUNTER — Ambulatory Visit (INDEPENDENT_AMBULATORY_CARE_PROVIDER_SITE_OTHER): Payer: 59 | Admitting: Podiatry

## 2021-02-13 ENCOUNTER — Ambulatory Visit (INDEPENDENT_AMBULATORY_CARE_PROVIDER_SITE_OTHER): Payer: 59

## 2021-02-13 DIAGNOSIS — Z9889 Other specified postprocedural states: Secondary | ICD-10-CM

## 2021-02-13 NOTE — Progress Notes (Signed)
Subjective:   Patient ID: Sean Green, male   DOB: 66 y.o.   MRN: [id number]   HPI Patient presents stating doing very well with my left foot with everything healing wound edges well coapted good range of motion no pain   ROS      Objective:  Physical Exam  Neurovascular status intact negative Bevelyn Buckles' sign noted wound edges well coapted first second and fifth metatarsal left no drainage mild edema     Assessment:  Doing well post forefoot reconstruction left     Plan:  X-rays taken reviewed and allow patient to gradual return to soft shoe gear with ankle compression stocking dispensed.  Advised on continued elevation ice as needed ibuprofen and reappoint 4 weeks  X-rays indicate osteotomy is healing well good alignment noted and fixation in place needs right foot but organ to hold off currently and wait for little bit longer on the left

## 2021-02-28 IMAGING — DX DG CHEST 1V PORT
1 series · 1 of 1 positions shown · non-contrast
Comparison: 06/02/2014

CLINICAL DATA: Dyspnea, COVID pneumonia

EXAM:
PORTABLE CHEST 1 VIEW

[chest]
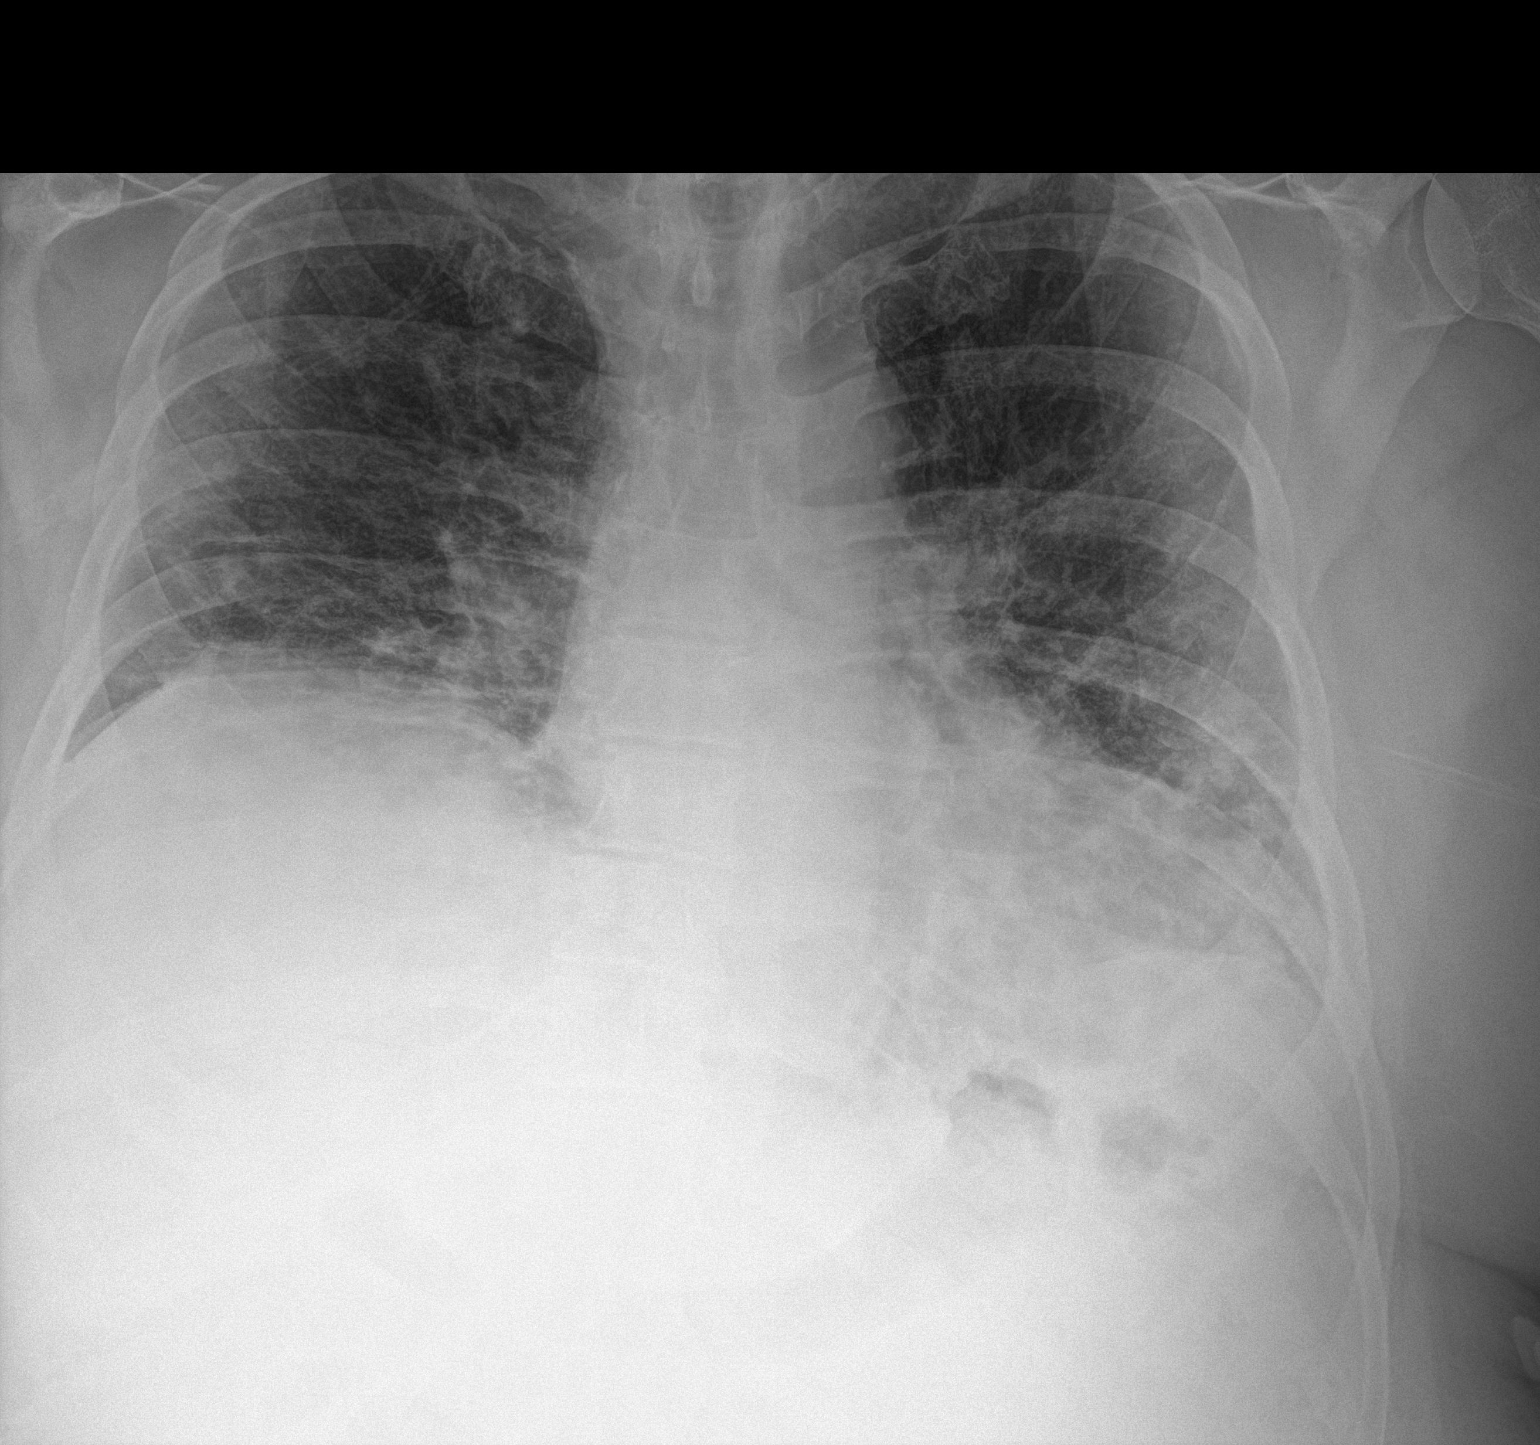

[1 of 1 positions shown; findings below may reference images not displayed]

FINDINGS: Lung volumes are small, but remains stable since prior examination.
Superimposed asymmetric, mild bilateral interstitial and airspace
infiltrate is seen predominantly at the lung bases, likely
infectious or inflammatory in nature. No pneumothorax or pleural
effusion. Cardiac size within normal limits. Pulmonary vascularity
is normal.
IMPRESSION: Mild, asymmetric, bilateral interstitial and airspace opacities at
the lung bases, likely infectious or inflammatory.

## 2021-03-20 ENCOUNTER — Ambulatory Visit (INDEPENDENT_AMBULATORY_CARE_PROVIDER_SITE_OTHER): Payer: 59

## 2021-03-20 ENCOUNTER — Other Ambulatory Visit: Payer: Self-pay

## 2021-03-20 ENCOUNTER — Encounter: Payer: Self-pay | Admitting: Podiatry

## 2021-03-20 ENCOUNTER — Ambulatory Visit (INDEPENDENT_AMBULATORY_CARE_PROVIDER_SITE_OTHER): Payer: 59 | Admitting: Podiatry

## 2021-03-20 DIAGNOSIS — Z9889 Other specified postprocedural states: Secondary | ICD-10-CM

## 2021-03-21 NOTE — Progress Notes (Signed)
Subjective:  ? ?Patient ID: Sean Green, male   DOB: 66 y.o.   MRN: [id number]  ? ?HPI ?Patient presents stating that he is doing great with his left foot very pleased with his swelling going down dramatically ? ? ?ROS ? ? ?   ?Objective:  ?Physical Exam  ?Neurovascular status intact negative Bevelyn Buckles' sign noted wound edges left healed well good alignment noted good range of motion ? ?   ?Assessment:  ?Doing well post forefoot reconstruction left good alignment no pain ? ?   ?Plan:  ?Final x-rays reviewed May return to normal activities explained if any further problems should occur he is to be seen back ? ?X-rays indicate osteotomies healing well alignment good second metatarsal looks healthy currently no indication of arthritis with screw in place ?   ? ? ?

## 2021-04-25 DIAGNOSIS — M5431 Sciatica, right side: Secondary | ICD-10-CM | POA: Diagnosis not present

## 2021-04-25 DIAGNOSIS — M545 Low back pain, unspecified: Secondary | ICD-10-CM | POA: Diagnosis not present

## 2021-06-18 DIAGNOSIS — M259 Joint disorder, unspecified: Secondary | ICD-10-CM | POA: Diagnosis not present

## 2021-06-18 DIAGNOSIS — M545 Low back pain, unspecified: Secondary | ICD-10-CM | POA: Diagnosis not present

## 2021-07-24 ENCOUNTER — Other Ambulatory Visit: Payer: Self-pay | Admitting: Orthopedic Surgery

## 2021-07-24 DIAGNOSIS — M259 Joint disorder, unspecified: Secondary | ICD-10-CM

## 2021-07-26 ENCOUNTER — Encounter: Payer: Self-pay | Admitting: Podiatry

## 2021-07-26 ENCOUNTER — Ambulatory Visit (INDEPENDENT_AMBULATORY_CARE_PROVIDER_SITE_OTHER): Payer: PPO

## 2021-07-26 ENCOUNTER — Ambulatory Visit (INDEPENDENT_AMBULATORY_CARE_PROVIDER_SITE_OTHER): Payer: PPO | Admitting: Podiatry

## 2021-07-26 DIAGNOSIS — S99922A Unspecified injury of left foot, initial encounter: Secondary | ICD-10-CM

## 2021-07-27 NOTE — Progress Notes (Signed)
Subjective:   Patient ID: Sean Green, male   DOB: 66 y.o.   MRN: [id number]   HPI Patient states he injured his left foot about a week ago and he is concerned about what may have occurred as he had surgery a number of months ago   ROS      Objective:  Physical Exam  Neurovascular status intact with patient found to have dorsal inflammation left foot localized no specific area no skin abrasion or other pathology noted with good incision site healing from previous surgery     Assessment:  Trauma to the left foot cannot rule out bony injury with good healing from previous surgery     Plan:  H&P x-ray reviewed recommended that he continue with conservative care ice therapy and it should heal uneventfully will be seen back as needed  X-rays were negative for signs of fracture or bony injury with fixation in place excellent correction from previous procedures

## 2021-07-30 ENCOUNTER — Ambulatory Visit (HOSPITAL_BASED_OUTPATIENT_CLINIC_OR_DEPARTMENT_OTHER): Payer: PPO | Attending: Orthopedic Surgery | Admitting: Physical Therapy

## 2021-07-30 ENCOUNTER — Encounter (HOSPITAL_BASED_OUTPATIENT_CLINIC_OR_DEPARTMENT_OTHER): Payer: Self-pay | Admitting: Physical Therapy

## 2021-07-30 DIAGNOSIS — M6281 Muscle weakness (generalized): Secondary | ICD-10-CM | POA: Insufficient documentation

## 2021-07-30 DIAGNOSIS — M545 Low back pain, unspecified: Secondary | ICD-10-CM | POA: Insufficient documentation

## 2021-07-30 DIAGNOSIS — R262 Difficulty in walking, not elsewhere classified: Secondary | ICD-10-CM | POA: Insufficient documentation

## 2021-07-30 DIAGNOSIS — M5459 Other low back pain: Secondary | ICD-10-CM

## 2021-07-30 NOTE — Therapy (Incomplete)
OUTPATIENT PHYSICAL THERAPY THORACOLUMBAR EVALUATION   Patient Name: Sean Green MRN: [id number] DOB:27-Oct-1955, 66 y.o., male Today's Date: 07/31/2021   PT End of Session - 07/30/21 1352     Visit Number 1    Number of Visits 12    Date for PT Re-Evaluation 09/10/21    Authorization Type HTA    PT Start Time 1347    PT Stop Time 1449    PT Time Calculation (min) 62 min    Activity Tolerance Patient tolerated treatment well    Behavior During Therapy [facility] for tasks assessed/performed             Past Medical History:  Diagnosis Date   Anxiety    Arthritis    "joints" (09/08/2013)   BPH (benign prostatic hyperplasia)    Complication of anesthesia    woke up during hip surgery   Coronary artery disease 2011   right coronary artery 20%   Dizziness    History of kidney stones    Hypertension    takes medication daily   Hypogonadism male    Leukocytosis, unspecified    Myocardial infarction ([place])    Neuromuscular disorder ([place])    numbess/tingling in left hand intermittently   Nocturia associated with benign prostatic hypertrophy    Pericarditis 2011   Pneumonia X 1   PONV (postoperative nausea and vomiting)    Past Surgical History:  Procedure Laterality Date   ANTERIOR CERVICAL DECOMP/DISCECTOMY FUSION  06/25/2011   Procedure: ANTERIOR CERVICAL DECOMPRESSION/DISCECTOMY FUSION 3 LEVELS;  Surgeon: Hosie Spangle, MD;  Location: [facility];  Service: Neurosurgery;  Laterality: N/A;  Cervical Four-Five Cervical Five-Six Cervical Six-Seven Anterior Cervical Decompression with Fusion plating and bonegraft   BACK SURGERY     CARDIAC CATHETERIZATION  02/2009   CARPAL TUNNEL RELEASE Right ~ 1991   JOINT REPLACEMENT     LUMBAR LAMINECTOMY Left 09/07/2013   L5-S1   LUMBAR LAMINECTOMY/DECOMPRESSION MICRODISCECTOMY Left 09/07/2013   Procedure: LAMINECTOMY L5 - S1 ON THE LEFT 1 LEVEL;  Surgeon: Melina Schools, MD;  Location: [place];  Service: Orthopedics;  Laterality: Left;    SHOULDER ARTHROSCOPY W/ ROTATOR CUFF REPAIR Right 2002   TOTAL HIP ARTHROPLASTY Right 2008   Patient Active Problem List   Diagnosis Date Noted   Chest pain of uncertain etiology 75/10/2583   Morbid obesity ([place]) 09/20/2020   Back pain 09/07/2013   Hypertension    BPH (benign prostatic hyperplasia)    Leukocytosis, unspecified    DYSLIPIDEMIA 03/27/2009   HYPERTENSION, UNSPECIFIED 03/27/2009   CORONARY ARTERY DISEASE 03/27/2009   DEPRESSION, HX OF 03/27/2009   Former tobacco use 03/27/2009   ACUTE IDIOPATHIC PERICARDITIS 03/18/2009    REFERRING PROVIDER: Melina Schools, MD   REFERRING DIAG: M54.50 (ICD-10-CM) - Low back pain, unspecified   Rationale for Evaluation and Treatment Rehabilitation  THERAPY DIAG:  Other low back pain  Muscle weakness (generalized)  Difficulty in walking, not elsewhere classified  ONSET DATE: Chronic back pain  SUBJECTIVE:  SUBJECTIVE STATEMENT: Pt reports he bulged a disc in 2016 when getting OOB and underwent lumbar decompression/discectomy.  Pt returned to work though was limited.  He states his lumbar strength never fully returned.  He had a fall in 2021 due to instability/weakness of his right leg and tore his L RTC.  Pt states his lumbar pain has worsened over the past year.  He can occasionally have L sided pain though is not currently.  MD note indicated imaging studies do demonstrate degenerative disease which could account for the back pain. The facet arthrosis can also lead to lateral recess stenosis and nerve irritation.  no obvious acute bony abnormalities with degenerative changes noted.  MD note indicated signs and symptoms consistent with sciatica.  MD ordered a Medrol dosepak which did improve sx's.  He returned to MD and MD indicated his exam was  consistent with L SI joint pain.  MD ordered aquatic therapy and a L SI injection.  Pt has the L SI injection scheduled.    Pt reports having weakness in R LE which occasionally buckles.  His last fall was in February.  Pt has fear of falling due to R LE instability.  Pt has difficulty with performing sit/stand transfers and car transfers.  He has numbness and weakness when getting out of car.  Pt unable to stoop to perform household chores.  He is limited with standing duration and performing cooking and household chores.  He states 15-20 mins is his max time for standing.  Pt has increased pain with stairs.  He is limited with ambulation distance and is very fatigued after grocery shopping.  Pt received PT a couple of years ago.  He states he improved in function though continued to have significant pain.  Pt reports having L foot surgery in Dec 2022 and has hardware present.  He continues to have foot pain and has pain with ambulation.         PERTINENT HISTORY:  Chronic back pain, lumbar decompression/discectomy in 2016, R THR in 2008 with posterolateral approach, L foot surgery in Dec 2022 and has hardware present, L RCR in 2021, R shoulder surgery in 2000, cervical surgery (possible fusion) LATEX ALLERGY  PAIN:  Are you having pain? Yes: NPRS:  5/10 current, 11/10 worst, 2-3/10 best  Location:  R sided lower lumbar currently.  He can have pain travel down R LE to ankle but more to post knee.  Pt has N/T in R LE.  Easing Factors:  lying supine in his bed, sitting in recliner with it reclined back Aggravating Factors:  sitting, driving, walking, bending, standing.   PRECAUTIONS: Other: LATEX ALLERGY, R THR in 2008, L foot surgery in December, cervical surgery  WEIGHT BEARING RESTRICTIONS No  FALLS:  Has patient fallen in last 6 months? Yes. Number of falls 3 due to R LE weakness/instability  LIVING ENVIRONMENT: Lives with: lives alone Lives in: 2 story home Stairs: yes Has  following equipment at home: Single point cane, Environmental consultant - 2 wheeled, and Crutches  OCCUPATION: pt is retired.  PLOF: Independent; Pt has a hx of chronic back pain.  His pain has worsened over the past year affecting his daily act's and mobility.     PATIENT GOALS reduce pain and preferably not have any pain.  Improve walking and to be able to walk pain free   OBJECTIVE:   DIAGNOSTIC FINDINGS:  Unable to see imaging reports.  MD notes indicate degenerative disease and facet arthrosis.  no  obvious acute bony abnormalities with degenerative changes  PATIENT SURVEYS:  FOTO 30 with a goal of 48 at visit #13  SCREENING FOR RED FLAGS: Bowel or bladder incontinence: No Spinal tumors: No Cauda equina syndrome: No Compression fracture: No Abdominal aneurysm: No  COGNITION:  Overall cognitive status: Within functional limits for tasks assessed      PALPATION: TTP:  R sided lumbar paraspinals  LUMBAR ROM:   Active  A/PROM  eval  Flexion WNL   Extension WNL with 2/10 pain  Right lateral flexion WFL  Left lateral flexion 40% with pain  Right rotation WFL  Left rotation WFL   (Blank rows = not tested)   LOWER EXTREMITY MMT:    MMT Right eval Left eval  Hip flexion 4/5 with back pain 4/5 with L thigh pain  Hip extension    Hip abduction <3/5 5/5  Hip adduction    Hip internal rotation    Hip external rotation Unable to tolerate resistance  5/5   Knee flexion 5/5 tested in sitting 5/5 tested in sitting  Knee extension 5/5 5/5  Ankle dorsiflexion    Ankle plantarflexion    Ankle inversion    Ankle eversion     (Blank rows = not tested)  LUMBAR SPECIAL TESTS:  Supine SLR: R:  lumbar pain with HS stretch though no radicular pain with passive DF ; L:   FUNCTIONAL TESTS:   Pt is slow and has pain and difficulty with sit to transfers.   GAIT: Assistive device utilized: None Comments: Significantly antalgic gait, favors L LE with reduced Wb'ing thru L LE, decreased toe  off and knee flexion on L, very slow gait speed    TODAY'S TREATMENT  Educated pt concerning aquatic therapy process and aquatic benefits and properties. See below for pt education.       PATIENT EDUCATION:  Education details: Dx, POC, purpose of aquatic therapy, and objective findings.  PT answered pt's questions.  Person educated: Patient Education method: Explanation, demonstration Education comprehension: verbalized understanding and needs further education   HOME EXERCISE PROGRAM: Will give at a later date  ASSESSMENT:  CLINICAL IMPRESSION: Patient is a 65 y.o. male with a dx of LBP presenting to the clinic with lumbar and R LE pain, muscle weakness in R > L LE, and difficulty in walking.  Pt has a hx of chronic back pain with lumbar decompression/discectomy in 2016.  He reports worsening pain over the past year.  A recent MD note indicated L SI joint pain and pt has scheduled a SI injection.  Pt c/o's of R LE instability with occasional knee buckling with ambulation.  Pt has fear of falling due to R LE instability.  Pt has difficulty with performing sit/stand transfers and car transfers.  He is limited with standing duration, household chores, and ambulation distance.  Pt had L foot surgery in December which also affects his gait and tolerance to ambulation.  Pt should benefit from skilled PT services to address impairments and improve overall function.      OBJECTIVE IMPAIRMENTS Abnormal gait, decreased activity tolerance, decreased balance, decreased endurance, decreased mobility, difficulty walking, decreased ROM, decreased strength, hypomobility, impaired flexibility, and pain.   ACTIVITY LIMITATIONS lifting, bending, standing, stairs, and locomotion level and sitting  PARTICIPATION LIMITATIONS: meal prep, cleaning, driving, shopping, and community activity  PERSONAL FACTORS Time since onset of injury/illness/exacerbation and 3+ comorbidities: R THR in 2008 with  posterolateral approach, L foot surgery in Dec 2022, L RCR  are also affecting patient's functional outcome.   REHAB POTENTIAL: Good  CLINICAL DECISION MAKING: Evolving/moderate complexity  EVALUATION COMPLEXITY: Moderate   GOALS:  SHORT TERM GOALS: Target date: 08/20/2021  Pt will tolerate aquatic therapy without adverse effects for improved pain, strength, function, and tolerance to activity.  Baseline: Goal status: INITIAL  2.  Pt's worst pain will be less than 9/10 for improved mobility.  Baseline:  Goal status: INITIAL  3.  Pt will demo improved ease and speed with sit to stand transfers and also have reduced pain. Baseline:  Goal status: INITIAL  4.  Pt will report at least a 25% improvement in stability t/o R LE with ambulation.  Baseline:  Goal status: INITIAL   LONG TERM GOALS: Target date: 09/10/2021  Pt will be able to perform extended ambulation distance without significant pain and fatigue including grocery shopping. Baseline:  Goal status: INITIAL  2.  Pt will report no R LE instability with community ambulation. Baseline:  Goal status: INITIAL  3.  Pt will demo improved bilat hip flexion strength and at least 4-/5 hip abduction strength for improved tolerance to and performance of functional mobility.  Baseline:  Goal status: INITIAL  4.  Pt will be able to perform his daily transfers without significant pain and difficulty.  Baseline:  Goal status: INITIAL  5.  Pt will be able to stand to cook without significant pain and difficulty.  Baseline:  Goal status: INITIAL    PLAN: PT FREQUENCY: 2x/week  PT DURATION: 6 weeks  PLANNED INTERVENTIONS: Therapeutic exercises, Therapeutic activity, Neuromuscular re-education, Balance training, Gait training, Patient/Family education, Joint mobilization, Stair training, Aquatic Therapy, Dry Needling, Electrical stimulation, Spinal mobilization, Cryotherapy, Moist heat, Taping, Manual therapy, and  Re-evaluation.  PLAN FOR NEXT SESSION: Cont with aquatic therapy.  Pt has a L SI injection scheduled.    Selinda Michaels III PT, DPT 07/31/21 5:14 PM

## 2021-08-02 DIAGNOSIS — D128 Benign neoplasm of rectum: Secondary | ICD-10-CM | POA: Diagnosis not present

## 2021-08-02 DIAGNOSIS — K648 Other hemorrhoids: Secondary | ICD-10-CM | POA: Diagnosis not present

## 2021-08-02 DIAGNOSIS — K5730 Diverticulosis of large intestine without perforation or abscess without bleeding: Secondary | ICD-10-CM | POA: Diagnosis not present

## 2021-08-02 DIAGNOSIS — Z1211 Encounter for screening for malignant neoplasm of colon: Secondary | ICD-10-CM | POA: Diagnosis not present

## 2021-08-07 ENCOUNTER — Ambulatory Visit: Payer: PPO | Admitting: Podiatry

## 2021-08-07 DIAGNOSIS — D128 Benign neoplasm of rectum: Secondary | ICD-10-CM | POA: Diagnosis not present

## 2021-08-20 ENCOUNTER — Ambulatory Visit (HOSPITAL_BASED_OUTPATIENT_CLINIC_OR_DEPARTMENT_OTHER): Payer: PPO | Admitting: Physical Therapy

## 2021-08-26 ENCOUNTER — Other Ambulatory Visit: Payer: Self-pay

## 2021-08-27 ENCOUNTER — Ambulatory Visit (HOSPITAL_BASED_OUTPATIENT_CLINIC_OR_DEPARTMENT_OTHER): Payer: PPO | Admitting: Physical Therapy

## 2021-08-30 ENCOUNTER — Ambulatory Visit (HOSPITAL_BASED_OUTPATIENT_CLINIC_OR_DEPARTMENT_OTHER): Payer: PPO | Admitting: Physical Therapy

## 2021-09-03 ENCOUNTER — Ambulatory Visit (HOSPITAL_BASED_OUTPATIENT_CLINIC_OR_DEPARTMENT_OTHER): Payer: PPO | Admitting: Physical Therapy

## 2021-09-05 ENCOUNTER — Ambulatory Visit (HOSPITAL_BASED_OUTPATIENT_CLINIC_OR_DEPARTMENT_OTHER): Payer: PPO | Attending: Orthopedic Surgery | Admitting: Physical Therapy

## 2021-09-05 ENCOUNTER — Encounter (HOSPITAL_BASED_OUTPATIENT_CLINIC_OR_DEPARTMENT_OTHER): Payer: Self-pay | Admitting: Physical Therapy

## 2021-09-05 DIAGNOSIS — M5459 Other low back pain: Secondary | ICD-10-CM | POA: Diagnosis not present

## 2021-09-05 DIAGNOSIS — M6281 Muscle weakness (generalized): Secondary | ICD-10-CM | POA: Insufficient documentation

## 2021-09-05 DIAGNOSIS — R262 Difficulty in walking, not elsewhere classified: Secondary | ICD-10-CM | POA: Insufficient documentation

## 2021-09-05 NOTE — Therapy (Signed)
OUTPATIENT PHYSICAL THERAPY THORACOLUMBAR TREATMENT    Patient Name: Sean Green MRN: [id number] DOB:1955/07/27, 66 y.o., male Today's Date: 09/05/2021   PT End of Session - 09/05/21 1401     Visit Number 2    Number of Visits 12    Date for PT Re-Evaluation 09/10/21    Authorization Type HTA    PT Start Time 1400    PT Stop Time 1440    PT Time Calculation (min) 40 min    Activity Tolerance Patient tolerated treatment well    Behavior During Therapy [facility] for tasks assessed/performed             Past Medical History:  Diagnosis Date   Anxiety    Arthritis    "joints" (09/08/2013)   BPH (benign prostatic hyperplasia)    Complication of anesthesia    woke up during hip surgery   Coronary artery disease 2011   right coronary artery 20%   Dizziness    History of kidney stones    Hypertension    takes medication daily   Hypogonadism male    Leukocytosis, unspecified    Myocardial infarction ([place])    Neuromuscular disorder ([place])    numbess/tingling in left hand intermittently   Nocturia associated with benign prostatic hypertrophy    Pericarditis 2011   Pneumonia X 1   PONV (postoperative nausea and vomiting)    Past Surgical History:  Procedure Laterality Date   ANTERIOR CERVICAL DECOMP/DISCECTOMY FUSION  06/25/2011   Procedure: ANTERIOR CERVICAL DECOMPRESSION/DISCECTOMY FUSION 3 LEVELS;  Surgeon: Hosie Spangle, MD;  Location: [facility];  Service: Neurosurgery;  Laterality: N/A;  Cervical Four-Five Cervical Five-Six Cervical Six-Seven Anterior Cervical Decompression with Fusion plating and bonegraft   BACK SURGERY     CARDIAC CATHETERIZATION  02/2009   CARPAL TUNNEL RELEASE Right ~ 1991   JOINT REPLACEMENT     LUMBAR LAMINECTOMY Left 09/07/2013   L5-S1   LUMBAR LAMINECTOMY/DECOMPRESSION MICRODISCECTOMY Left 09/07/2013   Procedure: LAMINECTOMY L5 - S1 ON THE LEFT 1 LEVEL;  Surgeon: Melina Schools, MD;  Location: [place];  Service: Orthopedics;  Laterality: Left;    SHOULDER ARTHROSCOPY W/ ROTATOR CUFF REPAIR Right 2002   TOTAL HIP ARTHROPLASTY Right 2008   Patient Active Problem List   Diagnosis Date Noted   Chest pain of uncertain etiology 70/35/0093   Morbid obesity ([place]) 09/20/2020   Back pain 09/07/2013   Hypertension    BPH (benign prostatic hyperplasia)    Leukocytosis, unspecified    DYSLIPIDEMIA 03/27/2009   HYPERTENSION, UNSPECIFIED 03/27/2009   CORONARY ARTERY DISEASE 03/27/2009   DEPRESSION, HX OF 03/27/2009   Former tobacco use 03/27/2009   ACUTE IDIOPATHIC PERICARDITIS 03/18/2009    REFERRING PROVIDER: Melina Schools, MD   REFERRING DIAG: M54.50 (ICD-10-CM) - Low back pain, unspecified   Rationale for Evaluation and Treatment Rehabilitation  THERAPY DIAG:  Other low back pain  Muscle weakness (generalized)  Difficulty in walking, not elsewhere classified  ONSET DATE: Chronic back pain  SUBJECTIVE:  SUBJECTIVE STATEMENT: "I'm feeling pretty good today."  He states he haven't been doing the stuff he normally does, ie: tinker around house, mowing grass. He has not received the SI injection yet; not sure if he wants it.  Pt has been away from therapy since he has had people working on house.      PERTINENT HISTORY:  Chronic back pain, lumbar decompression/discectomy in 2016, R THR in 2008 with posterolateral approach, L foot surgery in Dec 2022 and has hardware present, L RCR in 2021, R shoulder surgery in 2000, cervical surgery (possible fusion) LATEX ALLERGY  PAIN:  Are you having pain? Yes: NPRS:  3/10 current  Location:  R sided lower lumbar currently.  He can have pain travel down R LE to ankle but more to post knee.  Pt has N/T in R LE.  Easing Factors:  lying supine in his bed, sitting in recliner with it reclined back Aggravating  Factors:  sitting, driving, walking, bending, standing.   PRECAUTIONS: Other: LATEX ALLERGY, R THR in 2008, L foot surgery in December, cervical surgery  WEIGHT BEARING RESTRICTIONS No  FALLS:  Has patient fallen in last 6 months? Yes. Number of falls 3 due to R LE weakness/instability  LIVING ENVIRONMENT: Lives with: lives alone Lives in: 2 story home Stairs: yes Has following equipment at home: Single point cane, Environmental consultant - 2 wheeled, and Crutches  OCCUPATION: pt is retired.  PLOF: Independent; Pt has a hx of chronic back pain.  His pain has worsened over the past year affecting his daily act's and mobility.     PATIENT GOALS reduce pain and preferably not have any pain.  Improve walking and to be able to walk pain free   OBJECTIVE:   DIAGNOSTIC FINDINGS:  Unable to see imaging reports.  MD notes indicate degenerative disease and facet arthrosis.  no obvious acute bony abnormalities with degenerative changes  PATIENT SURVEYS:  FOTO 30 with a goal of 48 at visit #13  SCREENING FOR RED FLAGS: Bowel or bladder incontinence: No Spinal tumors: No Cauda equina syndrome: No Compression fracture: No Abdominal aneurysm: No  COGNITION:  Overall cognitive status: Within functional limits for tasks assessed      PALPATION: TTP:  R sided lumbar paraspinals  LUMBAR ROM:   Active  A/PROM  eval  Flexion WNL   Extension WNL with 2/10 pain  Right lateral flexion WFL  Left lateral flexion 40% with pain  Right rotation WFL  Left rotation WFL   (Blank rows = not tested)   LOWER EXTREMITY MMT:    MMT Right eval Left eval  Hip flexion 4/5 with back pain 4/5 with L thigh pain  Hip extension    Hip abduction <3/5 5/5  Hip adduction    Hip internal rotation    Hip external rotation Unable to tolerate resistance  5/5   Knee flexion 5/5 tested in sitting 5/5 tested in sitting  Knee extension 5/5 5/5  Ankle dorsiflexion    Ankle plantarflexion    Ankle inversion    Ankle  eversion     (Blank rows = not tested)  LUMBAR SPECIAL TESTS:  Supine SLR: R:  lumbar pain with HS stretch though no radicular pain with passive DF ; L:   FUNCTIONAL TESTS:   Pt is slow and has pain and difficulty with sit to transfers.   GAIT: Assistive device utilized: None Comments: Significantly antalgic gait, favors L LE with reduced Wb'ing thru L LE, decreased toe off and  knee flexion on L, very slow gait speed    TODAY'S TREATMENT  Pt seen for aquatic therapy today.  Treatment took place in water 3.25-4.5 ft in depth at the [facility]. Temp of water was 91.  Pt entered/exited the pool via stair independently with bilat rail.  * cues to engage core when rising from seated position for lumbar support; returns demo * intro to [employer] * forward/ backward walking with reciprocal arm swing in 4+ ft of water * side stepping; repeated with arm abdct/add with rainbow hand buoys * side squat with arm abdct with rainbow hand buoys - increased LB and R knee pain - stopped * high knee marching forward/ backward * Holding wall: squats x 10, core engaged when returning to upright; heel raises x 10; hip abdct/ add; hip ext * return to walking forward / backward  * supported supine ( nekdoodle, yellow noodle under arms, blue noodle under legs) for decompression of spine   Pt requires the buoyancy and hydrostatic pressure of water for support, and to offload joints by unweighting joint load by at least 50 % in navel deep water and by at least 75-80% in chest to neck deep water.  Viscosity of the water is needed for resistance of strengthening. Water current perturbations provides challenge to standing balance requiring increased core activation.        PATIENT EDUCATION:  Education details: Dx, POC, purpose of aquatic therapy, and objective findings.  PT answered pt's questions.  Person educated: Patient Education method: Explanation, demonstration Education comprehension:  verbalized understanding and needs further education   HOME EXERCISE PROGRAM: TBD  ASSESSMENT:  CLINICAL IMPRESSION: This is pt's initial aquatic therapy visit; he has had a month away from therapy due to projects at his house (contractors present). He is confident in aquatic setting; able to take direction from therapist on deck and be in water without UE support on floatation device. He reported reduction of back pain with transitional movements with engagement of core.  Will continue educating pt on neutral spine and core engagement for spinal stabilization.  He does get increased Rt SI pain if standing on RLE in SLS too long with UE support, even submerge in  4+ft of water.  Pain decreased with even stance and with change in exercise.  Pt should benefit from skilled PT services to address impairments and improve overall function.   Goals are ongoing.    OBJECTIVE IMPAIRMENTS Abnormal gait, decreased activity tolerance, decreased balance, decreased endurance, decreased mobility, difficulty walking, decreased ROM, decreased strength, hypomobility, impaired flexibility, and pain.   ACTIVITY LIMITATIONS lifting, bending, standing, stairs, and locomotion level and sitting  PARTICIPATION LIMITATIONS: meal prep, cleaning, driving, shopping, and community activity  PERSONAL FACTORS Time since onset of injury/illness/exacerbation and 3+ comorbidities: R THR in 2008 with posterolateral approach, L foot surgery in Dec 2022, L RCR  are also affecting patient's functional outcome.   REHAB POTENTIAL: Good  CLINICAL DECISION MAKING: Evolving/moderate complexity  EVALUATION COMPLEXITY: Moderate   GOALS:  SHORT TERM GOALS: Target date: 08/20/2021  Pt will tolerate aquatic therapy without adverse effects for improved pain, strength, function, and tolerance to activity.  Baseline: Goal status: INITIAL  2.  Pt's worst pain will be less than 9/10 for improved mobility.  Baseline: 7-8/10  Goal  status: Achieved - 09/05/21  3.  Pt will demo improved ease and speed with sit to stand transfers and also have reduced pain. Baseline:  Goal status: INITIAL  4.  Pt  will report at least a 25% improvement in stability t/o R LE with ambulation.  Baseline:  Goal status: INITIAL   LONG TERM GOALS: Target date: 09/10/2021  Pt will be able to perform extended ambulation distance without significant pain and fatigue including grocery shopping. Baseline:  Goal status: INITIAL  2.  Pt will report no R LE instability with community ambulation. Baseline:  Goal status: INITIAL  3.  Pt will demo improved bilat hip flexion strength and at least 4-/5 hip abduction strength for improved tolerance to and performance of functional mobility.  Baseline:  Goal status: INITIAL  4.  Pt will be able to perform his daily transfers without significant pain and difficulty.  Baseline:  Goal status: INITIAL  5.  Pt will be able to stand to cook without significant pain and difficulty.  Baseline:  Goal status: INITIAL    PLAN: PT FREQUENCY: 2x/week  PT DURATION: 6 weeks  PLANNED INTERVENTIONS: Therapeutic exercises, Therapeutic activity, Neuromuscular re-education, Balance training, Gait training, Patient/Family education, Joint mobilization, Stair training, Aquatic Therapy, Dry Needling, Electrical stimulation, Spinal mobilization, Cryotherapy, Moist heat, Taping, Manual therapy, and Re-evaluation.  PLAN FOR NEXT SESSION: Cont with aquatic therapy; assess response to aquatics.   Kerin Perna, PTA 09/05/21 6:07 PM

## 2021-09-10 ENCOUNTER — Ambulatory Visit (HOSPITAL_BASED_OUTPATIENT_CLINIC_OR_DEPARTMENT_OTHER): Payer: PPO | Admitting: Physical Therapy

## 2021-09-12 ENCOUNTER — Ambulatory Visit (HOSPITAL_BASED_OUTPATIENT_CLINIC_OR_DEPARTMENT_OTHER): Payer: PPO | Admitting: Physical Therapy

## 2021-09-12 ENCOUNTER — Encounter (HOSPITAL_BASED_OUTPATIENT_CLINIC_OR_DEPARTMENT_OTHER): Payer: Self-pay | Admitting: Physical Therapy

## 2021-09-12 DIAGNOSIS — M5459 Other low back pain: Secondary | ICD-10-CM

## 2021-09-12 DIAGNOSIS — R262 Difficulty in walking, not elsewhere classified: Secondary | ICD-10-CM

## 2021-09-12 DIAGNOSIS — M6281 Muscle weakness (generalized): Secondary | ICD-10-CM

## 2021-09-12 NOTE — Therapy (Addendum)
OUTPATIENT PHYSICAL THERAPY THORACOLUMBAR TREATMENT    Patient Name: Sean Green MRN: [id number] DOB:17-Feb-1955, 66 y.o., male Today's Date: 09/12/2021   PT End of Session - 09/12/21 1415     Visit Number 3    Number of Visits 12    Date for PT Re-Evaluation 09/10/21    Authorization Type HTA    PT Start Time 1355    PT Stop Time 1440    PT Time Calculation (min) 45 min    Activity Tolerance Patient tolerated treatment well    Behavior During Therapy [facility] for tasks assessed/performed             Past Medical History:  Diagnosis Date   Anxiety    Arthritis    "joints" (09/08/2013)   BPH (benign prostatic hyperplasia)    Complication of anesthesia    woke up during hip surgery   Coronary artery disease 2011   right coronary artery 20%   Dizziness    History of kidney stones    Hypertension    takes medication daily   Hypogonadism male    Leukocytosis, unspecified    Myocardial infarction ([place])    Neuromuscular disorder ([place])    numbess/tingling in left hand intermittently   Nocturia associated with benign prostatic hypertrophy    Pericarditis 2011   Pneumonia X 1   PONV (postoperative nausea and vomiting)    Past Surgical History:  Procedure Laterality Date   ANTERIOR CERVICAL DECOMP/DISCECTOMY FUSION  06/25/2011   Procedure: ANTERIOR CERVICAL DECOMPRESSION/DISCECTOMY FUSION 3 LEVELS;  Surgeon: Hosie Spangle, MD;  Location: [facility];  Service: Neurosurgery;  Laterality: N/A;  Cervical Four-Five Cervical Five-Six Cervical Six-Seven Anterior Cervical Decompression with Fusion plating and bonegraft   BACK SURGERY     CARDIAC CATHETERIZATION  02/2009   CARPAL TUNNEL RELEASE Right ~ 1991   JOINT REPLACEMENT     LUMBAR LAMINECTOMY Left 09/07/2013   L5-S1   LUMBAR LAMINECTOMY/DECOMPRESSION MICRODISCECTOMY Left 09/07/2013   Procedure: LAMINECTOMY L5 - S1 ON THE LEFT 1 LEVEL;  Surgeon: Melina Schools, MD;  Location: [place];  Service: Orthopedics;  Laterality: Left;    SHOULDER ARTHROSCOPY W/ ROTATOR CUFF REPAIR Right 2002   TOTAL HIP ARTHROPLASTY Right 2008   Patient Active Problem List   Diagnosis Date Noted   Chest pain of uncertain etiology 33/38/3291   Morbid obesity ([place]) 09/20/2020   Back pain 09/07/2013   Hypertension    BPH (benign prostatic hyperplasia)    Leukocytosis, unspecified    DYSLIPIDEMIA 03/27/2009   HYPERTENSION, UNSPECIFIED 03/27/2009   CORONARY ARTERY DISEASE 03/27/2009   DEPRESSION, HX OF 03/27/2009   Former tobacco use 03/27/2009   ACUTE IDIOPATHIC PERICARDITIS 03/18/2009    REFERRING PROVIDER: Melina Schools, MD   REFERRING DIAG: M54.50 (ICD-10-CM) - Low back pain, unspecified   Rationale for Evaluation and Treatment Rehabilitation  THERAPY DIAG:  Other low back pain  Muscle weakness (generalized)  Difficulty in walking, not elsewhere classified  ONSET DATE: Chronic back pain  SUBJECTIVE:  SUBJECTIVE STATEMENT: Pt reports he was sore after last session. He iced and pain improved.  He reports that he cleaned out his closet yesterday; sore today.      PERTINENT HISTORY:  Chronic back pain, lumbar decompression/discectomy in 2016, R THR in 2008 with posterolateral approach, L foot surgery in Dec 2022 and has hardware present, L RCR in 2021, R shoulder surgery in 2000, cervical surgery (possible fusion) LATEX ALLERGY  PAIN:  Are you having pain? Yes: NPRS:  2-3/10 current  Location:  R sided lower lumbar currently.  He can have pain travel down R LE to ankle but more to post knee.  Pt has N/T in R LE.  Easing Factors:  lying supine in his bed, sitting in recliner with it reclined back Aggravating Factors:  sitting, driving, walking, bending, standing.   PRECAUTIONS: Other: LATEX ALLERGY, R THR in 2008, L foot surgery in  December, cervical surgery  WEIGHT BEARING RESTRICTIONS No  FALLS:  Has patient fallen in last 6 months? Yes. Number of falls 3 due to R LE weakness/instability  LIVING ENVIRONMENT: Lives with: lives alone Lives in: 2 story home Stairs: yes Has following equipment at home: Single point cane, Environmental consultant - 2 wheeled, and Crutches  OCCUPATION: pt is retired.  PLOF: Independent; Pt has a hx of chronic back pain.  His pain has worsened over the past year affecting his daily act's and mobility.     PATIENT GOALS reduce pain and preferably not have any pain.  Improve walking and to be able to walk pain free   OBJECTIVE:   DIAGNOSTIC FINDINGS:  Unable to see imaging reports.  MD notes indicate degenerative disease and facet arthrosis.  no obvious acute bony abnormalities with degenerative changes  PATIENT SURVEYS:  FOTO 30 with a goal of 48 at visit #13  SCREENING FOR RED FLAGS: Bowel or bladder incontinence: No Spinal tumors: No Cauda equina syndrome: No Compression fracture: No Abdominal aneurysm: No  COGNITION:  Overall cognitive status: Within functional limits for tasks assessed      PALPATION: TTP:  R sided lumbar paraspinals  LUMBAR ROM:   Active  A/PROM  eval  Flexion WNL   Extension WNL with 2/10 pain  Right lateral flexion WFL  Left lateral flexion 40% with pain  Right rotation WFL  Left rotation WFL   (Blank rows = not tested)   LOWER EXTREMITY MMT:    MMT Right eval Left eval  Hip flexion 4/5 with back pain 4/5 with L thigh pain  Hip extension    Hip abduction <3/5 5/5  Hip adduction    Hip internal rotation    Hip external rotation Unable to tolerate resistance  5/5   Knee flexion 5/5 tested in sitting 5/5 tested in sitting  Knee extension 5/5 5/5  Ankle dorsiflexion    Ankle plantarflexion    Ankle inversion    Ankle eversion     (Blank rows = not tested)  LUMBAR SPECIAL TESTS:  Supine SLR: R:  lumbar pain with HS stretch though no  radicular pain with passive DF ; L:   FUNCTIONAL TESTS:   Pt is slow and has pain and difficulty with sit to transfers.   GAIT: Assistive device utilized: None Comments: Significantly antalgic gait, favors L LE with reduced Wb'ing thru L LE, decreased toe off and knee flexion on L, very slow gait speed    TODAY'S TREATMENT  Pt seen for aquatic therapy today.  Treatment took place in water 3.25-4.75  ft in depth at the [facility] pool. Temp of water was 91.  Pt entered/exited the pool via stair independently with bilat rail.   * forward/ backward walking with reciprocal arm swing in 4+ ft of water * side stepping; repeated with arm abdct/add with rainbow/ yellow hand buoys * high knee marching with yellow hand buoys at side * Holding wall: squats x 12;  core engaged when returning to upright; heel raises x 10; hip abdct/ add; hip ext 5 x 2;  hip crosses; hip circles (heavy cues on form); stork stance with hip IR/ER; side to side lunge for adductor stretch * return to walking for recovery * hamstring stretch with foot on 2nd step x 20s x2  Pt requires the buoyancy and hydrostatic pressure of water for support, and to offload joints by unweighting joint load by at least 50 % in navel deep water and by at least 75-80% in chest to neck deep water.  Viscosity of the water is needed for resistance of strengthening. Water current perturbations provides challenge to standing balance requiring increased core activation.        PATIENT EDUCATION:  Education details:  purpose of aquatic therapy, aquatic progressions.   Person educated: Patient Education method: Explanation, demonstration Education comprehension: verbalized understanding and needs further education   HOME EXERCISE PROGRAM: TBD  ASSESSMENT:  CLINICAL IMPRESSION: This is pt's second aquatic therapy visit since starting PT; he has had a month away from therapy due to projects at his house (contractors present).  He  reported reduction of back pain with transitional movements with engagement of core; has met STG3/LTG4.  Will continue educating pt on neutral spine and core engagement for spinal stabilization. Pt reported no increase in pain during session, until exiting water.  Pt should benefit from skilled PT services to address impairments and improve overall function.    OBJECTIVE IMPAIRMENTS Abnormal gait, decreased activity tolerance, decreased balance, decreased endurance, decreased mobility, difficulty walking, decreased ROM, decreased strength, hypomobility, impaired flexibility, and pain.   ACTIVITY LIMITATIONS lifting, bending, standing, stairs, and locomotion level and sitting  PARTICIPATION LIMITATIONS: meal prep, cleaning, driving, shopping, and community activity  PERSONAL FACTORS Time since onset of injury/illness/exacerbation and 3+ comorbidities: R THR in 2008 with posterolateral approach, L foot surgery in Dec 2022, L RCR  are also affecting patient's functional outcome.   REHAB POTENTIAL: Good  CLINICAL DECISION MAKING: Evolving/moderate complexity  EVALUATION COMPLEXITY: Moderate   GOALS:  SHORT TERM GOALS: Target date: 08/20/2021  Pt will tolerate aquatic therapy without adverse effects for improved pain, strength, function, and tolerance to activity.  Baseline: Goal status: Ongoing   2.  Pt's worst pain will be less than 9/10 for improved mobility.  Baseline: 7-8/10  Goal status: Achieved - 09/05/21  3.  Pt will demo improved ease and speed with sit to stand transfers and also have reduced pain. Baseline:  Goal status: Achieved 09/12/21  4.  Pt will report at least a 25% improvement in stability t/o R LE with ambulation.  Baseline: grabs shopping cart for support Goal status: Ongoing   LONG TERM GOALS: Target date: 10/17/2021  Pt will be able to perform extended ambulation distance without significant pain and fatigue including grocery shopping. Baseline:  Goal status:    2.  Pt will report no R LE instability with community ambulation. Baseline:  Goal status:Ongoing   3.  Pt will demo improved bilat hip flexion strength and at least 4-/5 hip abduction strength for improved tolerance to  and performance of functional mobility.  Baseline:  Goal status: Ongoing  4.  Pt will be able to perform his daily transfers without significant pain and difficulty.  Baseline:  Goal status: Achieved -09/12/21  5.  Pt will be able to stand to cook without significant pain and difficulty.  Baseline:  Goal status: Ongoing     PLAN: PT FREQUENCY: 2x/week  PT DURATION: 5 weeks  PLANNED INTERVENTIONS: Therapeutic exercises, Therapeutic activity, Neuromuscular re-education, Balance training, Gait training, Patient/Family education, Joint mobilization, Stair training, Aquatic Therapy, Dry Needling, Electrical stimulation, Spinal mobilization, Cryotherapy, Moist heat, Taping, Manual therapy, and Re-evaluation.  PLAN FOR NEXT SESSION: Cont with aquatic therapy;create laminated HEP.   Kerin Perna, PTA 09/12/21 4:06 PM  PTA spoke with PT to let PT know that his cert had expired.  PT was informed that pt had difficulty with scheduling appointments and has received limited PT appointments.  Pt has only had an evaluation and 1 aquatic Rx due to scheduling issues and ongoing projects at his house.  Will recert pt for 5 more weeks.  Selinda Michaels III PT, DPT 09/13/21 2:47 PM     PHYSICAL THERAPY DISCHARGE SUMMARY  Visits from Start of Care: 3  Current functional level related to goals / functional outcomes: Unable to assess current functional status or goals due to pt not being present at discharge.    Remaining deficits: See above   Education / Equipment:    Patient was seen in PT from 07/30/21  to 09/12/21.  Pt cancelled his remaining PT appointments due to not being able to afford therapy.  Patient is being discharged due to financial reasons.  Selinda Michaels III PT, DPT 03/31/22 10:06 AM

## 2021-09-13 ENCOUNTER — Encounter (HOSPITAL_BASED_OUTPATIENT_CLINIC_OR_DEPARTMENT_OTHER): Payer: Self-pay | Admitting: Physical Therapy

## 2021-09-13 NOTE — Addendum Note (Signed)
Addended by: Marijo Sanes on: 09/13/2021 02:50 PM   Modules accepted: Orders

## 2021-10-03 ENCOUNTER — Other Ambulatory Visit: Payer: Self-pay

## 2021-10-03 DIAGNOSIS — Z23 Encounter for immunization: Secondary | ICD-10-CM | POA: Diagnosis not present

## 2021-10-04 ENCOUNTER — Ambulatory Visit (HOSPITAL_BASED_OUTPATIENT_CLINIC_OR_DEPARTMENT_OTHER): Payer: Self-pay | Admitting: Physical Therapy

## 2021-10-08 ENCOUNTER — Ambulatory Visit (HOSPITAL_BASED_OUTPATIENT_CLINIC_OR_DEPARTMENT_OTHER): Payer: Self-pay | Admitting: Physical Therapy

## 2021-10-10 ENCOUNTER — Ambulatory Visit (HOSPITAL_BASED_OUTPATIENT_CLINIC_OR_DEPARTMENT_OTHER): Payer: Self-pay | Admitting: Physical Therapy

## 2022-01-15 HISTORY — PX: BUNIONECTOMY: SHX129

## 2022-03-13 DIAGNOSIS — R972 Elevated prostate specific antigen [PSA]: Secondary | ICD-10-CM | POA: Diagnosis not present

## 2022-03-13 DIAGNOSIS — Z136 Encounter for screening for cardiovascular disorders: Secondary | ICD-10-CM | POA: Diagnosis not present

## 2022-03-13 DIAGNOSIS — E119 Type 2 diabetes mellitus without complications: Secondary | ICD-10-CM | POA: Diagnosis not present

## 2022-03-13 DIAGNOSIS — Z0000 Encounter for general adult medical examination without abnormal findings: Secondary | ICD-10-CM | POA: Diagnosis not present

## 2022-03-13 DIAGNOSIS — Z1159 Encounter for screening for other viral diseases: Secondary | ICD-10-CM | POA: Diagnosis not present

## 2022-03-13 DIAGNOSIS — Z125 Encounter for screening for malignant neoplasm of prostate: Secondary | ICD-10-CM | POA: Diagnosis not present

## 2022-03-13 DIAGNOSIS — I272 Pulmonary hypertension, unspecified: Secondary | ICD-10-CM | POA: Diagnosis not present

## 2022-03-13 DIAGNOSIS — F330 Major depressive disorder, recurrent, mild: Secondary | ICD-10-CM | POA: Diagnosis not present

## 2022-03-13 DIAGNOSIS — I10 Essential (primary) hypertension: Secondary | ICD-10-CM | POA: Diagnosis not present

## 2022-03-13 DIAGNOSIS — I700 Atherosclerosis of aorta: Secondary | ICD-10-CM | POA: Diagnosis not present

## 2022-03-13 DIAGNOSIS — Z23 Encounter for immunization: Secondary | ICD-10-CM | POA: Diagnosis not present

## 2022-03-13 DIAGNOSIS — E1169 Type 2 diabetes mellitus with other specified complication: Secondary | ICD-10-CM | POA: Diagnosis not present

## 2022-03-13 DIAGNOSIS — Z79899 Other long term (current) drug therapy: Secondary | ICD-10-CM | POA: Diagnosis not present

## 2022-03-14 ENCOUNTER — Other Ambulatory Visit: Payer: Self-pay | Admitting: Family Medicine

## 2022-03-14 DIAGNOSIS — Z136 Encounter for screening for cardiovascular disorders: Secondary | ICD-10-CM

## 2022-03-19 ENCOUNTER — Ambulatory Visit
Admission: RE | Admit: 2022-03-19 | Discharge: 2022-03-19 | Disposition: A | Discharge status: Home / Self Care | Payer: PPO | Source: Ambulatory Visit | Attending: Family Medicine | Admitting: Family Medicine

## 2022-03-19 DIAGNOSIS — Z87891 Personal history of nicotine dependence: Secondary | ICD-10-CM | POA: Diagnosis not present

## 2022-03-19 DIAGNOSIS — Z136 Encounter for screening for cardiovascular disorders: Secondary | ICD-10-CM

## 2022-03-31 DIAGNOSIS — R972 Elevated prostate specific antigen [PSA]: Secondary | ICD-10-CM | POA: Diagnosis not present

## 2022-04-14 ENCOUNTER — Other Ambulatory Visit: Payer: Self-pay | Admitting: Urology

## 2022-04-14 DIAGNOSIS — E291 Testicular hypofunction: Secondary | ICD-10-CM | POA: Diagnosis not present

## 2022-04-14 DIAGNOSIS — N3944 Nocturnal enuresis: Secondary | ICD-10-CM | POA: Diagnosis not present

## 2022-04-14 DIAGNOSIS — N403 Nodular prostate with lower urinary tract symptoms: Secondary | ICD-10-CM | POA: Diagnosis not present

## 2022-04-14 DIAGNOSIS — R351 Nocturia: Secondary | ICD-10-CM | POA: Diagnosis not present

## 2022-04-14 DIAGNOSIS — R972 Elevated prostate specific antigen [PSA]: Secondary | ICD-10-CM | POA: Diagnosis not present

## 2022-04-15 ENCOUNTER — Other Ambulatory Visit (HOSPITAL_COMMUNITY): Payer: Self-pay | Admitting: Urology

## 2022-04-15 DIAGNOSIS — N403 Nodular prostate with lower urinary tract symptoms: Secondary | ICD-10-CM

## 2022-04-15 DIAGNOSIS — R972 Elevated prostate specific antigen [PSA]: Secondary | ICD-10-CM

## 2022-04-28 ENCOUNTER — Ambulatory Visit (HOSPITAL_COMMUNITY)
Admission: RE | Admit: 2022-04-28 | Discharge: 2022-04-28 | Disposition: A | Discharge status: Home / Self Care | Payer: PPO | Source: Ambulatory Visit | Attending: Urology | Admitting: Urology

## 2022-04-28 DIAGNOSIS — N403 Nodular prostate with lower urinary tract symptoms: Secondary | ICD-10-CM | POA: Diagnosis not present

## 2022-04-28 DIAGNOSIS — R972 Elevated prostate specific antigen [PSA]: Secondary | ICD-10-CM | POA: Diagnosis not present

## 2022-04-28 DIAGNOSIS — N400 Enlarged prostate without lower urinary tract symptoms: Secondary | ICD-10-CM | POA: Diagnosis not present

## 2022-04-28 MED ORDER — GADOBUTROL 1 MMOL/ML IV SOLN
10.0000 mL | Freq: Once | INTRAVENOUS | Status: AC | PRN
  Administered 2022-04-28: 10 mL via INTRAVENOUS

## 2022-05-21 DIAGNOSIS — C61 Malignant neoplasm of prostate: Secondary | ICD-10-CM

## 2022-05-21 HISTORY — DX: Malignant neoplasm of prostate: C61

## 2022-05-30 DIAGNOSIS — C61 Malignant neoplasm of prostate: Secondary | ICD-10-CM | POA: Diagnosis not present

## 2022-05-30 DIAGNOSIS — N4289 Other specified disorders of prostate: Secondary | ICD-10-CM | POA: Diagnosis not present

## 2022-06-03 ENCOUNTER — Other Ambulatory Visit (HOSPITAL_COMMUNITY): Payer: Self-pay | Admitting: Urology

## 2022-06-03 DIAGNOSIS — C61 Malignant neoplasm of prostate: Secondary | ICD-10-CM

## 2022-06-12 DIAGNOSIS — E119 Type 2 diabetes mellitus without complications: Secondary | ICD-10-CM | POA: Diagnosis not present

## 2022-06-12 DIAGNOSIS — E1169 Type 2 diabetes mellitus with other specified complication: Secondary | ICD-10-CM | POA: Diagnosis not present

## 2022-06-12 DIAGNOSIS — I10 Essential (primary) hypertension: Secondary | ICD-10-CM | POA: Diagnosis not present

## 2022-06-12 DIAGNOSIS — C61 Malignant neoplasm of prostate: Secondary | ICD-10-CM | POA: Diagnosis not present

## 2022-06-12 DIAGNOSIS — Z79899 Other long term (current) drug therapy: Secondary | ICD-10-CM | POA: Diagnosis not present

## 2022-06-12 DIAGNOSIS — I700 Atherosclerosis of aorta: Secondary | ICD-10-CM | POA: Diagnosis not present

## 2022-06-12 DIAGNOSIS — G479 Sleep disorder, unspecified: Secondary | ICD-10-CM | POA: Diagnosis not present

## 2022-06-23 ENCOUNTER — Encounter (HOSPITAL_COMMUNITY)
Admission: RE | Admit: 2022-06-23 | Discharge: 2022-06-23 | Disposition: A | Discharge status: Home / Self Care | Payer: PPO | Source: Ambulatory Visit | Attending: Urology | Admitting: Urology

## 2022-06-23 DIAGNOSIS — C61 Malignant neoplasm of prostate: Secondary | ICD-10-CM | POA: Insufficient documentation

## 2022-06-23 MED ORDER — PIFLIFOLASTAT F 18 (PYLARIFY) INJECTION
9.0000 | Freq: Once | INTRAVENOUS | Status: AC
  Administered 2022-06-23: 9.5 via INTRAVENOUS

## 2022-07-04 DIAGNOSIS — R351 Nocturia: Secondary | ICD-10-CM | POA: Diagnosis not present

## 2022-07-04 DIAGNOSIS — C61 Malignant neoplasm of prostate: Secondary | ICD-10-CM | POA: Diagnosis not present

## 2022-07-04 DIAGNOSIS — N403 Nodular prostate with lower urinary tract symptoms: Secondary | ICD-10-CM | POA: Diagnosis not present

## 2022-07-04 DIAGNOSIS — R3912 Poor urinary stream: Secondary | ICD-10-CM | POA: Diagnosis not present

## 2022-07-16 DIAGNOSIS — Z5111 Encounter for antineoplastic chemotherapy: Secondary | ICD-10-CM | POA: Diagnosis not present

## 2022-07-16 DIAGNOSIS — C61 Malignant neoplasm of prostate: Secondary | ICD-10-CM | POA: Diagnosis not present

## 2022-07-18 DIAGNOSIS — C61 Malignant neoplasm of prostate: Secondary | ICD-10-CM | POA: Diagnosis not present

## 2022-07-28 NOTE — Progress Notes (Signed)
Radiation Oncology         [phone] ________________________________  Initial Outpatient Consultation - Conducted via Telephone due to current COVID-19 concerns for limiting patient exposure  Name: Sean Green MRN: [id number]  Date: 07/29/2022  DOB: [birth year]  CC:Mila Palmer, MD  Bjorn Pippin, MD   REFERRING PHYSICIAN: Bjorn Pippin, MD  DIAGNOSIS: 67 y.o. 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    ICD-10-CM   1. Malignant neoplasm of prostate (HCC)  C61       HISTORY OF PRESENT ILLNESS: Sean Green is a 67 y.o. male with a diagnosis of prostate cancer. He was previously seen at [facility] in 2011 for hypogonadism and ED, treated with TRT and in 2017 for renal and ureteral calculi with LUTS requiring an ESWL. More recently, he was noted to have an elevated PSA of 6.71 by his primary care physician, Dr. Paulino Rily. A repeat PSA on 03/31/22 remained elevated at 6.24 so, he was referred for evaluation in urology by Dr. Annabell Howells on 04/14/22,  digital rectal examination performed at that time showed left prostate lobe firmness. He underwent prostate MRI on 04/28/22 showing a 1.1 cm lesion with multiparametric signal abnormality within the medial left mid gland peripheral zone (PI-RADS 3) and moderate BPH (within limitation of degraded exam secondary to right hip arthroplasty). The patient proceeded to MRI fusion biopsy of the prostate on 05/30/22.  The prostate volume measured 82 cc.  Out of 15 core biopsies, 6 were positive.  The maximum Gleason score was 4+4, and this was seen in all three samples from the MRI ROI (with perineural invasion), as well as in the left mid, and left apex (two small foci). Additionally, a small focus of Gleason 4+3 was seen in the left base lateral.  He underwent staging PSMA PET scan on 06/23/22 showing focal PSMA radiotracer activity within the posterior left  prostate lobe as well as a single radiotracer-avid pelvic lymph node in the posterior left obturator space. Additionally, there was intense radiotracer activity associated with a very small left inguinal lymph node; highly suspicious for metastasis but no evidence of metastatic disease in any lymph nodes outside of the pelvis, or evidence of visceral or skeletal metastasis.  The patient reviewed the biopsy and imaging results with his urologist and he has kindly been referred today for discussion of potential radiation treatment options. He has already started [place] ADT on 07/16/22 and is scheduled to be seen in the Advanced Prostate Cancer clinic at [facility] in the near future, to discuss adding an androgen receptor blocker to the ADT for total androgen blockade. He is also scheduled for a consult with Dr. Mosetta Putt on 07/31/22 to discuss the potential role of additional systemic therapies now or in the future.  PREVIOUS RADIATION THERAPY: No  PAST MEDICAL HISTORY:  Past Medical History:  Diagnosis Date   Anxiety    Arthritis    "joints" (09/08/2013)   BPH (benign prostatic hyperplasia)    Complication of anesthesia    woke up during hip surgery   Coronary artery disease 2011   right coronary artery 20%   Dizziness    Elevated PSA    History of kidney stones    Hyperlipidemia    Hypertension    takes medication daily   Hypogonadism male    Leukocytosis, unspecified    Myocardial infarction [facility])    Neuromuscular disorder (HCC)  numbess/tingling in left hand intermittently   Nocturia associated with benign prostatic hypertrophy    Pericarditis 2011   Pneumonia X 1   PONV (postoperative nausea and vomiting)       PAST SURGICAL HISTORY: Past Surgical History:  Procedure Laterality Date   ANTERIOR CERVICAL DECOMP/DISCECTOMY FUSION  06/25/2011   Procedure: ANTERIOR CERVICAL DECOMPRESSION/DISCECTOMY FUSION 3 LEVELS;  Surgeon: Hewitt Shorts, MD;  Location: [facility]UR[facility] OR[facility];  Service:  Neurosurgery;  Laterality: N/A;  Cervical Four-Five Cervical Five-Six Cervical Six-Seven Anterior Cervical Decompression with Fusion plating and bonegraft   BACK SURGERY     CARDIAC CATHETERIZATION  02/20/2009   CARPAL TUNNEL RELEASE Right ~ 1991   JOINT REPLACEMENT     LUMBAR LAMINECTOMY Left 09/07/2013   L5-S1   LUMBAR LAMINECTOMY/DECOMPRESSION MICRODISCECTOMY Left 09/07/2013   Procedure: LAMINECTOMY L5 - S1 ON THE LEFT 1 LEVEL;  Surgeon: Venita Lick, MD;  Location: [facility] OR;  Service: Orthopedics;  Laterality: Left;   PROSTATE BIOPSY     SHOULDER ARTHROSCOPY W/ ROTATOR CUFF REPAIR Right 01/21/2000   TOTAL HIP ARTHROPLASTY Right 01/20/2006    FAMILY HISTORY:  Family History  Problem Relation Age of Onset   Anesthesia problems Neg Hx    Hypotension Neg Hx    Malignant hyperthermia Neg Hx    Pseudochol deficiency Neg Hx     SOCIAL HISTORY:  Social History   Socioeconomic History   Marital status: Divorced    Spouse name: Not on file   Number of children: Not on file   Years of education: Not on file   Highest education level: Not on file  Occupational History   Not on file  Tobacco Use   Smoking status: Former    Packs/day: 0.50    Years: 10.00    Additional pack years: 0.00    Total pack years: 5.00    Types: Cigarettes   Smokeless tobacco: Former   Tobacco comments:    "quit smoking in my 30's; quit chewing in my 40's"  Vaping Use   Vaping Use: Never used  Substance and Sexual Activity   Alcohol use: Not Currently    Comment: 09/08/2013 "might have 3 drinks/ year, if that"   Drug use: No   Sexual activity: Not Currently  Other Topics Concern   Not on file  Social History Narrative   Lives alone in a two story home.  Has 2 grown daughters.  Worked as a Engineer, maintenance (IT).  Currently not working.  Education: one year of college.   Social Determinants of Health   Financial Resource Strain: Not on file  Food Insecurity: No Food Insecurity (07/29/2022)   Hunger  Vital Sign    Worried About Running Out of Food in the Last Year: Never true    Ran Out of Food in the Last Year: Never true  Transportation Needs: No Transportation Needs (07/29/2022)   PRAPARE - Administrator, Civil Service (Medical): No    Lack of Transportation (Non-Medical): No  Physical Activity: Not on file  Stress: Not on file  Social Connections: Not on file  Intimate Partner Violence: Not At Risk (07/29/2022)   Humiliation, Afraid, Rape, and Kick questionnaire    Fear of Current or Ex-Partner: No    Emotionally Abused: No    Physically Abused: No    Sexually Abused: No    ALLERGIES: Other, Starch, Adhesive [tape], and Latex  MEDICATIONS:  Current Outpatient Medications  Medication Sig Dispense Refill   ALPRAZolam (XANAX) 0.25  MG tablet Take 0.25 mg by mouth at bedtime as needed.     amLODipine (NORVASC) 10 MG tablet Take 10 mg by mouth daily.     ascorbic acid (VITAMIN C) 500 MG tablet Take 500 mg by mouth daily.     aspirin EC 81 MG tablet Take 81 mg by mouth daily.     cholecalciferol (VITAMIN D3) 25 MCG (1000 UNIT) tablet Take 1,000 Units by mouth daily.     citalopram (CELEXA) 40 MG tablet Take 40 mg by mouth daily.       Cyanocobalamin 1000 MCG CAPS Take by mouth daily.     lisinopril-hydrochlorothiazide (ZESTORETIC) 20-12.5 MG tablet Take 2 tablets by mouth daily.     melatonin 3 MG TABS tablet Take by mouth.     Olopatadine HCl 0.2 % SOLN Place 1 drop into both eyes daily as needed (for allergies).      rosuvastatin (CRESTOR) 5 MG tablet Take 5 mg by mouth daily.     No current facility-administered medications for this encounter.    REVIEW OF SYSTEMS:  On review of systems, the patient reports that he is doing well overall. He denies any chest pain, shortness of breath, cough, fevers, chills, night sweats, unintended weight changes. He denies any bowel disturbances, and denies abdominal pain, nausea or vomiting. He denies any new musculoskeletal or  joint aches or pains. His IPSS was 13, indicating moderate urinary symptoms with nocturia x5/night, frequency, intermittency and feelings of incomplete bladder emptying. His SHIM was 4, indicating he has severe erectile dysfunction. A complete review of systems is obtained and is otherwise negative.    PHYSICAL EXAM:  Wt Readings from Last 3 Encounters:  11/22/20 282 lb (127.9 kg)  09/20/20 282 lb (127.9 kg)  09/23/19 270 lb (122.5 kg)   Temp Readings from Last 3 Encounters:  09/23/19 98.4 F (36.9 C) (Oral)  12/17/15 97.8 F (36.6 C) (Oral)  09/08/13 98.4 F (36.9 C) (Oral)   BP Readings from Last 3 Encounters:  09/20/20 126/90  09/23/19 107/75  12/17/15 127/80   Pulse Readings from Last 3 Encounters:  09/20/20 91  09/23/19 95  12/17/15 84   Pain Assessment Pain Score: 0-No pain/10  Physical exam not performed in light of telephone consult visit format.   KPS = 100  100 - Normal; no complaints; no evidence of disease. 90   - Able to carry on normal activity; minor signs or symptoms of disease. 80   - Normal activity with effort; some signs or symptoms of disease. 106   - Cares for self; unable to carry on normal activity or to do active work. 60   - Requires occasional assistance, but is able to care for most of his personal needs. 50   - Requires considerable assistance and frequent medical care. 40   - Disabled; requires special care and assistance. 30   - Severely disabled; hospital admission is indicated although death not imminent. 20   - Very sick; hospital admission necessary; active supportive treatment necessary. 10   - Moribund; fatal processes progressing rapidly. 0     - Dead  Karnofsky DA, Abelmann WH, Craver LS and Burchenal [facility] [phone]) The use of the nitrogen mustards in the palliative treatment of carcinoma: with particular reference to bronchogenic carcinoma Cancer 1 634-56  LABORATORY DATA:  Lab Results  Component Value Date   WBC 6.7 09/23/2019    HGB 14.8 09/23/2019   HCT 44.6 09/23/2019   MCV 88.7 09/23/2019  PLT 234 09/23/2019   Lab Results  Component Value Date   NA 133 (L) 09/23/2019   K 2.7 (LL) 09/23/2019   CL 91 (L) 09/23/2019   CO2 29 09/23/2019   Lab Results  Component Value Date   ALT 31 06/18/2011   AST 17 06/18/2011   ALKPHOS 55 06/18/2011   BILITOT 1.2 06/18/2011     RADIOGRAPHY: No results found.    IMPRESSION/PLAN: This visit was conducted via Telephone to spare the patient unnecessary potential exposure in the healthcare setting during the current COVID-19 pandemic. 1. 67 y.o. 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 We discussed the patient's workup and outlined the nature of prostate cancer in this setting. The patient's T stage, Gleason's score, and PSA put him into the high risk group and he has radiographic evidence of locally advanced disease on recent PSMA PET imaging. Accordingly, he is eligible for a variety of potential treatment options including prostatectomy, or LT-ADT in combination with either 8 weeks of external radiation or 5 weeks of external radiation preceded by a brachytherapy boost. We discussed the available radiation techniques, and focused on the details and logistics and delivery. The patient is not an ideal candidate for brachytherapy boost with a prostate volume of 82 cc. Therefore, we discussed and outlined the risks, benefits, short and long-term effects associated with daily external beam radiotherapy and compared and contrasted this with prostatectomy. We discussed the role of SpaceOAR in reducing the rectal toxicity associated with radiotherapy. We also detailed the role of ADT in the treatment of high risk prostate cancer and outlined the associated side effects that could be expected with this therapy. He appears to have a good understanding of his disease and our treatment  recommendations which are of curative intent.  He was encouraged to ask questions that were answered to his stated satisfaction.  At the end of the conversation the patient is interested in moving forward with 8 weeks of external beam therapy concurrent with LT-ADT. He has already started [place] ADT on 07/16/22 and is scheduled to be seen in the Advanced Prostate Cancer clinic at [facility] in the near future, to discuss adding an androgen receptor blocker to the ADT for total androgen blockade. He is also scheduled for a consult with Dr. Mosetta Putt on 07/31/22 to discuss the potential role of additional systemic therapies now or in the future. We will share our discussion with Dr. Annabell Howells and make arrangements for fiducial markers and SpaceOAR gel placement in August 2024, prior to simulation, to reduce rectal toxicity from radiotherapy. The patient appears to have a good understanding of his disease and our treatment recommendations which are of curative intent and is in agreement with the stated plan.  We did discuss that while there is a low probability that we will cure the prostate cancer, there is a very high likelihood that we can get good, durable control of his disease with the recommended treatment. Therefore, we will move forward with treatment planning accordingly, in anticipation of beginning IMRT approximately 2 months after starting ADT. We enjoyed meeting him today and look forward to continuing to participate in his care. He knows that he is welcome to call at any time with questions or concerns in the interim.  Given current concerns for patient exposure during the COVID-19 pandemic, this encounter was conducted via telephone. The patient was notified in advance and was offered a  MyChart meeting to allow for face to face communication but unfortunately reported that he did not have the appropriate resources/technology to support such a visit and instead preferred to proceed with telephone consult. The  patient has given verbal consent for this type of encounter. The attendants for this meeting include Margaretmary Dys MD, Ashlyn Bruning PA-C, and patient, VINEET KINNEY During the encounter, Margaretmary Dys MD and Marcello Fennel PA-C were located at [facility] Radiation Oncology Department.  Patient, KENDEL BESSEY was located at home.  We personally spent 70 minutes in this encounter including chart review, reviewing radiological studies, meeting face-to-face with the patient, entering orders and completing documentation.   Marguarite Arbour, PA-C    Margaretmary Dys, MD  [facility]  Radiation Oncology Direct Dial: [phone]  Fax: [phone] [email or web address]  Skype  LinkedIn   This document serves as a record of services personally performed by Margaretmary Dys, MD and Marcello Fennel, PA-C. It was created on their behalf by Mickie Bail, a trained medical scribe. The creation of this record is based on the scribe's personal observations and the provider's statements to them. This document has been checked and approved by the attending provider.

## 2022-07-28 NOTE — Progress Notes (Signed)
GU Location of Tumor / Histology: Prostate Ca  If Prostate Cancer, Gleason Score is (4 + 4) and PSA is (6.24 04/14/2022)  Biopsies       06/23/2022 Dr. Bjorn Pippin NM PET (PSMA) Skull to Mid Thigh CLINICAL DATA: Prostate carcinoma with biochemical recurrence.   IMPRESSION: 1. Focal PSMA radiotracer activity within the posterior LEFT lobe of the prostate gland consistent with primary prostate adenocarcinoma. 2. Single radiotracer avid pelvic lymph node in the posterior LEFT operator space is most consistent with prostate cancer nodal metastasis 3. Intense radiotracer activity associated with a very small LEFT inguinal lymph node. While a typical site for prostate cancer nodal metastasis, there is a high suspicion for metastatic disease due to the intense activity for small size. 4. No evidence of metastatic disease in lymph nodes outside the pelvis. 5. No visceral metastasis or skeletal metastasis   04/28/2022 Dr. Bjorn Pippin MR Prostate with/without Contrast CLINICAL DATA:  Elevated PSA. Nodular prostate with lower urinary tract symptoms. PSA of 2.6. No prior biopsy.  IMPRESSION: 1. Mild to moderately degraded exam secondary to susceptibility artifact from right hip arthroplasty. 2. Multiparametric signal abnormality within the medial left mid gland peripheral zone of 1.1 cm. Considered PI-RADS(v2.1)-3. 3. Moderate benign prostatic hyperplasia. 4.  (I have post-processed this exam in the DynaCAD application for potential fusion-guided biopsy.)  Past/Anticipated interventions by urology, if any: NA  Past/Anticipated interventions by medical oncology, if any: NA  Weight changes, if any: {:18581}  IPSS: SHIM:  Bowel/Bladder complaints, if any: {:18581}   Nausea/Vomiting, if any: {:18581}  Pain issues, if any:  {:18581}  SAFETY ISSUES: Prior radiation? {:18581} Pacemaker/ICD? {:18581} Possible current pregnancy? Male Is the patient on methotrexate? No  Current  Complaints / other details:

## 2022-07-29 ENCOUNTER — Ambulatory Visit
Admission: RE | Admit: 2022-07-29 | Discharge: 2022-07-29 | Disposition: A | Discharge status: Home / Self Care | Payer: PPO | Source: Ambulatory Visit | Attending: Radiation Oncology | Admitting: Radiation Oncology

## 2022-07-29 ENCOUNTER — Encounter: Payer: Self-pay | Admitting: Radiation Oncology

## 2022-07-29 ENCOUNTER — Other Ambulatory Visit: Payer: Self-pay

## 2022-07-29 ENCOUNTER — Encounter: Payer: Self-pay | Admitting: Urology

## 2022-07-29 DIAGNOSIS — C61 Malignant neoplasm of prostate: Secondary | ICD-10-CM | POA: Diagnosis not present

## 2022-07-29 DIAGNOSIS — Z191 Hormone sensitive malignancy status: Secondary | ICD-10-CM | POA: Diagnosis not present

## 2022-07-29 HISTORY — DX: Hyperlipidemia, unspecified: E78.5

## 2022-07-31 ENCOUNTER — Inpatient Hospital Stay: Payer: PPO

## 2022-07-31 ENCOUNTER — Other Ambulatory Visit (HOSPITAL_COMMUNITY): Payer: Self-pay

## 2022-07-31 ENCOUNTER — Encounter: Payer: Self-pay | Admitting: Hematology

## 2022-07-31 ENCOUNTER — Inpatient Hospital Stay: Payer: PPO | Attending: Hematology | Admitting: Hematology

## 2022-07-31 ENCOUNTER — Other Ambulatory Visit: Payer: Self-pay

## 2022-07-31 VITALS — BP 135/82 | HR 88 | Temp 98.5°F | Resp 18 | Ht 74.0 in | Wt 295.6 lb

## 2022-07-31 DIAGNOSIS — C61 Malignant neoplasm of prostate: Secondary | ICD-10-CM | POA: Insufficient documentation

## 2022-07-31 DIAGNOSIS — Z7952 Long term (current) use of systemic steroids: Secondary | ICD-10-CM | POA: Diagnosis not present

## 2022-07-31 DIAGNOSIS — Z87891 Personal history of nicotine dependence: Secondary | ICD-10-CM | POA: Diagnosis not present

## 2022-07-31 DIAGNOSIS — C775 Secondary and unspecified malignant neoplasm of intrapelvic lymph nodes: Secondary | ICD-10-CM | POA: Diagnosis not present

## 2022-07-31 LAB — COMPREHENSIVE METABOLIC PANEL
ALT: 43 U/L (ref 0–44)
AST: 25 U/L (ref 15–41)
Albumin: 4.2 g/dL (ref 3.5–5.0)
Alkaline Phosphatase: 63 U/L (ref 38–126)
Anion gap: 7 (ref 5–15)
BUN: 18 mg/dL (ref 8–23)
CO2: 27 mmol/L (ref 22–32)
Calcium: 8.8 mg/dL — ABNORMAL LOW (ref 8.9–10.3)
Chloride: 101 mmol/L (ref 98–111)
Creatinine, Ser: 1.06 mg/dL (ref 0.61–1.24)
GFR, Estimated: >60 mL/min (ref >60)
Glucose, Bld: 96 mg/dL (ref 70–99)
Potassium: 3.6 mmol/L (ref 3.5–5.1)
Sodium: 135 mmol/L (ref 135–145)
Total Bilirubin: 0.9 mg/dL (ref 0.3–1.2)
Total Protein: 7.8 g/dL (ref 6.5–8.1)

## 2022-07-31 LAB — CBC WITH DIFFERENTIAL/PLATELET
Abs Immature Granulocytes: 0.06 10*3/uL (ref 0.00–0.07)
Basophils Absolute: 0 10*3/uL (ref 0.0–0.1)
Basophils Relative: 0 %
Eosinophils Absolute: 0.3 10*3/uL (ref 0.0–0.5)
Eosinophils Relative: 3 %
HCT: 48.5 % (ref 39.0–52.0)
Hemoglobin: 16.2 g/dL (ref 13.0–17.0)
Immature Granulocytes: 1 %
Lymphocytes Relative: 18 %
Lymphs Abs: 1.9 10*3/uL (ref 0.7–4.0)
MCH: 29.9 pg (ref 26.0–34.0)
MCHC: 33.4 g/dL (ref 30.0–36.0)
MCV: 89.6 fL (ref 80.0–100.0)
Monocytes Absolute: 0.8 10*3/uL (ref 0.1–1.0)
Monocytes Relative: 7 %
Neutro Abs: 7.7 10*3/uL (ref 1.7–7.7)
Neutrophils Relative %: 71 %
Platelets: 304 10*3/uL (ref 150–400)
RBC: 5.41 MIL/uL (ref 4.22–5.81)
RDW: 13.3 % (ref 11.5–15.5)
WBC: 10.8 10*3/uL — ABNORMAL HIGH (ref 4.0–10.5)
nRBC: 0 % (ref 0.0–0.2)

## 2022-07-31 MED ORDER — PREDNISONE 5 MG PO TABS
5.0000 mg | ORAL_TABLET | Freq: Every day | ORAL | 5 refills | Status: AC
  Filled 2022-07-31 – 2022-08-01 (×2): qty 30, 30d supply, fill #0
  Filled 2022-08-22: qty 30, 30d supply, fill #1
  Filled 2022-09-24 (×2): qty 30, 30d supply, fill #2

## 2022-07-31 MED ORDER — ABIRATERONE ACETATE 250 MG PO TABS
1000.0000 mg | ORAL_TABLET | Freq: Every day | ORAL | 0 refills | Status: DC
  Filled 2022-07-31 – 2022-08-01 (×2): qty 120, 30d supply, fill #0

## 2022-07-31 NOTE — Progress Notes (Signed)
[facility]   [phone] Fax:([phone]   Clinic New Consult Note   Patient Care Team: Mila Palmer, MD as PCP - General (Family Medicine) Christell Constant, MD as PCP - Cardiology (Cardiology) Shirlean Kelly, MD (Neurosurgery)  Date of Service:  07/31/2022   CHIEF COMPLAINTS/PURPOSE OF CONSULTATION:  Prostate Cancer  REFERRING PHYSICIAN:  Bjorn Pippin, MD  ASSESSMENT & PLAN:  Sean Green is a 67 y.o.  male with a history of   1. Malignant neoplasm of prostate (HCC), cT2N0M0, stage IV with pelvic node metastasis, castration sensitive, PSA 6.7, Gleason score 4+4=8 -He presented with screening discovered prostate cancer in May 2024. -Prostate biopsy showed prostatic adenocarcinoma in 4 out of 13 core biopsies, with Gleason score 8 in 3 cores, and 7 in 1 core -He is asymptomatic -I personally reviewed his PSMA scan images, and discussed findings with patient.  It showed hypermetabolic prostate, and two hypermetabolic nodes in left pelvis and left  groin, no other node or distant metastasis. -Given the low volume and low risk disease, I recommend androgen deprivation therapy, which he has started with Deborra Medina, and antitestosterone therapy.  I discussed option of abiraterone, enzalutamide etc. I recommend Zytiga 1000 mg daily and prednisone 5 mg daily.  Potential benefit of Zytiga and side effects, which includes but not limited to, fatigue, hypertension, leg edema, nausea, abnormal liver functions, cardiac failure, hyperkalemia, etc., were discussed with patient in detail, he agrees to proceed. -I agree with Dr. Kathrynn Running that he would benefit from radiation to the prostate and pelvic regional adenopathy, he will probably start in late August 2024 -We discussed the duration of Zytiga, if he has excellent response with undetectable PSA for 2 to 3 years, we can potentially stop in 2 to 3 years, and continue ADT indefinitely. -We also discussed other  prostate cancer treatment, including chemotherapy, Pluvicto, targeted therapy if he has certain mutations in the tumor or somatic mutation (Dr. Annabell Howells has requested genetic testing and [facility] on biopsy).  Given the low volume disease, no visceral metastasis, I do not think he needs chemotherapy now. -I will coordinate his care with Dr. Annabell Howells, he will likely continue ADT with Dr. Annabell Howells.    PLAN:  -reviewed Biopsy results and PSMA scan findings with pt -Flu with Dr. Annabell Howells for ADT -discuss Zytiga with Prednisone and its side effects, I will call in today and he will start when he receives it  - Will monitor CMP and PSA -start radiation 08/2022 by Dr. Kathrynn Running  -lab and f/u in 1 month   Oncology History Overview Note   Cancer Staging  Malignant neoplasm of prostate [facility]) Staging form: Prostate, AJCC 8th Edition - Clinical stage from 05/30/2022: Stage IVA (cT2b, cN1, cM0, PSA: 6.2, Grade Group: 4) - Signed by Marcello Fennel, PA-C on 07/29/2022 Histopathologic type: Adenocarcinoma, NOS Stage prefix: Initial diagnosis Prostate specific antigen (PSA) range: Less than 10 Gleason primary pattern: 4 Gleason secondary pattern: 4 Gleason score: 8 Histologic grading system: 5 grade system Number of biopsy cores examined: 15 Number of biopsy cores positive: 6 Location of positive needle core biopsies: One side     Malignant neoplasm of prostate (HCC)  05/30/2022 Cancer Staging   Staging form: Prostate, AJCC 8th Edition - Clinical stage from 05/30/2022: Stage IVA (cT2b, cN1, cM0, PSA: 6.2, Grade Group: 4) - Signed by Marcello Fennel, PA-C on 07/29/2022 Histopathologic type: Adenocarcinoma, NOS Stage prefix: Initial diagnosis Prostate specific antigen (PSA) range: Less than 10 Gleason primary  pattern: 4 Gleason secondary pattern: 4 Gleason score: 8 Histologic grading system: 5 grade system Number of biopsy cores examined: 15 Number of biopsy cores positive: 6 Location of positive needle  core biopsies: One side   06/12/2022 Pathology Results   Prostate  Adenocarcinoma 4 of 13 Gleason Score 4+4=8 (3) Gleason Score 4+3=7 (1)   06/23/2022 PET scan    IMPRESSION: 1. Focal PSMA radiotracer activity within the posterior LEFT lobe of the prostate gland consistent with primary prostate adenocarcinoma. 2. Single radiotracer avid pelvic lymph node in the posterior LEFT operator space is most consistent with prostate cancer nodal metastasis 3. Intense radiotracer activity associated with a very small LEFT inguinal lymph node. While a typical site for prostate cancer nodal metastasis, there is a high suspicion for metastatic disease due to the intense activity for small size. 4. No evidence of metastatic disease in lymph nodes outside the pelvis. 5. No visceral metastasis or skeletal metastasis     07/29/2022 Initial Diagnosis   Malignant neoplasm of prostate (HCC)      HISTORY OF PRESENTING ILLNESS:  WHYATT Green 67 y.o. male is a here because of Prostate Cancer . The patient was referred by his urologist Dr. Annabell Howells, The patient presents to the clinic today accompanied by  Significant other.  His prostate cancer was discovered by screening PSA.  His PSA was normal 2.81 in 2022.  Screening PSA went up to 6.71 in February 2024, and.  In March 31, 2022 was 6.24.  He has longstanding history of nocturia and frequency, no worse lately.  He has occasional low back pain and mild discomfort in pelvic area, but otherwise asymptomatic, has normal appetite and energy level.  He was referred to urologist Dr. Annabell Howells, and underwent prostate biopsy on Jun 13, 2022.  13 core biopsy showed prostatic adenocarcinoma in 4 cores, with Gleason score 8 in 3, and 7 in 1 core.  PSMA scan showed hypermetabolic prostate, and hypermetabolic left pelvic lymph node and left inguinal lymph node, no other distant metastasis.  Patient was started on Firmagon injection by Dr. Annabell Howells a few weeks ago and saw radiation  oncologist Dr. Kathrynn Running 2 days ago, plan for radiation in late August.   He has a PMHx of.... Anxiety Arthritis Myocardial Infraction Nerve/muscle disease Hypertension  Back Surgery Hip surgery  Socially... Divorce 2 children Former smoker retired REVIEW OF SYSTEMS:    Constitutional:(-) Denies fevers, chills or abnormal night sweats Eyes: (-) Denies blurriness of vision, double vision or watery eyes Ears, nose, mouth, throat, and face: Denies mucositis or sore throat Respiratory: (-)Denies cough, dyspnea or wheezes Cardiovascular:(-) Denies palpitation, chest discomfort or lower extremity swelling Gastrointestinal:(-)  Denies nausea, heartburn or change in bowel habits Skin: (-)Denies abnormal skin rashes Lymphatics:(-) Denies new lymphadenopathy or easy bruising Neurological:(-) Denies numbness, tingling or new weaknesses Behavioral/Psych: (-) Mood is stable, no new changes  All other systems were reviewed with the patient and are negative.   MEDICAL HISTORY:  Past Medical History:  Diagnosis Date   Anxiety    Arthritis    "joints" (09/08/2013)   BPH (benign prostatic hyperplasia)    Complication of anesthesia    woke up during hip surgery   Coronary artery disease 2011   right coronary artery 20%   Dizziness    Elevated PSA    History of kidney stones    Hyperlipidemia    Hypertension    takes medication daily   Hypogonadism male    Leukocytosis, unspecified  Myocardial infarction [facility])    Neuromuscular disorder (HCC)    numbess/tingling in left hand intermittently   Nocturia associated with benign prostatic hypertrophy    Pericarditis 2011   Pneumonia X 1   PONV (postoperative nausea and vomiting)     SURGICAL HISTORY: Past Surgical History:  Procedure Laterality Date   ANTERIOR CERVICAL DECOMP/DISCECTOMY FUSION  06/25/2011   Procedure: ANTERIOR CERVICAL DECOMPRESSION/DISCECTOMY FUSION 3 LEVELS;  Surgeon: Hewitt Shorts, MD;  Location: [facility] NEURO  ORS;  Service: Neurosurgery;  Laterality: N/A;  Cervical Four-Five Cervical Five-Six Cervical Six-Seven Anterior Cervical Decompression with Fusion plating and bonegraft   BACK SURGERY     CARDIAC CATHETERIZATION  02/20/2009   CARPAL TUNNEL RELEASE Right ~ 1991   JOINT REPLACEMENT     LUMBAR LAMINECTOMY Left 09/07/2013   L5-S1   LUMBAR LAMINECTOMY/DECOMPRESSION MICRODISCECTOMY Left 09/07/2013   Procedure: LAMINECTOMY L5 - S1 ON THE LEFT 1 LEVEL;  Surgeon: Venita Lick, MD;  Location: [facility] OR;  Service: Orthopedics;  Laterality: Left;   PROSTATE BIOPSY     SHOULDER ARTHROSCOPY W/ ROTATOR CUFF REPAIR Right 01/21/2000   TOTAL HIP ARTHROPLASTY Right 01/20/2006    SOCIAL HISTORY: Social History   Socioeconomic History   Marital status: Divorced    Spouse name: Not on file   Number of children: Not on file   Years of education: Not on file   Highest education level: Not on file  Occupational History   Not on file  Tobacco Use   Smoking status: Former    Current packs/day: 0.50    Average packs/day: 0.5 packs/day for 10.0 years (5.0 ttl pk-yrs)    Types: Cigarettes   Smokeless tobacco: Former   Tobacco comments:    "quit smoking in my 30's; quit chewing in my 40's"  Vaping Use   Vaping status: Never Used  Substance and Sexual Activity   Alcohol use: Not Currently    Comment: 09/08/2013 "might have 3 drinks/ year, if that"   Drug use: No   Sexual activity: Not Currently  Other Topics Concern   Not on file  Social History Narrative   Lives alone in a two story home.  Has 2 grown daughters.  Worked as a Engineer, maintenance (IT).  Currently not working.  Education: one year of college.   Social Determinants of Health   Financial Resource Strain: Not on file  Food Insecurity: No Food Insecurity (07/29/2022)   Hunger Vital Sign    Worried About Running Out of Food in the Last Year: Never true    Ran Out of Food in the Last Year: Never true  Transportation Needs: No Transportation Needs  (07/29/2022)   PRAPARE - Administrator, Civil Service (Medical): No    Lack of Transportation (Non-Medical): No  Physical Activity: Not on file  Stress: Not on file  Social Connections: Unknown (06/04/2021)   Received from [facility]   Social Network    Social Network: Not on file  Intimate Partner Violence: Not At Risk (07/29/2022)   Humiliation, Afraid, Rape, and Kick questionnaire    Fear of Current or Ex-Partner: No    Emotionally Abused: No    Physically Abused: No    Sexually Abused: No    FAMILY HISTORY: Family History  Problem Relation Age of Onset   Anesthesia problems Neg Hx    Hypotension Neg Hx    Malignant hyperthermia Neg Hx    Pseudochol deficiency Neg Hx     ALLERGIES:  is allergic  to other, starch, adhesive [tape], and latex.  MEDICATIONS:  Current Outpatient Medications  Medication Sig Dispense Refill   abiraterone acetate (ZYTIGA) 250 MG tablet Take 4 tablets (1,000 mg total) by mouth daily. Take on an empty stomach 1 hour before or 2 hours after a meal 120 tablet 0   predniSONE (DELTASONE) 5 MG tablet Take 1 tablet (5 mg total) by mouth daily with breakfast. 30 tablet 5   amLODipine (NORVASC) 10 MG tablet Take 10 mg by mouth daily.     ascorbic acid (VITAMIN C) 500 MG tablet Take 500 mg by mouth daily.     aspirin EC 81 MG tablet Take 81 mg by mouth daily.     cholecalciferol (VITAMIN D3) 25 MCG (1000 UNIT) tablet Take 1,000 Units by mouth daily.     citalopram (CELEXA) 40 MG tablet Take 40 mg by mouth daily.       Cyanocobalamin 1000 MCG CAPS Take by mouth daily.     lisinopril-hydrochlorothiazide (ZESTORETIC) 20-12.5 MG tablet Take 2 tablets by mouth daily.     melatonin 3 MG TABS tablet Take by mouth.     Olopatadine HCl 0.2 % SOLN Place 1 drop into both eyes daily as needed (for allergies).      rosuvastatin (CRESTOR) 5 MG tablet Take 5 mg by mouth daily.     No current facility-administered medications for this visit.    PHYSICAL  EXAMINATION: ECOG PERFORMANCE STATUS: 0 - Asymptomatic  Vitals:   07/31/22 1506  BP: 135/82  Pulse: 88  Resp: 18  Temp: 98.5 F (36.9 C)  SpO2: 94%   Filed Weights   07/31/22 1506  Weight: 295 lb 9.6 oz (134.1 kg)     GENERAL:alert, no distress and comfortable SKIN: skin color normal, no rashes or significant lesions EYES: normal, Conjunctiva are pink and non-injected, sclera clear  NEURO: alert & oriented x 3 with fluent speech NECK: (-)supple, thyroid normal size, non-tender, without nodularity LYMPH:  (-) no palpable lymphadenopathy in the cervical, axillary , (-) inguinal LUNGS:(-) clear to auscultation and percussion with normal breathing effort HEART(-)  regular rate & rhythm and no murmurs and no lower extremity edema ABDOMEN:(-)abdomen soft,(-) non-tender and normal bowel sounds   LABORATORY DATA:  I have reviewed the data as listed    Latest Ref Rng & Units 07/31/2022    3:55 PM 09/23/2019    4:24 PM 09/05/2013    1:09 PM  CBC  WBC 4.0 - 10.5 K/uL 10.8  6.7  10.1   Hemoglobin 13.0 - 17.0 g/dL 65.7  84.6  96.2   Hematocrit 39.0 - 52.0 % 48.5  44.6  49.9   Platelets 150 - 400 K/uL 304  234  343        Latest Ref Rng & Units 07/31/2022    3:55 PM 09/23/2019    4:24 PM 03/19/2015    1:50 PM  CMP  Glucose 70 - 99 mg/dL 96  952  86   BUN 8 - 23 mg/dL 18  13  19    Creatinine 0.61 - 1.24 mg/dL 8.41  3.24  4.01   Sodium 135 - 145 mmol/L 135  133  142   Potassium 3.5 - 5.1 mmol/L 3.6  2.7  3.5   Chloride 98 - 111 mmol/L 101  91  103   CO2 22 - 32 mmol/L 27  29  29    Calcium 8.9 - 10.3 mg/dL 8.8  8.4  9.6   Total Protein 6.5 -  8.1 g/dL 7.8     Total Bilirubin 0.3 - 1.2 mg/dL 0.9     Alkaline Phos 38 - 126 U/L 63     AST 15 - 41 U/L 25     ALT 0 - 44 U/L 43        RADIOGRAPHIC STUDIES: I have personally reviewed the radiological images as listed and agreed with the findings in the report. No results found.   Orders Placed This Encounter  Procedures    Comprehensive metabolic panel    Standing Status:   Standing    Number of Occurrences:   50    Standing Expiration Date:   07/31/2023   CBC with Differential/Platelet    Standing Status:   Standing    Number of Occurrences:   50    Standing Expiration Date:   07/31/2023   Prostate-Specific AG, Serum    Standing Status:   Standing    Number of Occurrences:   20    Standing Expiration Date:   07/31/2023    All questions were answered. The patient knows to call the clinic with any problems, questions or concerns. The total time spent in the appointment was 50 minutes.     Malachy Mood, MD 07/31/2022 6:28 PM  I, Monica Martinez am acting as scribe for Malachy Mood, MD.   I have reviewed the above documentation for accuracy and completeness, and I agree with the above.

## 2022-08-01 ENCOUNTER — Telehealth: Payer: Self-pay | Admitting: Pharmacy Technician

## 2022-08-01 ENCOUNTER — Other Ambulatory Visit (HOSPITAL_COMMUNITY): Payer: Self-pay

## 2022-08-01 ENCOUNTER — Encounter: Payer: Self-pay | Admitting: Hematology

## 2022-08-01 ENCOUNTER — Telehealth: Payer: Self-pay | Admitting: Pharmacist

## 2022-08-01 ENCOUNTER — Other Ambulatory Visit: Payer: Self-pay

## 2022-08-01 NOTE — Telephone Encounter (Signed)
Oral Oncology Pharmacist Encounter  Received new prescription for Zytiga (abiraterone acetate) for the treatment of metastatic castration sensitive prostate cancer in conjunction with prednisone and ADT, planned duration until disease progression or unacceptable drug toxicity.  CBC w/ Diff and CMP from 07/31/22 assessed, no baseline dose adjustments required at this time. Prescription dose and frequency assessed for appropriateness.  Current medication list in Epic reviewed, DDIs with Zytiga identified: Category C drug-drug interaction between Zytiga and Crestor - Zytiga may increase muscle ADEs from Crestor when used together. There are no changes in therapy warranted at this time. Recommend monitoring patient for increased muscle pain while on both agents.   Evaluated chart and no patient barriers to medication adherence noted.   Patient agreement for treatment documented in MD note on 07/31/22  Prescription has been e-scribed to the [facility] for benefits analysis and approval.  Oral Oncology Clinic will continue to follow for insurance authorization, copayment issues, initial counseling and start date.  Lenord Carbo, PharmD, BCPS, [facility] Hematology/Oncology Clinical Pharmacist [facility] Oral Chemotherapy Navigation Clinics [phone] 08/01/2022 8:46 AM

## 2022-08-01 NOTE — Telephone Encounter (Signed)
Oral Oncology Patient Advocate Encounter   Received notification that prior authorization for abiraterone is required.   PA submitted on 08/01/22 Key [id number] Status is pending     Jinger Neighbors, CPhT-Adv Oncology Pharmacy Patient Advocate [facility] Direct Number: [phone]  Fax: [phone]

## 2022-08-01 NOTE — Telephone Encounter (Signed)
Oral Oncology Patient Advocate Encounter  Prior Authorization for abiraterone has been approved.    PA# [id number] Effective dates: 08/01/22 through 08/01/23  Patients co-pay is $100.    Jinger Neighbors, CPhT-Adv Oncology Pharmacy Patient Advocate [facility] Direct Number: [phone]  Fax: [phone]

## 2022-08-01 NOTE — Progress Notes (Signed)
Patient returned my call and states [facility] applied on his behalf for assistance and he was approved and Roosvelt Maser is covered.  He states he has no additional financial questions or concerns. Advised about the one-time $1000 [employer] and $200 Prostate grants and to contact me should he become interested in applying. He verbalized understanding.  He has my name and number for any additional financial questions or concerns.

## 2022-08-01 NOTE — Progress Notes (Signed)
Received referral from [facility] in pharmacy for [employer] for assistance with [employer].  Called patient and left voicemail with my contact name and number for him to return my call.

## 2022-08-01 NOTE — Telephone Encounter (Signed)
Oral Oncology Patient Advocate Encounter  Was notified that [facility] was successful in securing patient a $8,000 grant from [facility] to provide copayment coverage for abiraterone.  This will keep the out of pocket expense at $0.     [employer] ID: [id number]  I have spoken with the patient.   The billing information is as follows and has been shared with [facility].    RxBin: [id number] PCN: [id number] Member ID: [id number] Group ID: [id number] Dates of Eligibility: 06/28/22 through 06/27/23  Fund:  Prostate  Jinger Neighbors, CPhT-Adv Oncology Pharmacy Patient Advocate [facility] Direct Number: [phone]  Fax: [phone]

## 2022-08-01 NOTE — Telephone Encounter (Signed)
Oral Chemotherapy Pharmacist Encounter  I spoke with patient for overview of: Zytiga (abiraterone acetate) for the treatment of metastatic, castration-sensitive prostate cancer in conjunction with prednisone and ADT, planned duration until disease progression or unacceptable toxicity.   Counseled patient on administration, dosing, side effects, monitoring, drug-food interactions, safe handling, storage, and disposal.  Patient will take Zytiga 250mg  tablets, 4 tablets (1000mg ) by mouth once daily on an empty stomach, 1 hour before or 2 hours after a meal.  Patient will take prednisone 5mg  tablet, 1 tablet by mouth one daily with breakfast.  Zytiga start date: 08/06/22  Adverse effects include but are not limited to: peripheral edema, GI upset, hypertension, hot flashes, fatigue, and arthralgias.    Reviewed with patient importance of keeping a medication schedule and plan for any missed doses. No barriers to medication adherence identified.  Medication reconciliation performed and medication/allergy list updated.  All questions answered.  Sean Green voiced understanding and appreciation.   Medication education handout placed in mail for patient. Patient knows to call the office with questions or concerns. Oral Chemotherapy Clinic phone number provided to patient.   Lenord Carbo, PharmD, BCPS, [facility] Hematology/Oncology Clinical Pharmacist [facility] Oral Chemotherapy Navigation Clinics [phone] 08/01/2022 11:03 AM

## 2022-08-02 DIAGNOSIS — Z191 Hormone sensitive malignancy status: Secondary | ICD-10-CM | POA: Diagnosis not present

## 2022-08-02 DIAGNOSIS — C61 Malignant neoplasm of prostate: Secondary | ICD-10-CM | POA: Diagnosis not present

## 2022-08-02 LAB — PROSTATE-SPECIFIC AG, SERUM (LABCORP): Prostate Specific Ag, Serum: 4.9 ng/mL — ABNORMAL HIGH (ref 0.0–4.0)

## 2022-08-04 ENCOUNTER — Other Ambulatory Visit: Payer: Self-pay | Admitting: Urology

## 2022-08-04 DIAGNOSIS — C61 Malignant neoplasm of prostate: Secondary | ICD-10-CM | POA: Diagnosis not present

## 2022-08-05 NOTE — Progress Notes (Signed)
Spoke with patient via telephone to introduce myself as the prostate nurse navigator and discussed my role.  No barriers to care identified at this time.    Patient has previously start his ADT, Sean Green, and will start Zytiga on 7/17.  Patient will plan to follow up at [facility] at the [facility] on 7/31 as well. RN will review recommendations to ensure treatment plan remains as currently recommended.   Plan of care in progress.

## 2022-08-20 ENCOUNTER — Other Ambulatory Visit: Payer: Self-pay

## 2022-08-20 DIAGNOSIS — R3912 Poor urinary stream: Secondary | ICD-10-CM | POA: Diagnosis not present

## 2022-08-20 DIAGNOSIS — C61 Malignant neoplasm of prostate: Secondary | ICD-10-CM | POA: Diagnosis not present

## 2022-08-20 DIAGNOSIS — C778 Secondary and unspecified malignant neoplasm of lymph nodes of multiple regions: Secondary | ICD-10-CM | POA: Diagnosis not present

## 2022-08-21 NOTE — Progress Notes (Signed)
Patient saw APC at [facility] on 7/31 and was transitioned to Orgovyx for his ADT, and Tamsulosin 0.4 mg daily for his weak stream.     RN will reach out to patient prior to start of radiation to review any additional needs.   Plan of care in progress.

## 2022-08-22 ENCOUNTER — Other Ambulatory Visit: Payer: Self-pay

## 2022-08-22 ENCOUNTER — Other Ambulatory Visit: Payer: Self-pay | Admitting: Hematology

## 2022-08-22 MED ORDER — ABIRATERONE ACETATE 250 MG PO TABS
1000.0000 mg | ORAL_TABLET | Freq: Every day | ORAL | 0 refills | Status: DC
  Filled 2022-08-22: qty 120, 30d supply, fill #0

## 2022-08-29 ENCOUNTER — Other Ambulatory Visit (HOSPITAL_COMMUNITY): Payer: Self-pay

## 2022-08-31 NOTE — Assessment & Plan Note (Signed)
cT2N0M0, stage IV with pelvic node metastasis, castration sensitive, PSA 6.7, Gleason score 4+4=8 -He presented with screening discovered prostate cancer in May 2024. -Prostate biopsy showed prostatic adenocarcinoma in 4 out of 13 core biopsies, with Gleason score 8 in 3 cores, and 7 in 1 core -He is asymptomatic -He has started with Deborra Medina, and antitestosterone therapy.  I discussed option of abiraterone, enzalutamide etc. I recommend Zytiga 1000 mg daily and prednisone 5 mg daily. He started on 08/06/2022 -He is scheduled for prostate seed placement on September 17, 2022, for radiation therapy. -Will continue ADT at his urologist office, will continue Zytiga and prednisone.  He has moderate fatigue from treatment, I encouraged him to exercise.  If he has persistent fatigue, I can reduce Zytiga dose.  He will call me and let me know.

## 2022-09-01 ENCOUNTER — Encounter: Payer: Self-pay | Admitting: Hematology

## 2022-09-01 ENCOUNTER — Other Ambulatory Visit (HOSPITAL_COMMUNITY): Payer: Self-pay

## 2022-09-01 ENCOUNTER — Inpatient Hospital Stay (HOSPITAL_BASED_OUTPATIENT_CLINIC_OR_DEPARTMENT_OTHER): Payer: Medicare Other | Admitting: Hematology

## 2022-09-01 ENCOUNTER — Other Ambulatory Visit: Payer: Self-pay

## 2022-09-01 ENCOUNTER — Inpatient Hospital Stay: Payer: Medicare Other | Attending: Hematology

## 2022-09-01 VITALS — BP 132/76 | HR 85 | Temp 98.5°F | Resp 18 | Ht 74.0 in | Wt 290.7 lb

## 2022-09-01 DIAGNOSIS — C775 Secondary and unspecified malignant neoplasm of intrapelvic lymph nodes: Secondary | ICD-10-CM | POA: Diagnosis present

## 2022-09-01 DIAGNOSIS — Z79899 Other long term (current) drug therapy: Secondary | ICD-10-CM | POA: Insufficient documentation

## 2022-09-01 DIAGNOSIS — C61 Malignant neoplasm of prostate: Secondary | ICD-10-CM

## 2022-09-01 DIAGNOSIS — Z7982 Long term (current) use of aspirin: Secondary | ICD-10-CM | POA: Diagnosis not present

## 2022-09-01 LAB — COMPREHENSIVE METABOLIC PANEL
ALT: 27 U/L (ref 0–44)
AST: 18 U/L (ref 15–41)
Albumin: 4.2 g/dL (ref 3.5–5.0)
Alkaline Phosphatase: 68 U/L (ref 38–126)
Anion gap: 8 (ref 5–15)
BUN: 14 mg/dL (ref 8–23)
CO2: 34 mmol/L — ABNORMAL HIGH (ref 22–32)
Calcium: 9.2 mg/dL (ref 8.9–10.3)
Chloride: 99 mmol/L (ref 98–111)
Creatinine, Ser: 0.89 mg/dL (ref 0.61–1.24)
GFR, Estimated: >60 mL/min (ref >60)
Glucose, Bld: 171 mg/dL — ABNORMAL HIGH (ref 70–99)
Potassium: 3.4 mmol/L — ABNORMAL LOW (ref 3.5–5.1)
Sodium: 141 mmol/L (ref 135–145)
Total Bilirubin: 1.3 mg/dL — ABNORMAL HIGH (ref 0.3–1.2)
Total Protein: 7.2 g/dL (ref 6.5–8.1)

## 2022-09-01 LAB — CBC WITH DIFFERENTIAL/PLATELET
Abs Immature Granulocytes: 0.03 10*3/uL (ref 0.00–0.07)
Basophils Absolute: 0 10*3/uL (ref 0.0–0.1)
Basophils Relative: 0 %
Eosinophils Absolute: 0.2 10*3/uL (ref 0.0–0.5)
Eosinophils Relative: 2 %
HCT: 45.1 % (ref 39.0–52.0)
Hemoglobin: 15.2 g/dL (ref 13.0–17.0)
Immature Granulocytes: 0 %
Lymphocytes Relative: 12 %
Lymphs Abs: 1.2 10*3/uL (ref 0.7–4.0)
MCH: 29.9 pg (ref 26.0–34.0)
MCHC: 33.7 g/dL (ref 30.0–36.0)
MCV: 88.6 fL (ref 80.0–100.0)
Monocytes Absolute: 0.8 10*3/uL (ref 0.1–1.0)
Monocytes Relative: 8 %
Neutro Abs: 7.5 10*3/uL (ref 1.7–7.7)
Neutrophils Relative %: 78 %
Platelets: 309 10*3/uL (ref 150–400)
RBC: 5.09 MIL/uL (ref 4.22–5.81)
RDW: 13.2 % (ref 11.5–15.5)
WBC: 9.7 10*3/uL (ref 4.0–10.5)
nRBC: 0 % (ref 0.0–0.2)

## 2022-09-01 NOTE — Progress Notes (Signed)
[facility]   [phone] Fax:([phone]   Clinic Follow up Note   Patient Care Team: Mila Palmer, MD as PCP - General (Family Medicine) Christell Constant, MD as PCP - Cardiology (Cardiology) Shirlean Kelly, MD (Neurosurgery) Cherlyn Cushing, RN as Oncology Nurse Navigator  Date of Service:  09/01/2022  CHIEF COMPLAINT: f/u of Prostate Cancer   TREATMENT PLAN:  Zytiga with Prednisone   ASSESSMENT:  Sean Green is a 67 y.o. male with   Malignant neoplasm of prostate (HCC) cT2N0M0, stage IV with pelvic node metastasis, castration sensitive, PSA 6.7, Gleason score 4+4=8 -He presented with screening discovered prostate cancer in May 2024. -Prostate biopsy showed prostatic adenocarcinoma in 4 out of 13 core biopsies, with Gleason score 8 in 3 cores, and 7 in 1 core -He is asymptomatic -He has started with Deborra Medina, and antitestosterone therapy.  I discussed option of abiraterone, enzalutamide etc. I recommend Zytiga 1000 mg daily and prednisone 5 mg daily. He started on 08/06/2022 -He is scheduled for prostate seed placement on September 17, 2022, for radiation therapy. -Will continue ADT at his urologist office, will continue Zytiga and prednisone.  He has moderate fatigue from treatment, I encouraged him to exercise.  If he has persistent fatigue, I can reduce Zytiga dose.  He will call me and let me know.    PLAN: - encouraged to increase activity and exercise regularly to improve his energy - reviewed labs - reviewed med list for refills - follow up with Dr. Annabell Howells for Eligard injections - f/u and lab in 3 months -He will proceed to radiation treatment in the next month      SUMMARY OF ONCOLOGIC HISTORY: Oncology History Overview Note   Cancer Staging  Malignant neoplasm of prostate [facility]) Staging form: Prostate, AJCC 8th Edition - Clinical stage from 05/30/2022: Stage IVA (cT2b, cN1, cM0, PSA: 6.2, Grade Group: 4) - Signed by Marcello Fennel, PA-C on 07/29/2022 Histopathologic type: Adenocarcinoma, NOS Stage prefix: Initial diagnosis Prostate specific antigen (PSA) range: Less than 10 Gleason primary pattern: 4 Gleason secondary pattern: 4 Gleason score: 8 Histologic grading system: 5 grade system Number of biopsy cores examined: 15 Number of biopsy cores positive: 6 Location of positive needle core biopsies: One side     Malignant neoplasm of prostate (HCC)  05/30/2022 Cancer Staging   Staging form: Prostate, AJCC 8th Edition - Clinical stage from 05/30/2022: Stage IVA (cT2b, cN1, cM0, PSA: 6.2, Grade Group: 4) - Signed by Marcello Fennel, PA-C on 07/29/2022 Histopathologic type: Adenocarcinoma, NOS Stage prefix: Initial diagnosis Prostate specific antigen (PSA) range: Less than 10 Gleason primary pattern: 4 Gleason secondary pattern: 4 Gleason score: 8 Histologic grading system: 5 grade system Number of biopsy cores examined: 15 Number of biopsy cores positive: 6 Location of positive needle core biopsies: One side   06/12/2022 Pathology Results   Prostate  Adenocarcinoma 4 of 13 Gleason Score 4+4=8 (3) Gleason Score 4+3=7 (1)   06/23/2022 PET scan    IMPRESSION: 1. Focal PSMA radiotracer activity within the posterior LEFT lobe of the prostate gland consistent with primary prostate adenocarcinoma. 2. Single radiotracer avid pelvic lymph node in the posterior LEFT operator space is most consistent with prostate cancer nodal metastasis 3. Intense radiotracer activity associated with a very small LEFT inguinal lymph node. While a typical site for prostate cancer nodal metastasis, there is a high suspicion for metastatic disease due to the intense activity for small size. 4. No evidence of metastatic disease in  lymph nodes outside the pelvis. 5. No visceral metastasis or skeletal metastasis     07/29/2022 Initial Diagnosis   Malignant neoplasm of prostate [facility])      INTERVAL HISTORY:  Sean Green is  here for a follow up of prostate cancer. He was last seen by me on 07/31/2022. He presents to clinic today with his wife. He stated he is very tired unable to go out and do much, but he is able to function well at home.      All other systems were reviewed with the patient and are negative.  MEDICAL HISTORY:  Past Medical History:  Diagnosis Date   Anxiety    Arthritis    "joints" (09/08/2013)   BPH (benign prostatic hyperplasia)    Complication of anesthesia    woke up during hip surgery   Coronary artery disease 2011   right coronary artery 20%   Dizziness    Elevated PSA    History of kidney stones    Hyperlipidemia    Hypertension    takes medication daily   Hypogonadism male    Leukocytosis, unspecified    Myocardial infarction (HCC)    Neuromuscular disorder (HCC)    numbess/tingling in left hand intermittently   Nocturia associated with benign prostatic hypertrophy    Pericarditis 2011   Pneumonia X 1   PONV (postoperative nausea and vomiting)     SURGICAL HISTORY: Past Surgical History:  Procedure Laterality Date   ANTERIOR CERVICAL DECOMP/DISCECTOMY FUSION  06/25/2011   Procedure: ANTERIOR CERVICAL DECOMPRESSION/DISCECTOMY FUSION 3 LEVELS;  Surgeon: Hewitt Shorts, MD;  Location: [facility];  Service: Neurosurgery;  Laterality: N/A;  Cervical Four-Five Cervical Five-Six Cervical Six-Seven Anterior Cervical Decompression with Fusion plating and bonegraft   BACK SURGERY     CARDIAC CATHETERIZATION  02/20/2009   CARPAL TUNNEL RELEASE Right ~ 1991   JOINT REPLACEMENT     LUMBAR LAMINECTOMY Left 09/07/2013   L5-S1   LUMBAR LAMINECTOMY/DECOMPRESSION MICRODISCECTOMY Left 09/07/2013   Procedure: LAMINECTOMY L5 - S1 ON THE LEFT 1 LEVEL;  Surgeon: Venita Lick, MD;  Location: [facility] OR;  Service: Orthopedics;  Laterality: Left;   PROSTATE BIOPSY     SHOULDER ARTHROSCOPY W/ ROTATOR CUFF REPAIR Right 01/21/2000   TOTAL HIP ARTHROPLASTY Right 01/20/2006    I have  reviewed the social history and family history with the patient and they are unchanged from previous note.  ALLERGIES:  is allergic to other, starch, adhesive [tape], and latex.  MEDICATIONS:  Current Outpatient Medications  Medication Sig Dispense Refill   abiraterone acetate (ZYTIGA) 250 MG tablet Take 4 tablets (1,000 mg total) by mouth daily. Take on an empty stomach 1 hour before or 2 hours after a meal 120 tablet 0   amLODipine (NORVASC) 10 MG tablet Take 10 mg by mouth daily.     ascorbic acid (VITAMIN C) 500 MG tablet Take 1,000 mg by mouth daily.     aspirin EC 81 MG tablet Take 81 mg by mouth daily.     cholecalciferol (VITAMIN D3) 25 MCG (1000 UNIT) tablet Take 1,000 Units by mouth daily.     citalopram (CELEXA) 40 MG tablet Take 40 mg by mouth daily.       Cyanocobalamin 1000 MCG CAPS Take 2,500 mcg by mouth daily.     lisinopril-hydrochlorothiazide (ZESTORETIC) 20-12.5 MG tablet Take 2 tablets by mouth daily.     melatonin 3 MG TABS tablet Take by mouth.     Olopatadine HCl 0.2 %  SOLN Place 1 drop into both eyes daily as needed (for allergies).      predniSONE (DELTASONE) 5 MG tablet Take 1 tablet (5 mg total) by mouth daily with breakfast. 30 tablet 5   relugolix (ORGOVYX) 120 MG tablet Take 120 mg by mouth daily.     rosuvastatin (CRESTOR) 5 MG tablet Take 5 mg by mouth daily.     tamsulosin (FLOMAX) 0.4 MG CAPS capsule Take 0.4 mg by mouth daily.     zinc gluconate 50 MG tablet Take 50 mg by mouth daily.     No current facility-administered medications for this visit.    PHYSICAL EXAMINATION: ECOG PERFORMANCE STATUS: 1 - Symptomatic but completely ambulatory  Vitals:   09/01/22 1303  BP: 132/76  Pulse: 85  Resp: 18  Temp: 98.5 F (36.9 C)  SpO2: 95%   Wt Readings from Last 3 Encounters:  09/01/22 290 lb 11.2 oz (131.9 kg)  07/31/22 295 lb 9.6 oz (134.1 kg)  11/22/20 282 lb (127.9 kg)     GENERAL:alert, no distress and comfortable SKIN: skin color, texture,  turgor are normal, no rashes or significant lesions EYES: normal, Conjunctiva are pink and non-injected, sclera clear NECK: supple, thyroid normal size, non-tender, without nodularity LYMPH:  no palpable lymphadenopathy in the cervical, axillary  LUNGS: clear to auscultation and percussion with normal breathing effort HEART: regular rate & rhythm and no murmurs and no lower extremity edema ABDOMEN:abdomen soft, non-tender and normal bowel sounds Musculoskeletal:no cyanosis of digits and no clubbing  NEURO: alert & oriented x 3 with fluent speech, no focal motor/sensory deficits  LABORATORY DATA:  I have reviewed the data as listed    Latest Ref Rng & Units 09/01/2022   12:28 PM 07/31/2022    3:55 PM 09/23/2019    4:24 PM  CBC  WBC 4.0 - 10.5 K/uL 9.7  10.8  6.7   Hemoglobin 13.0 - 17.0 g/dL 32.4  40.1  02.7   Hematocrit 39.0 - 52.0 % 45.1  48.5  44.6   Platelets 150 - 400 K/uL 309  304  234         Latest Ref Rng & Units 09/01/2022   12:28 PM 07/31/2022    3:55 PM 09/23/2019    4:24 PM  CMP  Glucose 70 - 99 mg/dL 253  96  664   BUN 8 - 23 mg/dL 14  18  13    Creatinine 0.61 - 1.24 mg/dL 4.03  4.74  2.59   Sodium 135 - 145 mmol/L 141  135  133   Potassium 3.5 - 5.1 mmol/L 3.4  3.6  2.7   Chloride 98 - 111 mmol/L 99  101  91   CO2 22 - 32 mmol/L 34  27  29   Calcium 8.9 - 10.3 mg/dL 9.2  8.8  8.4   Total Protein 6.5 - 8.1 g/dL 7.2  7.8    Total Bilirubin 0.3 - 1.2 mg/dL 1.3  0.9    Alkaline Phos 38 - 126 U/L 68  63    AST 15 - 41 U/L 18  25    ALT 0 - 44 U/L 27  43        RADIOGRAPHIC STUDIES: I have personally reviewed the radiological images as listed and agreed with the findings in the report. No results found.    No orders of the defined types were placed in this encounter.  All questions were answered. The patient knows to call the clinic with any  problems, questions or concerns. No barriers to learning was detected. The total time spent in the appointment was 25  minutes.     Malachy Mood, MD 09/01/2022   I, Sharlette Dense, CMA, am acting as scribe for Malachy Mood, MD.   I have reviewed the above documentation for accuracy and completeness, and I agree with the above.

## 2022-09-02 ENCOUNTER — Encounter (HOSPITAL_BASED_OUTPATIENT_CLINIC_OR_DEPARTMENT_OTHER): Payer: Self-pay | Admitting: Urology

## 2022-09-04 ENCOUNTER — Encounter (HOSPITAL_BASED_OUTPATIENT_CLINIC_OR_DEPARTMENT_OTHER): Payer: Self-pay | Admitting: Urology

## 2022-09-05 ENCOUNTER — Encounter (HOSPITAL_BASED_OUTPATIENT_CLINIC_OR_DEPARTMENT_OTHER): Payer: Self-pay | Admitting: Urology

## 2022-09-05 NOTE — Progress Notes (Signed)
Pt would like to consolidate his care under one provider, Dr. Annabell Howells.   Patient with with high risk prostate cancer, currently receiving Orgovyx [facility]), and Zytiga/Prednison (Dr. Mosetta Putt).    Patient also scheduled for fiducial's and spaceOAR on 8/23, with radiation to follow. RN notified providers and will ensure he remains with appropriate follow up's.

## 2022-09-05 NOTE — Progress Notes (Signed)
Spoke w/ via phone for pre-op interview--- pt Lab needs dos----   [employer], ekg            Lab results------ no COVID test -----patient states asymptomatic no test needed Arrive at ------- 0530 on 09-12-2022 NPO after MN NO Solid Food.  Clear liquids from MN until--- 0430 Med rec completed Medications to take morning of surgery ----- norvasc (pt did not want to take anything else) Diabetic medication ----- pt takes metformin in the pm Patient instructed no nail polish to be worn day of surgery Patient instructed to bring photo id and insurance card day of surgery Patient aware to have Driver (ride ) / caregiver    for 24 hours after surgery -- sig other, debbie Patient Special Instructions ----- told pt to do one fleet enema morning of surgery, pt stated it's for him to reach around to do enema can he do something else.  Advised pt he would need to call/ ask dr Annabell Howells office . Pre-Op special Instructions ----- n/a Patient verbalized understanding of instructions that were given at this phone interview. Patient denies shortness of breath, chest pain, fever, cough at this phone interview.

## 2022-09-11 NOTE — H&P (Signed)
Subjective:   Sean Green is to undergo placement of SpaceOAR and fiducial markers for the treatment of prostate cancer as described below. He is on firmagon and abiaterone/pred for LT ADT.   HISTORY OF PRESENT ILLNESS: Sean Green is a 67 y.o. male with a diagnosis of prostate cancer. He was previously seen at [facility] in 2011 for hypogonadism and ED, treated with TRT and in 2017 for renal and ureteral calculi with LUTS requiring an ESWL. More recently, he was noted to have an elevated PSA of 6.71 by his primary care physician, Sean Green. A repeat PSA on 03/31/22 remained elevated at 6.24 so, he was referred for evaluation in urology by Sean Green on 04/14/22,  digital rectal examination performed at that time showed left prostate lobe firmness. He underwent prostate MRI on 04/28/22 showing a 1.1 cm lesion with multiparametric signal abnormality within the medial left mid gland peripheral zone (PI-RADS 3) and moderate BPH (within limitation of degraded exam secondary to right hip arthroplasty). The patient proceeded to MRI fusion biopsy of the prostate on 05/30/22.  The prostate volume measured 82 cc.  Out of 15 core biopsies, 6 were positive.  The maximum Gleason score was 4+4, and this was seen in all three samples from the MRI ROI (with perineural invasion), as well as in the left mid, and left apex (two small foci). Additionally, a small focus of Gleason 4+3 was seen in the left base lateral.   He underwent staging PSMA PET scan on 06/23/22 showing focal PSMA radiotracer activity within the posterior left prostate lobe as well as a single radiotracer-avid pelvic lymph node in the posterior left obturator space. Additionally, there was intense radiotracer activity associated with a very small left inguinal lymph node; highly suspicious for metastasis but no evidence of metastatic disease in any lymph nodes outside of the pelvis, or evidence of visceral or skeletal metastasis. ROS:  Review of Systems   Constitutional:  Positive for malaise/fatigue.  Musculoskeletal:  Positive for back pain and joint pain.    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 Izora Green;   cardiac cath done 02-27-201  for elevated cardiac enzymes/ ? ekg, dx viral pericarditis, per cardiac cath proxRCA 30% stenosis ;   nuclear stress test 11-23-2020  normal perfusion w/ no ischemia, nuclear ef 58%   ED (erectile dysfunction)    Family history of adverse reaction to anesthesia    sister--- ponv   GAD (generalized anxiety disorder)    History of kidney stones    History of viral pericarditis 03/18/2009   admission in epic;   in setting elevated cardiac enzymes/ chest pain/ ? ekg, s/p cath w/ nonob cad   Hyperlipidemia    Hypertension    Malignant neoplasm prostate [facility]) 05/2022   urologist--- Sean Green/  oncologist--- Sean Green/  radiation conologist--- Sean Green;   dx 05/ 2024,  Gleason 4+4,  Stage IV w/  pelvic node mets   MDD (major depressive disorder)    Nodular prostate with lower urinary tract  symptoms    OA (osteoarthritis)    Peripheral neuropathy    Peyronie's disease    PONV (postoperative nausea and vomiting)    Primary male hypogonadism    Type 2 diabetes mellitus (HCC)    followed by pcp   (09-05-2022  pt stated he does not check blood sugar at home)    Past Surgical History:  Procedure Laterality Date   ANTERIOR CERVICAL DECOMP/DISCECTOMY FUSION  06/25/2011   Procedure: ANTERIOR CERVICAL DECOMPRESSION/DISCECTOMY FUSION 3 LEVELS;  Surgeon: Sean Green;   Location: [facility];  Service: Neurosurgery;  Laterality: N/A;  Cervical Four-Five Cervical Five-Six Cervical Six-Seven Anterior Cervical Decompression with Fusion plating and bonegraft   BUNIONECTOMY Left 01/15/2022   @[facility]  by Sean Green     (per pt has retained hardware)   CARDIAC CATHETERIZATION  03/18/2009   @[facility]  by Sean Green;  LVEF 55% w/ very mild hypokinesis in the distal anterolateral wall;  30% pRCA,   dLAD bridging   CARPAL TUNNEL RELEASE Right 1991   EXTRACORPOREAL SHOCK WAVE LITHOTRIPSY Right 12/17/2015   @[facility]    LUMBAR LAMINECTOMY/DECOMPRESSION MICRODISCECTOMY Left 09/07/2013   Procedure: LAMINECTOMY L5 - S1 ON THE LEFT 1 LEVEL;  Surgeon: Sean Green;  Location: [facility] OR;  Service: Orthopedics;  Laterality: Left;   SHOULDER ARTHROSCOPY DISTAL CLAVICLE EXCISION AND OPEN ROTATOR CUFF REPAIR Left    SHOULDER ARTHROSCOPY W/ ROTATOR CUFF REPAIR Right 2002   TOTAL HIP ARTHROPLASTY Right 2008    Social History   Socioeconomic History   Marital status: Divorced    Spouse name: Not on file   Number of children: Not on file   Years of education: Not on file   Highest education level: Not on file  Occupational History   Not on file  Tobacco Use   Smoking status: Former    Current packs/day: 0.50    Average packs/day: 0.5 packs/day for 10.0 years (5.0 ttl pk-yrs)    Types: Cigarettes   Smokeless tobacco: Former   Tobacco comments:    09-05-2022  per pt quit smoking cigarettes / chew tobacco approx 1990 , smoked for 10 yrs;  currently occasional dip tobacco   Vaping Use   Vaping status: Never Used  Substance and Sexual Activity   Alcohol use: Not Currently   Drug use: Never   Sexual activity: Not on file  Other Topics Concern   Not on file  Social History Narrative   Lives alone in a two story home.  Has 2 grown daughters.  Worked as a Engineer, maintenance (IT).  Currently not working.  Education: one year of college.   Social Determinants of Health   Financial Resource  Strain: Not on file  Food Insecurity: No Food Insecurity (07/29/2022)   Hunger Vital Sign    Worried About Green Out of Food in the Last Year: Never true    Ran Out of Food in the Last Year: Never true  Transportation Needs: No Transportation Needs (07/29/2022)   PRAPARE - Administrator, Civil Service (Medical): No    Lack of Transportation (Non-Medical): No  Physical Activity: Not on file  Stress: Not on file  Social Connections: Unknown (06/04/2021)   [facility]   Social Network    Social Network: Not on file  Intimate Partner Violence: Not At Risk (07/29/2022)   Humiliation, Afraid, Rape, and Kick questionnaire    Fear of Current or Ex-Partner: No    Emotionally Abused: No  Physically Abused: No    Sexually Abused: No    Family History  Problem Relation Age of Onset   Anesthesia problems Neg Hx    Hypotension Neg Hx    Malignant hyperthermia Neg Hx    Pseudochol deficiency Neg Hx     Anti-infectives: Anti-infectives (From admission, onward)    None       No current facility-administered medications for this encounter.   Current Outpatient Medications  Medication Sig Dispense Refill   abiraterone acetate (ZYTIGA) 250 MG tablet Take 4 tablets (1,000 mg total) by mouth daily. Take on an empty stomach 1 hour before or 2 hours after a meal 120 tablet 0   amLODipine (NORVASC) 10 MG tablet Take 10 mg by mouth daily.     Ascorbic Acid (VITAMIN C) 1000 MG tablet Take 1,000 mg by mouth daily.     aspirin EC 81 MG tablet Take 81 mg by mouth daily.     cholecalciferol (VITAMIN D3) 25 MCG (1000 UNIT) tablet Take 1,000 Units by mouth daily.     citalopram (CELEXA) 40 MG tablet Take 40 mg by mouth daily.       Cyanocobalamin (B-12) 2500 MCG TABS Take 1 tablet by mouth daily.     fluticasone (FLONASE) 50 MCG/ACT nasal spray Place into both nostrils daily as needed for allergies or rhinitis.     lisinopril-hydrochlorothiazide (ZESTORETIC) 20-12.5 MG  tablet Take 2 tablets by mouth daily.     Melatonin 10 MG CAPS Take 1 capsule by mouth at bedtime.     metFORMIN (GLUCOPHAGE-XR) 500 MG 24 hr tablet Take 500 mg by mouth daily with supper.     Olopatadine HCl 0.2 % SOLN Place 1 drop into both eyes daily as needed (for allergies).      predniSONE (DELTASONE) 5 MG tablet Take 1 tablet (5 mg total) by mouth daily with breakfast. 30 tablet 5   relugolix (ORGOVYX) 120 MG tablet Take 120 mg by mouth daily.     rosuvastatin (CRESTOR) 5 MG tablet Take 5 mg by mouth daily.     tamsulosin (FLOMAX) 0.4 MG CAPS capsule Take 0.4 mg by mouth daily after supper.     zinc gluconate 50 MG tablet Take 50 mg by mouth daily.       Objective: Vital signs in last 24 hours: Ht 6\' 2"  (1.88 m)   Wt 127 kg   BMI 35.95 kg/m   Intake/Output from previous day: No intake/output data recorded. Intake/Output this shift: No intake/output data recorded.   Physical Exam Vitals reviewed.  Constitutional:      Appearance: Normal appearance.  Cardiovascular:     Rate and Rhythm: Normal rate and regular rhythm.  Pulmonary:     Effort: Pulmonary effort is normal. No respiratory distress.  Neurological:     Mental Status: He is alert.     Lab Results:  No results found for this or any previous visit (from the past 24 hour(s)).  BMET No results for input(s): "NA", "K", "CL", "CO2", "GLUCOSE", "BUN", "CREATININE", "CALCIUM" in the last 72 hours. PT/INR No results for input(s): "LABPROT", "INR" in the last 72 hours. ABG No results for input(s): "PHART", "HCO3" in the last 72 hours.  Invalid input(s): "PCO2", "PO2"  Studies/Results: No results found.   Assessment/Plan: T2b N1 M0 GG4 high risk prostate cancer.   Proceed with placement and fiducials and SpaceOAR in preparation for EXRT with ongoing LT-ADT.   No orders of the defined types were placed in this  encounter.    No orders of the defined types were placed in this encounter.    No follow-ups  on file.         Bjorn Pippin 09/11/2022

## 2022-09-12 ENCOUNTER — Encounter (HOSPITAL_BASED_OUTPATIENT_CLINIC_OR_DEPARTMENT_OTHER): Payer: Self-pay | Admitting: Urology

## 2022-09-12 ENCOUNTER — Ambulatory Visit (HOSPITAL_BASED_OUTPATIENT_CLINIC_OR_DEPARTMENT_OTHER): Payer: Medicare Other | Admitting: Certified Registered Nurse Anesthetist

## 2022-09-12 ENCOUNTER — Ambulatory Visit (HOSPITAL_BASED_OUTPATIENT_CLINIC_OR_DEPARTMENT_OTHER)
Admission: RE | Admit: 2022-09-12 | Discharge: 2022-09-12 | Disposition: A | Discharge status: Home / Self Care | Payer: Medicare Other | Source: Ambulatory Visit | Attending: Urology | Admitting: Urology

## 2022-09-12 ENCOUNTER — Encounter (HOSPITAL_BASED_OUTPATIENT_CLINIC_OR_DEPARTMENT_OTHER)
Admission: RE | Disposition: A | Discharge status: Home / Self Care | Payer: Self-pay | Source: Ambulatory Visit | Attending: Urology

## 2022-09-12 DIAGNOSIS — C61 Malignant neoplasm of prostate: Secondary | ICD-10-CM

## 2022-09-12 DIAGNOSIS — I2510 Atherosclerotic heart disease of native coronary artery without angina pectoris: Secondary | ICD-10-CM

## 2022-09-12 DIAGNOSIS — I10 Essential (primary) hypertension: Secondary | ICD-10-CM

## 2022-09-12 DIAGNOSIS — Z87891 Personal history of nicotine dependence: Secondary | ICD-10-CM | POA: Insufficient documentation

## 2022-09-12 DIAGNOSIS — E119 Type 2 diabetes mellitus without complications: Secondary | ICD-10-CM | POA: Diagnosis not present

## 2022-09-12 DIAGNOSIS — Z7984 Long term (current) use of oral hypoglycemic drugs: Secondary | ICD-10-CM | POA: Insufficient documentation

## 2022-09-12 DIAGNOSIS — Z01818 Encounter for other preprocedural examination: Secondary | ICD-10-CM

## 2022-09-12 DIAGNOSIS — M1990 Unspecified osteoarthritis, unspecified site: Secondary | ICD-10-CM | POA: Insufficient documentation

## 2022-09-12 DIAGNOSIS — F418 Other specified anxiety disorders: Secondary | ICD-10-CM | POA: Insufficient documentation

## 2022-09-12 HISTORY — PX: SPACE OAR INSTILLATION: SHX6769

## 2022-09-12 HISTORY — DX: Nodular prostate with lower urinary tract symptoms: N40.3

## 2022-09-12 HISTORY — DX: Unspecified osteoarthritis, unspecified site: M19.90

## 2022-09-12 HISTORY — DX: Induration penis plastica: N48.6

## 2022-09-12 HISTORY — DX: Secondary and unspecified malignant neoplasm of intrapelvic lymph nodes: C77.5

## 2022-09-12 HISTORY — DX: Family history of other specified conditions: Z84.89

## 2022-09-12 HISTORY — PX: GOLD SEED IMPLANT: SHX6343

## 2022-09-12 HISTORY — DX: Benign paroxysmal vertigo, unspecified ear: H81.10

## 2022-09-12 HISTORY — DX: Major depressive disorder, single episode, unspecified: F32.9

## 2022-09-12 HISTORY — DX: Testicular hypofunction: E29.1

## 2022-09-12 HISTORY — DX: Type 2 diabetes mellitus without complications: E11.9

## 2022-09-12 HISTORY — DX: Male erectile dysfunction, unspecified: N52.9

## 2022-09-12 HISTORY — DX: Polyneuropathy, unspecified: G62.9

## 2022-09-12 HISTORY — DX: Generalized anxiety disorder: F41.1

## 2022-09-12 LAB — POCT I-STAT, CHEM 8
BUN: 15 mg/dL (ref 8–23)
Calcium, Ion: 1.14 mmol/L — ABNORMAL LOW (ref 1.15–1.40)
Chloride: 98 mmol/L (ref 98–111)
Creatinine, Ser: 0.9 mg/dL (ref 0.61–1.24)
Glucose, Bld: 133 mg/dL — ABNORMAL HIGH (ref 70–99)
HCT: 47 % (ref 39.0–52.0)
Hemoglobin: 16 g/dL (ref 13.0–17.0)
Potassium: 3 mmol/L — ABNORMAL LOW (ref 3.5–5.1)
Sodium: 141 mmol/L (ref 135–145)
TCO2: 29 mmol/L (ref 22–32)

## 2022-09-12 LAB — GLUCOSE, CAPILLARY: Glucose-Capillary: 137 mg/dL — ABNORMAL HIGH (ref 70–99)

## 2022-09-12 SURGERY — INSERTION, GOLD SEEDS
Anesthesia: General | Site: Prostate

## 2022-09-12 MED ORDER — PROPOFOL 500 MG/50ML IV EMUL
INTRAVENOUS | Status: AC
  Filled 2022-09-12: qty 50

## 2022-09-12 MED ORDER — PROPOFOL 10 MG/ML IV BOLUS
INTRAVENOUS | Status: DC | PRN
Start: 2022-09-12 — End: 2022-09-12
  Administered 2022-09-12: 250 mg via INTRAVENOUS

## 2022-09-12 MED ORDER — LIDOCAINE HCL 2 % IJ SOLN
INTRAMUSCULAR | Status: DC | PRN
  Administered 2022-09-12: 10 mL

## 2022-09-12 MED ORDER — OXYCODONE HCL 5 MG PO TABS: 5.0000 mg | ORAL_TABLET | Freq: Once | ORAL | Status: DC | PRN

## 2022-09-12 MED ORDER — MORPHINE SULFATE (PF) 4 MG/ML IV SOLN: 2.0000 mg | INTRAVENOUS | Status: DC | PRN

## 2022-09-12 MED ORDER — DROPERIDOL 2.5 MG/ML IJ SOLN: 0.6250 mg | Freq: Once | INTRAMUSCULAR | Status: DC | PRN

## 2022-09-12 MED ORDER — OXYCODONE HCL 5 MG PO TABS: 5.0000 mg | ORAL_TABLET | ORAL | Status: DC | PRN

## 2022-09-12 MED ORDER — FENTANYL CITRATE (PF) 100 MCG/2ML IJ SOLN
INTRAMUSCULAR | Status: DC | PRN
  Administered 2022-09-12: 50 ug via INTRAVENOUS

## 2022-09-12 MED ORDER — SODIUM CHLORIDE (PF) 0.9 % IJ SOLN
INTRAMUSCULAR | Status: DC | PRN
  Administered 2022-09-12: 10 mL

## 2022-09-12 MED ORDER — CEFAZOLIN SODIUM-DEXTROSE 2-4 GM/100ML-% IV SOLN
INTRAVENOUS | Status: AC
  Filled 2022-09-12: qty 100

## 2022-09-12 MED ORDER — SODIUM CHLORIDE 0.9% FLUSH: 3.0000 mL | Freq: Two times a day (BID) | INTRAVENOUS | Status: DC

## 2022-09-12 MED ORDER — SCOPOLAMINE 1 MG/3DAYS TD PT72
1.0000 | MEDICATED_PATCH | TRANSDERMAL | Status: DC
  Administered 2022-09-12: 1.5 mg via TRANSDERMAL

## 2022-09-12 MED ORDER — ACETAMINOPHEN 10 MG/ML IV SOLN: 1000.0000 mg | Freq: Once | INTRAVENOUS | Status: DC | PRN

## 2022-09-12 MED ORDER — LIDOCAINE HCL (PF) 2 % IJ SOLN
INTRAMUSCULAR | Status: AC
  Filled 2022-09-12: qty 5

## 2022-09-12 MED ORDER — EPHEDRINE SULFATE-NACL 50-0.9 MG/10ML-% IV SOSY
PREFILLED_SYRINGE | INTRAVENOUS | Status: DC | PRN
  Administered 2022-09-12: 10 mg via INTRAVENOUS

## 2022-09-12 MED ORDER — DEXAMETHASONE SODIUM PHOSPHATE 10 MG/ML IJ SOLN
INTRAMUSCULAR | Status: AC
  Filled 2022-09-12: qty 1

## 2022-09-12 MED ORDER — FLEET ENEMA 7-19 GM/118ML RE ENEM: 1.0000 | ENEMA | Freq: Once | RECTAL | Status: DC

## 2022-09-12 MED ORDER — MIDAZOLAM HCL 2 MG/2ML IJ SOLN
INTRAMUSCULAR | Status: AC
  Filled 2022-09-12: qty 2

## 2022-09-12 MED ORDER — ACETAMINOPHEN 325 MG RE SUPP: 650.0000 mg | RECTAL | Status: DC | PRN

## 2022-09-12 MED ORDER — FENTANYL CITRATE (PF) 100 MCG/2ML IJ SOLN
INTRAMUSCULAR | Status: AC
  Filled 2022-09-12: qty 2

## 2022-09-12 MED ORDER — ACETAMINOPHEN 325 MG PO TABS: 650.0000 mg | ORAL_TABLET | ORAL | Status: DC | PRN

## 2022-09-12 MED ORDER — OXYCODONE HCL 5 MG/5ML PO SOLN: 5.0000 mg | Freq: Once | ORAL | Status: DC | PRN

## 2022-09-12 MED ORDER — FENTANYL CITRATE (PF) 100 MCG/2ML IJ SOLN: 25.0000 ug | INTRAMUSCULAR | Status: DC | PRN

## 2022-09-12 MED ORDER — ONDANSETRON HCL 4 MG/2ML IJ SOLN
INTRAMUSCULAR | Status: AC
  Filled 2022-09-12: qty 2

## 2022-09-12 MED ORDER — LACTATED RINGERS IV SOLN: INTRAVENOUS | Status: DC

## 2022-09-12 MED ORDER — PHENYLEPHRINE 80 MCG/ML (10ML) SYRINGE FOR IV PUSH (FOR BLOOD PRESSURE SUPPORT)
PREFILLED_SYRINGE | INTRAVENOUS | Status: DC | PRN
  Administered 2022-09-12: 160 ug via INTRAVENOUS
  Administered 2022-09-12: 80 ug via INTRAVENOUS
  Administered 2022-09-12 (×2): 120 ug via INTRAVENOUS

## 2022-09-12 MED ORDER — SCOPOLAMINE 1 MG/3DAYS TD PT72
MEDICATED_PATCH | TRANSDERMAL | Status: AC
  Filled 2022-09-12: qty 1

## 2022-09-12 MED ORDER — CEFAZOLIN SODIUM-DEXTROSE 2-4 GM/100ML-% IV SOLN
2.0000 g | INTRAVENOUS | Status: AC
  Administered 2022-09-12: 2 g via INTRAVENOUS

## 2022-09-12 MED ORDER — LIDOCAINE 2% (20 MG/ML) 5 ML SYRINGE
INTRAMUSCULAR | Status: DC | PRN
  Administered 2022-09-12: 100 mg via INTRAVENOUS

## 2022-09-12 MED ORDER — MIDAZOLAM HCL 5 MG/5ML IJ SOLN
INTRAMUSCULAR | Status: DC | PRN
  Administered 2022-09-12: 2 mg via INTRAVENOUS

## 2022-09-12 SURGICAL SUPPLY — 27 items
BLADE CLIPPER SENSICLIP SURGIC (BLADE) ×1 IMPLANT
CNTNR URN SCR LID CUP LEK RST (MISCELLANEOUS) ×1 IMPLANT
CONT SPEC 4OZ STRL OR WHT (MISCELLANEOUS) ×1
COVER BACK TABLE 60X90IN (DRAPES) ×1 IMPLANT
DRSG TEGADERM 4X4.75 (GAUZE/BANDAGES/DRESSINGS) ×1 IMPLANT
DRSG TEGADERM 8X12 (GAUZE/BANDAGES/DRESSINGS) ×1 IMPLANT
GAUZE SPONGE 4X4 12PLY STRL (GAUZE/BANDAGES/DRESSINGS) ×1 IMPLANT
GLOVE ECLIPSE 8.0 STRL XLNG CF (GLOVE) ×1 IMPLANT
GLOVE SURG ORTHO 8.5 STRL (GLOVE) ×1 IMPLANT
GLOVE SURG SS PI 7.5 STRL IVOR (GLOVE) IMPLANT
GLOVE SURG SS PI 8.0 STRL IVOR (GLOVE) ×1 IMPLANT
IMPL SPACEOAR VUE SYSTEM (Spacer) ×1 IMPLANT
IMPLANT SPACEOAR VUE SYSTEM (Spacer) ×1 IMPLANT
KIT TURNOVER CYSTO (KITS) ×1 IMPLANT
MARKER GOLD PRELOAD 1.2X3 (Urological Implant) ×1 IMPLANT
MARKER SKIN DUAL TIP RULER LAB (MISCELLANEOUS) ×1 IMPLANT
NDL SPNL 22GX3.5 QUINCKE BK (NEEDLE) ×1 IMPLANT
NEEDLE SPNL 22GX3.5 QUINCKE BK (NEEDLE) ×1 IMPLANT
SEED GOLD PRELOAD 1.2X3 (Urological Implant) ×1 IMPLANT
SHEATH ULTRASOUND LF (SHEATH) IMPLANT
SHEATH ULTRASOUND LTX NONSTRL (SHEATH) IMPLANT
SLEEVE SCD COMPRESS KNEE MED (STOCKING) ×1 IMPLANT
SURGILUBE 2OZ TUBE FLIPTOP (MISCELLANEOUS) ×1 IMPLANT
SYR 10ML LL (SYRINGE) IMPLANT
SYR CONTROL 10ML LL (SYRINGE) ×1 IMPLANT
TOWEL OR 17X24 6PK STRL BLUE (TOWEL DISPOSABLE) ×1 IMPLANT
UNDERPAD 30X36 HEAVY ABSORB (UNDERPADS AND DIAPERS) ×1 IMPLANT

## 2022-09-12 NOTE — Op Note (Signed)
Procedure: 1.  Transrectal ultrasound-guided placement of fiducial markers. 2.  Transrectal ultrasound-guided placement of SpaceOAR gel.   Preop diagnosis: Prostate cancer.   Postop diagnosis: Same.   Surgeon: Dr. Bjorn Pippin.   Anesthesia: General.   Specimen: None   EBL: None.   Drain: None.   Complications: None. . Indications: The patient is a 67 year old male with history of prostate cancer he is to undergo radiation therapy and in preparation is to have fiducial markers and SpaceOAR gel placed.   Procedure: He was given Ancef for antibiotic coverage.  He was taken the operating room was placed in lithotomy position and given sedation as needed.  His perineum was clipped and the scrotum was reflected superiorly with an OpSite.  Perineum was prepped with Betadine solution.   The B and K transrectal ultrasound probe was assembled and inserted after generous lubrication.  The prostate was visualized in the sagittal and transverse planes.   The mid perineum was infiltrated with 10mL of 2% lidocaine from the skin level down to the prostatic apex.   The fiducial markers were then placed under ultrasound guidance into the right base medial prostate, the right apex and the left mid lateral prostate.   The SpaceOAR needle was then placed under ultrasound guidance into the post prostatic fat strip and advanced to the base of the prostate.  Saline was infiltrated and the space expanded appropriately.  No blood was returned with aspiration.   The SpaceOAR Vue gel polymer was then instilled over a 14-second.  With excellent distribution underneath the prostate and no evidence of rectal infiltration.  The needle was then removed.   The ultrasound probe was removed and he was taken down from the lithotomy position and moved recovery room in stable condition after the wound was cleansed and a dressing was placed.

## 2022-09-12 NOTE — Interval H&P Note (Signed)
History and Physical Interval Note:  09/12/2022 7:15 AM  [name]  has presented today for surgery, with the diagnosis of PROSTATE CANCER.  The various methods of treatment have been discussed with the patient and family. After consideration of risks, benefits and other options for treatment, the patient has consented to  Procedure(s): GOLD SEED IMPLANT (N/A) SPACE OAR INSTILLATION (N/A) as a surgical intervention.  The patient's history has been reviewed, patient examined, no change in status, stable for surgery.  I have reviewed the patient's chart and labs.  Questions were answered to the patient's satisfaction.     Sean Green

## 2022-09-12 NOTE — Anesthesia Procedure Notes (Signed)
Procedure Name: LMA Insertion Date/Time: 09/12/2022 7:40 AM  Performed by: Briant Sites, CRNAPre-anesthesia Checklist: Patient identified, Emergency Drugs available, Suction available and Patient being monitored Patient Re-evaluated:Patient Re-evaluated prior to induction Oxygen Delivery Method: Circle system utilized Preoxygenation: Pre-oxygenation with 100% oxygen Induction Type: IV induction Ventilation: Mask ventilation without difficulty LMA: LMA with gastric port inserted LMA Size: 5.0 Number of attempts: 1 Airway Equipment and Method: Bite block Placement Confirmation: positive ETCO2 Tube secured with: Tape Dental Injury: Teeth and Oropharynx as per pre-operative assessment

## 2022-09-12 NOTE — Discharge Instructions (Signed)
  Post Anesthesia Home Care Instructions  Activity: Get plenty of rest for the remainder of the day. A responsible individual must stay with you for 24 hours following the procedure.  For the next 24 hours, DO NOT: -Drive a car -Paediatric nurse -Drink alcoholic beverages -Take any medication unless instructed by your physician -Make any legal decisions or sign important papers.  Meals: Start with liquid foods such as gelatin or soup. Progress to regular foods as tolerated. Avoid greasy, spicy, heavy foods. If nausea and/or vomiting occur, drink only clear liquids until the nausea and/or vomiting subsides. Call your physician if vomiting continues.  Special Instructions/Symptoms: Your throat may feel dry or sore from the anesthesia or the breathing tube placed in your throat during surgery. If this causes discomfort, gargle with warm salt water. The discomfort should disappear within 24 hours.  If you had a scopolamine patch placed behind your ear for the management of post- operative nausea and/or vomiting:  1. The medication in the patch is effective for 72 hours, after which it should be removed.  Wrap patch in a tissue and discard in the trash. Wash hands thoroughly with soap and water. 2. You may remove the patch earlier than 72 hours if you experience unpleasant side effects which may include dry mouth, dizziness or visual disturbances. 3. Avoid touching the patch. Wash your hands with soap and water after contact with the patch.    Post Anesthesia Home Care Instructions  Activity: Get plenty of rest for the remainder of the day. A responsible individual must stay with you for 24 hours following the procedure.  For the next 24 hours, DO NOT: -Drive a car -Paediatric nurse -Drink alcoholic beverages -Take any medication unless instructed by your physician -Make any legal decisions or sign important papers.  Meals: Start with liquid foods such as gelatin or soup. Progress to  regular foods as tolerated. Avoid greasy, spicy, heavy foods. If nausea and/or vomiting occur, drink only clear liquids until the nausea and/or vomiting subsides. Call your physician if vomiting continues.  Special Instructions/Symptoms: Your throat may feel dry or sore from the anesthesia or the breathing tube placed in your throat during surgery. If this causes discomfort, gargle with warm salt water. The discomfort should disappear within 24 hours.

## 2022-09-12 NOTE — Anesthesia Preprocedure Evaluation (Signed)
Anesthesia Evaluation  Patient identified by MRN, date of birth, ID band Patient awake    Reviewed: Allergy & Precautions, H&P , NPO status , Patient's Chart, lab work & pertinent test results  History of Anesthesia Complications (+) PONV and history of anesthetic complications  Airway Mallampati: III  TM Distance: >3 FB Neck ROM: Full    Dental no notable dental hx.    Pulmonary neg pulmonary ROS, former smoker   Pulmonary exam normal breath sounds clear to auscultation       Cardiovascular hypertension, + CAD  Normal cardiovascular exam Rhythm:Regular Rate:Normal     Neuro/Psych   Anxiety Depression    negative neurological ROS  negative psych ROS   GI/Hepatic negative GI ROS, Neg liver ROS,,,  Endo/Other  negative endocrine ROSdiabetes    Renal/GU negative Renal ROS   Prostate cancer    Musculoskeletal  (+) Arthritis ,    Abdominal   Peds negative pediatric ROS (+)  Hematology negative hematology ROS (+)   Anesthesia Other Findings   Reproductive/Obstetrics negative OB ROS                              Anesthesia Physical Anesthesia Plan  ASA: 3  Anesthesia Plan: General   Post-op Pain Management:    Induction: Intravenous  PONV Risk Score and Plan: Ondansetron and Dexamethasone  Airway Management Planned: LMA  Additional Equipment:   Intra-op Plan:   Post-operative Plan: Extubation in OR  Informed Consent: I have reviewed the patients History and Physical, chart, labs and discussed the procedure including the risks, benefits and alternatives for the proposed anesthesia with the patient or authorized representative who has indicated his/her understanding and acceptance.     Dental advisory given  Plan Discussed with: CRNA  Anesthesia Plan Comments:          Anesthesia Quick Evaluation

## 2022-09-12 NOTE — Transfer of Care (Signed)
Immediate Anesthesia Transfer of Care Note  Patient: Sean Green  Procedure(s) Performed: GOLD SEED IMPLANT (Prostate) SPACE OAR INSTILLATION (Prostate)  Patient Location: PACU  Anesthesia Type:General  Level of Consciousness: sedated  Airway & Oxygen Therapy: Patient Spontanous Breathing and Patient connected to nasal cannula oxygen  Post-op Assessment: Report given to RN  Post vital signs: Reviewed and stable  Last Vitals:  Vitals Value Taken Time  BP 117/68   Temp    Pulse 79 09/12/22 0805  Resp 16 09/12/22 0805  SpO2 96 % 09/12/22 0805  Vitals shown include unfiled device data.  Last Pain:  Vitals:   09/12/22 0538  TempSrc: Oral  PainSc: 0-No pain      Patients Stated Pain Goal: 3 (09/12/22 0538)  Complications: No notable events documented.

## 2022-09-12 NOTE — Anesthesia Postprocedure Evaluation (Signed)
Anesthesia Post Note  Patient: Sean Green  Procedure(s) Performed: GOLD SEED IMPLANT (Prostate) SPACE OAR INSTILLATION (Prostate)     Patient location during evaluation: PACU Anesthesia Type: General Level of consciousness: awake and alert Pain management: pain level controlled Vital Signs Assessment: post-procedure vital signs reviewed and stable Respiratory status: spontaneous breathing, nonlabored ventilation, respiratory function stable and patient connected to nasal cannula oxygen Cardiovascular status: blood pressure returned to baseline and stable Postop Assessment: no apparent nausea or vomiting Anesthetic complications: no   No notable events documented.  Last Vitals:  Vitals:   09/12/22 0805 09/12/22 0845  BP: 117/68   Pulse: 82   Resp: 16   Temp: 36.9 C 36.9 C  SpO2:      Last Pain:  Vitals:   09/12/22 0845  TempSrc:   PainSc: 0-No pain                 Antimony Nation

## 2022-09-15 ENCOUNTER — Encounter (HOSPITAL_BASED_OUTPATIENT_CLINIC_OR_DEPARTMENT_OTHER): Payer: Self-pay | Admitting: Urology

## 2022-09-16 ENCOUNTER — Telehealth: Payer: Self-pay | Admitting: *Deleted

## 2022-09-16 NOTE — Telephone Encounter (Signed)
CALLED PATIENT TO REMIND OF SIM APPT. FOR 09-18-22- ARRIVAL TIME- 10:45 AM @ [facility], INFORMED PATIENT TO ARRIVE WITH A FULL BLADDER, SPOKE WITH PATIENT AND HE IS AWARE OF THIS APPT. AND THE INSTRUCTIONS

## 2022-09-18 ENCOUNTER — Ambulatory Visit: Payer: Medicare Other | Admitting: Radiation Oncology

## 2022-09-18 DIAGNOSIS — C61 Malignant neoplasm of prostate: Secondary | ICD-10-CM

## 2022-09-24 ENCOUNTER — Other Ambulatory Visit (HOSPITAL_COMMUNITY): Payer: Self-pay

## 2022-09-24 ENCOUNTER — Other Ambulatory Visit: Payer: Self-pay | Admitting: Hematology

## 2022-09-24 ENCOUNTER — Other Ambulatory Visit: Payer: Self-pay

## 2022-09-24 MED ORDER — ABIRATERONE ACETATE 250 MG PO TABS
1000.0000 mg | ORAL_TABLET | Freq: Every day | ORAL | 0 refills | Status: AC
  Filled 2022-09-24: qty 120, 30d supply, fill #0

## 2022-09-26 ENCOUNTER — Other Ambulatory Visit: Payer: Self-pay

## 2022-09-26 ENCOUNTER — Ambulatory Visit
Admission: RE | Admit: 2022-09-26 | Discharge: 2022-09-26 | Disposition: A | Discharge status: Home / Self Care | Payer: Medicare Other | Source: Ambulatory Visit | Attending: Radiation Oncology | Admitting: Radiation Oncology

## 2022-09-26 DIAGNOSIS — C61 Malignant neoplasm of prostate: Secondary | ICD-10-CM | POA: Diagnosis present

## 2022-09-26 NOTE — Progress Notes (Signed)
  Radiation Oncology         [phone] ________________________________  Name: Sean Green MRN: [id number]  Date: 09/26/2022  DOB: 19-Feb-1955  SIMULATION AND TREATMENT PLANNING NOTE    ICD-10-CM   1. Malignant neoplasm of prostate (HCC)  C61       DIAGNOSIS:  67 y.o. 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   NARRATIVE:  The patient was brought to the CT Simulation planning suite.  Identity was confirmed.  All relevant records and images related to the planned course of therapy were reviewed.  The patient freely provided informed written consent to proceed with treatment after reviewing the details related to the planned course of therapy. The consent form was witnessed and verified by the simulation staff.  Then, the patient was set-up in a stable reproducible supine position for radiation therapy.  A vacuum lock pillow device was custom fabricated to position his legs in a reproducible immobilized position.  Then, I performed a urethrogram under sterile conditions to identify the prostatic apex.  CT images were obtained.  Surface markings were placed.  The CT images were loaded into the planning software.  Then the prostate target and avoidance structures including the rectum, bladder, bowel and hips were contoured.  Treatment planning then occurred.  The radiation prescription was entered and confirmed.  A total of one complex treatment devices was fabricated. I have requested : Intensity Modulated Radiotherapy (IMRT) is medically necessary for this case for the following reason:  Rectal sparing.Marland Kitchen  PLAN:   The prostate, seminal vesicles, and pelvic lymph nodes will initially be treated to 45 Gy in 25 fractions of 1.8 Gy followed by a boost to the prostate only, to 75 Gy with 15 additional fractions of 2.0 Gy; concurrent with Eligard and  Zytiga/prednisone.  ________________________________  Artist Pais Kathrynn Running, M.D.

## 2022-09-26 NOTE — Progress Notes (Signed)
Marylene Land nurse navigator from [facility] called stating the patient will now get his Zytiga and his Prednisone filled at the pharmacy there.

## 2022-10-06 DIAGNOSIS — C61 Malignant neoplasm of prostate: Secondary | ICD-10-CM | POA: Diagnosis not present

## 2022-10-08 ENCOUNTER — Other Ambulatory Visit: Payer: Self-pay

## 2022-10-08 ENCOUNTER — Ambulatory Visit
Admission: RE | Admit: 2022-10-08 | Discharge: 2022-10-08 | Disposition: A | Discharge status: Home / Self Care | Payer: Medicare Other | Source: Ambulatory Visit | Attending: Radiation Oncology | Admitting: Radiation Oncology

## 2022-10-08 DIAGNOSIS — C61 Malignant neoplasm of prostate: Secondary | ICD-10-CM | POA: Diagnosis not present

## 2022-10-08 LAB — RAD ONC ARIA SESSION SUMMARY
Course Elapsed Days: 0
Plan Fractions Treated to Date: 1
Plan Prescribed Dose Per Fraction: 1.8 Gy
Plan Total Fractions Prescribed: 25
Plan Total Prescribed Dose: 45 Gy
Reference Point Dosage Given to Date: 1.8 Gy
Reference Point Session Dosage Given: 1.8 Gy
Session Number: 1

## 2022-10-09 ENCOUNTER — Other Ambulatory Visit: Payer: Self-pay

## 2022-10-09 ENCOUNTER — Ambulatory Visit
Admission: RE | Admit: 2022-10-09 | Discharge: 2022-10-09 | Disposition: A | Discharge status: Home / Self Care | Payer: Medicare Other | Source: Ambulatory Visit | Attending: Radiation Oncology | Admitting: Radiation Oncology

## 2022-10-09 ENCOUNTER — Other Ambulatory Visit (HOSPITAL_COMMUNITY): Payer: Self-pay

## 2022-10-09 DIAGNOSIS — C61 Malignant neoplasm of prostate: Secondary | ICD-10-CM | POA: Diagnosis not present

## 2022-10-09 LAB — RAD ONC ARIA SESSION SUMMARY
Course Elapsed Days: 1
Plan Fractions Treated to Date: 2
Plan Prescribed Dose Per Fraction: 1.8 Gy
Plan Total Fractions Prescribed: 25
Plan Total Prescribed Dose: 45 Gy
Reference Point Dosage Given to Date: 3.6 Gy
Reference Point Session Dosage Given: 1.8 Gy
Session Number: 2

## 2022-10-10 ENCOUNTER — Other Ambulatory Visit: Payer: Self-pay

## 2022-10-10 ENCOUNTER — Ambulatory Visit
Admission: RE | Admit: 2022-10-10 | Discharge: 2022-10-10 | Disposition: A | Discharge status: Home / Self Care | Payer: Medicare Other | Source: Ambulatory Visit | Attending: Radiation Oncology | Admitting: Radiation Oncology

## 2022-10-10 DIAGNOSIS — C61 Malignant neoplasm of prostate: Secondary | ICD-10-CM | POA: Diagnosis not present

## 2022-10-10 LAB — RAD ONC ARIA SESSION SUMMARY
Course Elapsed Days: 2
Plan Fractions Treated to Date: 3
Plan Prescribed Dose Per Fraction: 1.8 Gy
Plan Total Fractions Prescribed: 25
Plan Total Prescribed Dose: 45 Gy
Reference Point Dosage Given to Date: 5.4 Gy
Reference Point Session Dosage Given: 1.8 Gy
Session Number: 3

## 2022-10-13 ENCOUNTER — Ambulatory Visit
Admission: RE | Admit: 2022-10-13 | Discharge: 2022-10-13 | Disposition: A | Discharge status: Home / Self Care | Payer: Medicare Other | Source: Ambulatory Visit | Attending: Radiation Oncology | Admitting: Radiation Oncology

## 2022-10-13 ENCOUNTER — Other Ambulatory Visit: Payer: Self-pay

## 2022-10-13 DIAGNOSIS — C61 Malignant neoplasm of prostate: Secondary | ICD-10-CM | POA: Diagnosis not present

## 2022-10-13 LAB — RAD ONC ARIA SESSION SUMMARY
Course Elapsed Days: 5
Plan Fractions Treated to Date: 4
Plan Prescribed Dose Per Fraction: 1.8 Gy
Plan Total Fractions Prescribed: 25
Plan Total Prescribed Dose: 45 Gy
Reference Point Dosage Given to Date: 7.2 Gy
Reference Point Session Dosage Given: 1.8 Gy
Session Number: 4

## 2022-10-14 ENCOUNTER — Ambulatory Visit
Admission: RE | Admit: 2022-10-14 | Discharge: 2022-10-14 | Disposition: A | Discharge status: Home / Self Care | Payer: Medicare Other | Source: Ambulatory Visit | Attending: Radiation Oncology | Admitting: Radiation Oncology

## 2022-10-14 ENCOUNTER — Other Ambulatory Visit: Payer: Self-pay

## 2022-10-14 DIAGNOSIS — C61 Malignant neoplasm of prostate: Secondary | ICD-10-CM | POA: Diagnosis not present

## 2022-10-14 LAB — RAD ONC ARIA SESSION SUMMARY
Course Elapsed Days: 6
Plan Fractions Treated to Date: 5
Plan Prescribed Dose Per Fraction: 1.8 Gy
Plan Total Fractions Prescribed: 25
Plan Total Prescribed Dose: 45 Gy
Reference Point Dosage Given to Date: 9 Gy
Reference Point Session Dosage Given: 1.8 Gy
Session Number: 5

## 2022-10-15 ENCOUNTER — Ambulatory Visit: Payer: Medicare Other

## 2022-10-15 ENCOUNTER — Ambulatory Visit: Admission: RE | Admit: 2022-10-15 | Payer: Medicare Other | Source: Ambulatory Visit

## 2022-10-16 ENCOUNTER — Ambulatory Visit
Admission: RE | Admit: 2022-10-16 | Discharge: 2022-10-16 | Disposition: A | Discharge status: Home / Self Care | Payer: Medicare Other | Source: Ambulatory Visit | Attending: Radiation Oncology | Admitting: Radiation Oncology

## 2022-10-16 ENCOUNTER — Other Ambulatory Visit: Payer: Self-pay | Admitting: Radiation Oncology

## 2022-10-16 ENCOUNTER — Other Ambulatory Visit: Payer: Self-pay

## 2022-10-16 DIAGNOSIS — C61 Malignant neoplasm of prostate: Secondary | ICD-10-CM | POA: Diagnosis not present

## 2022-10-16 LAB — RAD ONC ARIA SESSION SUMMARY
Course Elapsed Days: 8
Plan Fractions Treated to Date: 6
Plan Prescribed Dose Per Fraction: 1.8 Gy
Plan Total Fractions Prescribed: 25
Plan Total Prescribed Dose: 45 Gy
Reference Point Dosage Given to Date: 10.8 Gy
Reference Point Session Dosage Given: 1.8 Gy
Session Number: 6

## 2022-10-16 MED ORDER — MEGESTROL ACETATE 20 MG PO TABS: 20.0000 mg | ORAL_TABLET | Freq: Every day | ORAL | 5 refills | Status: DC

## 2022-10-17 ENCOUNTER — Ambulatory Visit
Admission: RE | Admit: 2022-10-17 | Discharge: 2022-10-17 | Disposition: A | Discharge status: Home / Self Care | Payer: Medicare Other | Source: Ambulatory Visit | Attending: Radiation Oncology

## 2022-10-17 ENCOUNTER — Other Ambulatory Visit: Payer: Self-pay

## 2022-10-17 DIAGNOSIS — C61 Malignant neoplasm of prostate: Secondary | ICD-10-CM | POA: Diagnosis not present

## 2022-10-17 LAB — RAD ONC ARIA SESSION SUMMARY
Course Elapsed Days: 9
Plan Fractions Treated to Date: 7
Plan Prescribed Dose Per Fraction: 1.8 Gy
Plan Total Fractions Prescribed: 25
Plan Total Prescribed Dose: 45 Gy
Reference Point Dosage Given to Date: 12.6 Gy
Reference Point Session Dosage Given: 1.8 Gy
Session Number: 7

## 2022-10-20 ENCOUNTER — Ambulatory Visit
Admission: RE | Admit: 2022-10-20 | Discharge: 2022-10-20 | Disposition: A | Discharge status: Home / Self Care | Payer: Medicare Other | Source: Ambulatory Visit | Attending: Radiation Oncology

## 2022-10-20 ENCOUNTER — Other Ambulatory Visit: Payer: Self-pay

## 2022-10-20 DIAGNOSIS — C61 Malignant neoplasm of prostate: Secondary | ICD-10-CM | POA: Diagnosis not present

## 2022-10-20 LAB — RAD ONC ARIA SESSION SUMMARY
Course Elapsed Days: 12
Plan Fractions Treated to Date: 8
Plan Prescribed Dose Per Fraction: 1.8 Gy
Plan Total Fractions Prescribed: 25
Plan Total Prescribed Dose: 45 Gy
Reference Point Dosage Given to Date: 14.4 Gy
Reference Point Session Dosage Given: 1.8 Gy
Session Number: 8

## 2022-10-21 ENCOUNTER — Ambulatory Visit
Admission: RE | Admit: 2022-10-21 | Discharge: 2022-10-21 | Disposition: A | Discharge status: Home / Self Care | Payer: Medicare Other | Source: Ambulatory Visit | Attending: Radiation Oncology | Admitting: Radiation Oncology

## 2022-10-21 ENCOUNTER — Other Ambulatory Visit: Payer: Self-pay

## 2022-10-21 DIAGNOSIS — C61 Malignant neoplasm of prostate: Secondary | ICD-10-CM | POA: Insufficient documentation

## 2022-10-21 LAB — RAD ONC ARIA SESSION SUMMARY
Course Elapsed Days: 13
Plan Fractions Treated to Date: 9
Plan Prescribed Dose Per Fraction: 1.8 Gy
Plan Total Fractions Prescribed: 25
Plan Total Prescribed Dose: 45 Gy
Reference Point Dosage Given to Date: 16.2 Gy
Reference Point Session Dosage Given: 1.8 Gy
Session Number: 9

## 2022-10-22 ENCOUNTER — Other Ambulatory Visit: Payer: Self-pay

## 2022-10-22 ENCOUNTER — Ambulatory Visit
Admission: RE | Admit: 2022-10-22 | Discharge: 2022-10-22 | Disposition: A | Discharge status: Home / Self Care | Payer: Medicare Other | Source: Ambulatory Visit | Attending: Radiation Oncology | Admitting: Radiation Oncology

## 2022-10-22 DIAGNOSIS — C61 Malignant neoplasm of prostate: Secondary | ICD-10-CM | POA: Diagnosis not present

## 2022-10-22 LAB — RAD ONC ARIA SESSION SUMMARY
Course Elapsed Days: 14
Plan Fractions Treated to Date: 10
Plan Prescribed Dose Per Fraction: 1.8 Gy
Plan Total Fractions Prescribed: 25
Plan Total Prescribed Dose: 45 Gy
Reference Point Dosage Given to Date: 18 Gy
Reference Point Session Dosage Given: 1.8 Gy
Session Number: 10

## 2022-10-23 ENCOUNTER — Other Ambulatory Visit: Payer: Self-pay

## 2022-10-23 ENCOUNTER — Ambulatory Visit
Admission: RE | Admit: 2022-10-23 | Discharge: 2022-10-23 | Disposition: A | Discharge status: Home / Self Care | Payer: Medicare Other | Source: Ambulatory Visit | Attending: Radiation Oncology | Admitting: Radiation Oncology

## 2022-10-23 DIAGNOSIS — C61 Malignant neoplasm of prostate: Secondary | ICD-10-CM | POA: Diagnosis not present

## 2022-10-23 LAB — RAD ONC ARIA SESSION SUMMARY
Course Elapsed Days: 15
Plan Fractions Treated to Date: 11
Plan Prescribed Dose Per Fraction: 1.8 Gy
Plan Total Fractions Prescribed: 25
Plan Total Prescribed Dose: 45 Gy
Reference Point Dosage Given to Date: 19.8 Gy
Reference Point Session Dosage Given: 1.8 Gy
Session Number: 11

## 2022-10-24 ENCOUNTER — Ambulatory Visit
Admission: RE | Admit: 2022-10-24 | Discharge: 2022-10-24 | Disposition: A | Discharge status: Home / Self Care | Payer: Medicare Other | Source: Ambulatory Visit | Attending: Radiation Oncology | Admitting: Radiation Oncology

## 2022-10-24 ENCOUNTER — Other Ambulatory Visit: Payer: Self-pay

## 2022-10-24 ENCOUNTER — Ambulatory Visit
Admission: RE | Admit: 2022-10-24 | Discharge: 2022-10-24 | Disposition: A | Discharge status: Home / Self Care | Payer: Medicare Other | Source: Ambulatory Visit | Attending: Radiation Oncology

## 2022-10-24 DIAGNOSIS — C61 Malignant neoplasm of prostate: Secondary | ICD-10-CM | POA: Diagnosis not present

## 2022-10-24 LAB — RAD ONC ARIA SESSION SUMMARY
Course Elapsed Days: 16
Plan Fractions Treated to Date: 12
Plan Prescribed Dose Per Fraction: 1.8 Gy
Plan Total Fractions Prescribed: 25
Plan Total Prescribed Dose: 45 Gy
Reference Point Dosage Given to Date: 21.6 Gy
Reference Point Session Dosage Given: 1.8 Gy
Session Number: 12

## 2022-10-27 ENCOUNTER — Ambulatory Visit
Admission: RE | Admit: 2022-10-27 | Discharge: 2022-10-27 | Disposition: A | Discharge status: Home / Self Care | Payer: Medicare Other | Source: Ambulatory Visit | Attending: Radiation Oncology

## 2022-10-27 ENCOUNTER — Other Ambulatory Visit: Payer: Self-pay

## 2022-10-27 DIAGNOSIS — C61 Malignant neoplasm of prostate: Secondary | ICD-10-CM | POA: Diagnosis not present

## 2022-10-27 LAB — RAD ONC ARIA SESSION SUMMARY
Course Elapsed Days: 19
Plan Fractions Treated to Date: 13
Plan Prescribed Dose Per Fraction: 1.8 Gy
Plan Total Fractions Prescribed: 25
Plan Total Prescribed Dose: 45 Gy
Reference Point Dosage Given to Date: 23.4 Gy
Reference Point Session Dosage Given: 1.8 Gy
Session Number: 13

## 2022-10-28 ENCOUNTER — Ambulatory Visit
Admission: RE | Admit: 2022-10-28 | Discharge: 2022-10-28 | Disposition: A | Discharge status: Home / Self Care | Payer: Medicare Other | Source: Ambulatory Visit | Attending: Radiation Oncology | Admitting: Radiation Oncology

## 2022-10-28 ENCOUNTER — Other Ambulatory Visit: Payer: Self-pay

## 2022-10-28 DIAGNOSIS — C61 Malignant neoplasm of prostate: Secondary | ICD-10-CM | POA: Diagnosis not present

## 2022-10-28 LAB — RAD ONC ARIA SESSION SUMMARY
Course Elapsed Days: 20
Plan Fractions Treated to Date: 14
Plan Prescribed Dose Per Fraction: 1.8 Gy
Plan Total Fractions Prescribed: 25
Plan Total Prescribed Dose: 45 Gy
Reference Point Dosage Given to Date: 25.2 Gy
Reference Point Session Dosage Given: 1.8 Gy
Session Number: 14

## 2022-10-29 ENCOUNTER — Ambulatory Visit
Admission: RE | Admit: 2022-10-29 | Discharge: 2022-10-29 | Disposition: A | Discharge status: Home / Self Care | Payer: Medicare Other | Source: Ambulatory Visit | Attending: Radiation Oncology | Admitting: Radiation Oncology

## 2022-10-29 ENCOUNTER — Other Ambulatory Visit: Payer: Self-pay

## 2022-10-29 DIAGNOSIS — C61 Malignant neoplasm of prostate: Secondary | ICD-10-CM | POA: Diagnosis not present

## 2022-10-29 LAB — RAD ONC ARIA SESSION SUMMARY
Course Elapsed Days: 21
Plan Fractions Treated to Date: 15
Plan Prescribed Dose Per Fraction: 1.8 Gy
Plan Total Fractions Prescribed: 25
Plan Total Prescribed Dose: 45 Gy
Reference Point Dosage Given to Date: 27 Gy
Reference Point Session Dosage Given: 1.8 Gy
Session Number: 15

## 2022-10-30 ENCOUNTER — Other Ambulatory Visit: Payer: Self-pay

## 2022-10-30 ENCOUNTER — Ambulatory Visit
Admission: RE | Admit: 2022-10-30 | Discharge: 2022-10-30 | Disposition: A | Discharge status: Home / Self Care | Payer: Medicare Other | Source: Ambulatory Visit | Attending: Radiation Oncology | Admitting: Radiation Oncology

## 2022-10-30 DIAGNOSIS — C61 Malignant neoplasm of prostate: Secondary | ICD-10-CM | POA: Diagnosis not present

## 2022-10-30 LAB — RAD ONC ARIA SESSION SUMMARY
Course Elapsed Days: 22
Plan Fractions Treated to Date: 16
Plan Prescribed Dose Per Fraction: 1.8 Gy
Plan Total Fractions Prescribed: 25
Plan Total Prescribed Dose: 45 Gy
Reference Point Dosage Given to Date: 28.8 Gy
Reference Point Session Dosage Given: 1.8 Gy
Session Number: 16

## 2022-10-31 ENCOUNTER — Ambulatory Visit
Admission: RE | Admit: 2022-10-31 | Discharge: 2022-10-31 | Disposition: A | Discharge status: Home / Self Care | Payer: Medicare Other | Source: Ambulatory Visit | Attending: Radiation Oncology | Admitting: Radiation Oncology

## 2022-10-31 ENCOUNTER — Other Ambulatory Visit: Payer: Self-pay

## 2022-10-31 DIAGNOSIS — C61 Malignant neoplasm of prostate: Secondary | ICD-10-CM | POA: Diagnosis not present

## 2022-10-31 LAB — RAD ONC ARIA SESSION SUMMARY
Course Elapsed Days: 23
Plan Fractions Treated to Date: 17
Plan Prescribed Dose Per Fraction: 1.8 Gy
Plan Total Fractions Prescribed: 25
Plan Total Prescribed Dose: 45 Gy
Reference Point Dosage Given to Date: 30.6 Gy
Reference Point Session Dosage Given: 1.8 Gy
Session Number: 17

## 2022-11-03 ENCOUNTER — Other Ambulatory Visit: Payer: Self-pay

## 2022-11-03 ENCOUNTER — Ambulatory Visit
Admission: RE | Admit: 2022-11-03 | Discharge: 2022-11-03 | Disposition: A | Discharge status: Home / Self Care | Payer: Medicare Other | Source: Ambulatory Visit | Attending: Radiation Oncology

## 2022-11-03 DIAGNOSIS — C61 Malignant neoplasm of prostate: Secondary | ICD-10-CM | POA: Diagnosis not present

## 2022-11-03 LAB — RAD ONC ARIA SESSION SUMMARY
Course Elapsed Days: 26
Plan Fractions Treated to Date: 18
Plan Prescribed Dose Per Fraction: 1.8 Gy
Plan Total Fractions Prescribed: 25
Plan Total Prescribed Dose: 45 Gy
Reference Point Dosage Given to Date: 32.4 Gy
Reference Point Session Dosage Given: 1.8 Gy
Session Number: 18

## 2022-11-04 ENCOUNTER — Other Ambulatory Visit: Payer: Self-pay

## 2022-11-04 ENCOUNTER — Ambulatory Visit
Admission: RE | Admit: 2022-11-04 | Discharge: 2022-11-04 | Disposition: A | Discharge status: Home / Self Care | Payer: Medicare Other | Source: Ambulatory Visit | Attending: Radiation Oncology

## 2022-11-04 DIAGNOSIS — C61 Malignant neoplasm of prostate: Secondary | ICD-10-CM | POA: Diagnosis not present

## 2022-11-04 LAB — RAD ONC ARIA SESSION SUMMARY
Course Elapsed Days: 27
Plan Fractions Treated to Date: 19
Plan Prescribed Dose Per Fraction: 1.8 Gy
Plan Total Fractions Prescribed: 25
Plan Total Prescribed Dose: 45 Gy
Reference Point Dosage Given to Date: 34.2 Gy
Reference Point Session Dosage Given: 1.8 Gy
Session Number: 19

## 2022-11-05 ENCOUNTER — Other Ambulatory Visit: Payer: Self-pay

## 2022-11-05 ENCOUNTER — Ambulatory Visit
Admission: RE | Admit: 2022-11-05 | Discharge: 2022-11-05 | Disposition: A | Discharge status: Home / Self Care | Payer: Medicare Other | Source: Ambulatory Visit | Attending: Radiation Oncology

## 2022-11-05 DIAGNOSIS — C61 Malignant neoplasm of prostate: Secondary | ICD-10-CM | POA: Diagnosis not present

## 2022-11-05 LAB — RAD ONC ARIA SESSION SUMMARY
Course Elapsed Days: 28
Plan Fractions Treated to Date: 20
Plan Prescribed Dose Per Fraction: 1.8 Gy
Plan Total Fractions Prescribed: 25
Plan Total Prescribed Dose: 45 Gy
Reference Point Dosage Given to Date: 36 Gy
Reference Point Session Dosage Given: 1.8 Gy
Session Number: 20

## 2022-11-06 ENCOUNTER — Ambulatory Visit
Admission: RE | Admit: 2022-11-06 | Discharge: 2022-11-06 | Disposition: A | Discharge status: Home / Self Care | Payer: Medicare Other | Source: Ambulatory Visit | Attending: Radiation Oncology | Admitting: Radiation Oncology

## 2022-11-06 ENCOUNTER — Other Ambulatory Visit: Payer: Self-pay

## 2022-11-06 DIAGNOSIS — C61 Malignant neoplasm of prostate: Secondary | ICD-10-CM | POA: Diagnosis not present

## 2022-11-06 LAB — RAD ONC ARIA SESSION SUMMARY
Course Elapsed Days: 29
Plan Fractions Treated to Date: 21
Plan Prescribed Dose Per Fraction: 1.8 Gy
Plan Total Fractions Prescribed: 25
Plan Total Prescribed Dose: 45 Gy
Reference Point Dosage Given to Date: 37.8 Gy
Reference Point Session Dosage Given: 1.8 Gy
Session Number: 21

## 2022-11-07 ENCOUNTER — Ambulatory Visit
Admission: RE | Admit: 2022-11-07 | Discharge: 2022-11-07 | Disposition: A | Discharge status: Home / Self Care | Payer: Medicare Other | Source: Ambulatory Visit | Attending: Radiation Oncology | Admitting: Radiation Oncology

## 2022-11-07 ENCOUNTER — Other Ambulatory Visit: Payer: Self-pay

## 2022-11-07 DIAGNOSIS — C61 Malignant neoplasm of prostate: Secondary | ICD-10-CM | POA: Diagnosis not present

## 2022-11-07 LAB — RAD ONC ARIA SESSION SUMMARY
Course Elapsed Days: 30
Plan Fractions Treated to Date: 22
Plan Prescribed Dose Per Fraction: 1.8 Gy
Plan Total Fractions Prescribed: 25
Plan Total Prescribed Dose: 45 Gy
Reference Point Dosage Given to Date: 39.6 Gy
Reference Point Session Dosage Given: 1.8 Gy
Session Number: 22

## 2022-11-10 ENCOUNTER — Other Ambulatory Visit: Payer: Self-pay

## 2022-11-10 ENCOUNTER — Ambulatory Visit
Admission: RE | Admit: 2022-11-10 | Discharge: 2022-11-10 | Disposition: A | Discharge status: Home / Self Care | Payer: Medicare Other | Source: Ambulatory Visit | Attending: Radiation Oncology | Admitting: Radiation Oncology

## 2022-11-10 DIAGNOSIS — C61 Malignant neoplasm of prostate: Secondary | ICD-10-CM | POA: Diagnosis not present

## 2022-11-10 LAB — RAD ONC ARIA SESSION SUMMARY
Course Elapsed Days: 33
Plan Fractions Treated to Date: 23
Plan Prescribed Dose Per Fraction: 1.8 Gy
Plan Total Fractions Prescribed: 25
Plan Total Prescribed Dose: 45 Gy
Reference Point Dosage Given to Date: 41.4 Gy
Reference Point Session Dosage Given: 1.8 Gy
Session Number: 23

## 2022-11-11 ENCOUNTER — Other Ambulatory Visit: Payer: Self-pay

## 2022-11-11 ENCOUNTER — Ambulatory Visit
Admission: RE | Admit: 2022-11-11 | Discharge: 2022-11-11 | Disposition: A | Discharge status: Home / Self Care | Payer: Medicare Other | Source: Ambulatory Visit | Attending: Radiation Oncology

## 2022-11-11 DIAGNOSIS — C61 Malignant neoplasm of prostate: Secondary | ICD-10-CM | POA: Diagnosis not present

## 2022-11-11 LAB — RAD ONC ARIA SESSION SUMMARY
Course Elapsed Days: 34
Plan Fractions Treated to Date: 24
Plan Prescribed Dose Per Fraction: 1.8 Gy
Plan Total Fractions Prescribed: 25
Plan Total Prescribed Dose: 45 Gy
Reference Point Dosage Given to Date: 43.2 Gy
Reference Point Session Dosage Given: 1.8 Gy
Session Number: 24

## 2022-11-12 ENCOUNTER — Ambulatory Visit
Admission: RE | Admit: 2022-11-12 | Discharge: 2022-11-12 | Disposition: A | Discharge status: Home / Self Care | Payer: Medicare Other | Source: Ambulatory Visit | Attending: Radiation Oncology

## 2022-11-12 ENCOUNTER — Other Ambulatory Visit: Payer: Self-pay

## 2022-11-12 ENCOUNTER — Ambulatory Visit: Payer: Medicare Other

## 2022-11-12 DIAGNOSIS — C61 Malignant neoplasm of prostate: Secondary | ICD-10-CM | POA: Diagnosis not present

## 2022-11-12 LAB — RAD ONC ARIA SESSION SUMMARY
Course Elapsed Days: 35
Plan Fractions Treated to Date: 25
Plan Prescribed Dose Per Fraction: 1.8 Gy
Plan Total Fractions Prescribed: 25
Plan Total Prescribed Dose: 45 Gy
Reference Point Dosage Given to Date: 45 Gy
Reference Point Session Dosage Given: 1.8 Gy
Session Number: 25

## 2022-11-13 ENCOUNTER — Other Ambulatory Visit: Payer: Self-pay

## 2022-11-13 ENCOUNTER — Ambulatory Visit
Admission: RE | Admit: 2022-11-13 | Discharge: 2022-11-13 | Disposition: A | Discharge status: Home / Self Care | Payer: Medicare Other | Source: Ambulatory Visit | Attending: Radiation Oncology | Admitting: Radiation Oncology

## 2022-11-13 ENCOUNTER — Ambulatory Visit: Payer: Medicare Other

## 2022-11-13 DIAGNOSIS — C61 Malignant neoplasm of prostate: Secondary | ICD-10-CM | POA: Diagnosis not present

## 2022-11-13 LAB — RAD ONC ARIA SESSION SUMMARY
Course Elapsed Days: 36
Plan Fractions Treated to Date: 1
Plan Prescribed Dose Per Fraction: 2 Gy
Plan Total Fractions Prescribed: 15
Plan Total Prescribed Dose: 30 Gy
Reference Point Dosage Given to Date: 2 Gy
Reference Point Session Dosage Given: 2 Gy
Session Number: 26

## 2022-11-14 ENCOUNTER — Other Ambulatory Visit: Payer: Self-pay

## 2022-11-14 ENCOUNTER — Ambulatory Visit
Admission: RE | Admit: 2022-11-14 | Discharge: 2022-11-14 | Disposition: A | Discharge status: Home / Self Care | Payer: Medicare Other | Source: Ambulatory Visit | Attending: Radiation Oncology

## 2022-11-14 DIAGNOSIS — C61 Malignant neoplasm of prostate: Secondary | ICD-10-CM | POA: Diagnosis not present

## 2022-11-14 LAB — RAD ONC ARIA SESSION SUMMARY
Course Elapsed Days: 37
Plan Fractions Treated to Date: 2
Plan Prescribed Dose Per Fraction: 2 Gy
Plan Total Fractions Prescribed: 15
Plan Total Prescribed Dose: 30 Gy
Reference Point Dosage Given to Date: 4 Gy
Reference Point Session Dosage Given: 2 Gy
Session Number: 27

## 2022-11-17 ENCOUNTER — Other Ambulatory Visit: Payer: Self-pay

## 2022-11-17 ENCOUNTER — Ambulatory Visit
Admission: RE | Admit: 2022-11-17 | Discharge: 2022-11-17 | Disposition: A | Discharge status: Home / Self Care | Payer: Medicare Other | Source: Ambulatory Visit | Attending: Radiation Oncology | Admitting: Radiation Oncology

## 2022-11-17 DIAGNOSIS — C61 Malignant neoplasm of prostate: Secondary | ICD-10-CM | POA: Diagnosis not present

## 2022-11-17 LAB — RAD ONC ARIA SESSION SUMMARY
Course Elapsed Days: 40
Plan Fractions Treated to Date: 3
Plan Prescribed Dose Per Fraction: 2 Gy
Plan Total Fractions Prescribed: 15
Plan Total Prescribed Dose: 30 Gy
Reference Point Dosage Given to Date: 6 Gy
Reference Point Session Dosage Given: 2 Gy
Session Number: 28

## 2022-11-18 ENCOUNTER — Other Ambulatory Visit: Payer: Self-pay

## 2022-11-18 ENCOUNTER — Ambulatory Visit
Admission: RE | Admit: 2022-11-18 | Discharge: 2022-11-18 | Disposition: A | Discharge status: Home / Self Care | Payer: Medicare Other | Source: Ambulatory Visit | Attending: Radiation Oncology

## 2022-11-18 DIAGNOSIS — C61 Malignant neoplasm of prostate: Secondary | ICD-10-CM | POA: Diagnosis not present

## 2022-11-18 LAB — RAD ONC ARIA SESSION SUMMARY
Course Elapsed Days: 41
Plan Fractions Treated to Date: 4
Plan Prescribed Dose Per Fraction: 2 Gy
Plan Total Fractions Prescribed: 15
Plan Total Prescribed Dose: 30 Gy
Reference Point Dosage Given to Date: 8 Gy
Reference Point Session Dosage Given: 2 Gy
Session Number: 29

## 2022-11-19 ENCOUNTER — Ambulatory Visit
Admission: RE | Admit: 2022-11-19 | Discharge: 2022-11-19 | Disposition: A | Discharge status: Home / Self Care | Payer: Medicare Other | Source: Ambulatory Visit | Attending: Radiation Oncology

## 2022-11-19 ENCOUNTER — Other Ambulatory Visit: Payer: Self-pay

## 2022-11-19 DIAGNOSIS — C61 Malignant neoplasm of prostate: Secondary | ICD-10-CM | POA: Diagnosis not present

## 2022-11-19 LAB — RAD ONC ARIA SESSION SUMMARY
Course Elapsed Days: 42
Plan Fractions Treated to Date: 5
Plan Prescribed Dose Per Fraction: 2 Gy
Plan Total Fractions Prescribed: 15
Plan Total Prescribed Dose: 30 Gy
Reference Point Dosage Given to Date: 10 Gy
Reference Point Session Dosage Given: 2 Gy
Session Number: 30

## 2022-11-20 ENCOUNTER — Ambulatory Visit
Admission: RE | Admit: 2022-11-20 | Discharge: 2022-11-20 | Disposition: A | Discharge status: Home / Self Care | Payer: Medicare Other | Source: Ambulatory Visit | Attending: Radiation Oncology | Admitting: Radiation Oncology

## 2022-11-20 ENCOUNTER — Other Ambulatory Visit: Payer: Self-pay

## 2022-11-20 DIAGNOSIS — C61 Malignant neoplasm of prostate: Secondary | ICD-10-CM | POA: Diagnosis not present

## 2022-11-20 LAB — RAD ONC ARIA SESSION SUMMARY
Course Elapsed Days: 43
Plan Fractions Treated to Date: 6
Plan Prescribed Dose Per Fraction: 2 Gy
Plan Total Fractions Prescribed: 15
Plan Total Prescribed Dose: 30 Gy
Reference Point Dosage Given to Date: 12 Gy
Reference Point Session Dosage Given: 2 Gy
Session Number: 31

## 2022-11-21 ENCOUNTER — Ambulatory Visit
Admission: RE | Admit: 2022-11-21 | Discharge: 2022-11-21 | Disposition: A | Discharge status: Home / Self Care | Payer: Medicare Other | Source: Ambulatory Visit | Attending: Radiation Oncology | Admitting: Radiation Oncology

## 2022-11-21 ENCOUNTER — Ambulatory Visit
Admission: RE | Admit: 2022-11-21 | Discharge: 2022-11-21 | Disposition: A | Discharge status: Home / Self Care | Payer: Medicare Other | Source: Ambulatory Visit | Attending: Radiation Oncology

## 2022-11-21 ENCOUNTER — Other Ambulatory Visit: Payer: Self-pay

## 2022-11-21 DIAGNOSIS — C61 Malignant neoplasm of prostate: Secondary | ICD-10-CM | POA: Diagnosis present

## 2022-11-21 LAB — RAD ONC ARIA SESSION SUMMARY
Course Elapsed Days: 44
Plan Fractions Treated to Date: 7
Plan Prescribed Dose Per Fraction: 2 Gy
Plan Total Fractions Prescribed: 15
Plan Total Prescribed Dose: 30 Gy
Reference Point Dosage Given to Date: 14 Gy
Reference Point Session Dosage Given: 2 Gy
Session Number: 32

## 2022-11-24 ENCOUNTER — Other Ambulatory Visit: Payer: Self-pay

## 2022-11-24 ENCOUNTER — Ambulatory Visit
Admission: RE | Admit: 2022-11-24 | Discharge: 2022-11-24 | Disposition: A | Discharge status: Home / Self Care | Payer: Medicare Other | Source: Ambulatory Visit | Attending: Radiation Oncology

## 2022-11-24 DIAGNOSIS — C61 Malignant neoplasm of prostate: Secondary | ICD-10-CM | POA: Diagnosis not present

## 2022-11-24 LAB — RAD ONC ARIA SESSION SUMMARY
Course Elapsed Days: 47
Plan Fractions Treated to Date: 8
Plan Prescribed Dose Per Fraction: 2 Gy
Plan Total Fractions Prescribed: 15
Plan Total Prescribed Dose: 30 Gy
Reference Point Dosage Given to Date: 16 Gy
Reference Point Session Dosage Given: 2 Gy
Session Number: 33

## 2022-11-25 ENCOUNTER — Other Ambulatory Visit: Payer: Self-pay

## 2022-11-25 ENCOUNTER — Ambulatory Visit
Admission: RE | Admit: 2022-11-25 | Discharge: 2022-11-25 | Disposition: A | Discharge status: Home / Self Care | Payer: Medicare Other | Source: Ambulatory Visit | Attending: Radiation Oncology | Admitting: Radiation Oncology

## 2022-11-25 DIAGNOSIS — C61 Malignant neoplasm of prostate: Secondary | ICD-10-CM | POA: Diagnosis not present

## 2022-11-25 LAB — RAD ONC ARIA SESSION SUMMARY
Course Elapsed Days: 48
Plan Fractions Treated to Date: 9
Plan Prescribed Dose Per Fraction: 2 Gy
Plan Total Fractions Prescribed: 15
Plan Total Prescribed Dose: 30 Gy
Reference Point Dosage Given to Date: 18 Gy
Reference Point Session Dosage Given: 2 Gy
Session Number: 34

## 2022-11-26 ENCOUNTER — Other Ambulatory Visit: Payer: Self-pay

## 2022-11-26 ENCOUNTER — Ambulatory Visit
Admission: RE | Admit: 2022-11-26 | Discharge: 2022-11-26 | Disposition: A | Discharge status: Home / Self Care | Payer: Medicare Other | Source: Ambulatory Visit | Attending: Radiation Oncology

## 2022-11-26 DIAGNOSIS — C61 Malignant neoplasm of prostate: Secondary | ICD-10-CM | POA: Diagnosis not present

## 2022-11-26 LAB — RAD ONC ARIA SESSION SUMMARY
Course Elapsed Days: 49
Plan Fractions Treated to Date: 10
Plan Prescribed Dose Per Fraction: 2 Gy
Plan Total Fractions Prescribed: 15
Plan Total Prescribed Dose: 30 Gy
Reference Point Dosage Given to Date: 20 Gy
Reference Point Session Dosage Given: 2 Gy
Session Number: 35

## 2022-11-27 ENCOUNTER — Ambulatory Visit
Admission: RE | Admit: 2022-11-27 | Discharge: 2022-11-27 | Disposition: A | Discharge status: Home / Self Care | Payer: Medicare Other | Source: Ambulatory Visit | Attending: Radiation Oncology | Admitting: Radiation Oncology

## 2022-11-27 ENCOUNTER — Other Ambulatory Visit: Payer: Self-pay

## 2022-11-27 ENCOUNTER — Ambulatory Visit: Payer: Medicare Other

## 2022-11-27 DIAGNOSIS — C61 Malignant neoplasm of prostate: Secondary | ICD-10-CM | POA: Diagnosis not present

## 2022-11-27 LAB — RAD ONC ARIA SESSION SUMMARY
Course Elapsed Days: 50
Plan Fractions Treated to Date: 11
Plan Prescribed Dose Per Fraction: 2 Gy
Plan Total Fractions Prescribed: 15
Plan Total Prescribed Dose: 30 Gy
Reference Point Dosage Given to Date: 22 Gy
Reference Point Session Dosage Given: 2 Gy
Session Number: 36

## 2022-11-28 ENCOUNTER — Ambulatory Visit
Admission: RE | Admit: 2022-11-28 | Discharge: 2022-11-28 | Disposition: A | Discharge status: Home / Self Care | Payer: Medicare Other | Source: Ambulatory Visit | Attending: Radiation Oncology

## 2022-11-28 ENCOUNTER — Ambulatory Visit
Admission: RE | Admit: 2022-11-28 | Discharge: 2022-11-28 | Disposition: A | Discharge status: Home / Self Care | Payer: Medicare Other | Source: Ambulatory Visit | Attending: Radiation Oncology | Admitting: Radiation Oncology

## 2022-11-28 ENCOUNTER — Other Ambulatory Visit: Payer: Self-pay

## 2022-11-28 DIAGNOSIS — C61 Malignant neoplasm of prostate: Secondary | ICD-10-CM | POA: Diagnosis not present

## 2022-11-28 LAB — RAD ONC ARIA SESSION SUMMARY
Course Elapsed Days: 51
Plan Fractions Treated to Date: 12
Plan Prescribed Dose Per Fraction: 2 Gy
Plan Total Fractions Prescribed: 15
Plan Total Prescribed Dose: 30 Gy
Reference Point Dosage Given to Date: 24 Gy
Reference Point Session Dosage Given: 2 Gy
Session Number: 37

## 2022-12-01 ENCOUNTER — Ambulatory Visit
Admission: RE | Admit: 2022-12-01 | Discharge: 2022-12-01 | Disposition: A | Discharge status: Home / Self Care | Payer: Medicare Other | Source: Ambulatory Visit | Attending: Radiation Oncology

## 2022-12-01 ENCOUNTER — Other Ambulatory Visit: Payer: Self-pay

## 2022-12-01 DIAGNOSIS — C61 Malignant neoplasm of prostate: Secondary | ICD-10-CM | POA: Diagnosis not present

## 2022-12-01 LAB — RAD ONC ARIA SESSION SUMMARY
Course Elapsed Days: 54
Plan Fractions Treated to Date: 13
Plan Prescribed Dose Per Fraction: 2 Gy
Plan Total Fractions Prescribed: 15
Plan Total Prescribed Dose: 30 Gy
Reference Point Dosage Given to Date: 26 Gy
Reference Point Session Dosage Given: 2 Gy
Session Number: 38

## 2022-12-02 ENCOUNTER — Ambulatory Visit
Admission: RE | Admit: 2022-12-02 | Discharge: 2022-12-02 | Disposition: A | Discharge status: Home / Self Care | Payer: Medicare Other | Source: Ambulatory Visit | Attending: Radiation Oncology

## 2022-12-02 ENCOUNTER — Ambulatory Visit: Payer: Medicare Other

## 2022-12-02 ENCOUNTER — Other Ambulatory Visit: Payer: Self-pay

## 2022-12-02 DIAGNOSIS — C61 Malignant neoplasm of prostate: Secondary | ICD-10-CM | POA: Diagnosis not present

## 2022-12-02 LAB — RAD ONC ARIA SESSION SUMMARY
Course Elapsed Days: 55
Plan Fractions Treated to Date: 14
Plan Prescribed Dose Per Fraction: 2 Gy
Plan Total Fractions Prescribed: 15
Plan Total Prescribed Dose: 30 Gy
Reference Point Dosage Given to Date: 28 Gy
Reference Point Session Dosage Given: 2 Gy
Session Number: 39

## 2022-12-03 ENCOUNTER — Other Ambulatory Visit: Payer: Self-pay

## 2022-12-03 ENCOUNTER — Other Ambulatory Visit: Payer: Medicare Other

## 2022-12-03 ENCOUNTER — Ambulatory Visit
Admission: RE | Admit: 2022-12-03 | Discharge: 2022-12-03 | Disposition: A | Discharge status: Home / Self Care | Payer: Medicare Other | Source: Ambulatory Visit | Attending: Radiation Oncology | Admitting: Radiation Oncology

## 2022-12-03 ENCOUNTER — Ambulatory Visit: Payer: Medicare Other | Admitting: Hematology

## 2022-12-03 DIAGNOSIS — C61 Malignant neoplasm of prostate: Secondary | ICD-10-CM | POA: Diagnosis not present

## 2022-12-03 LAB — RAD ONC ARIA SESSION SUMMARY
Course Elapsed Days: 56
Plan Fractions Treated to Date: 15
Plan Prescribed Dose Per Fraction: 2 Gy
Plan Total Fractions Prescribed: 15
Plan Total Prescribed Dose: 30 Gy
Reference Point Dosage Given to Date: 30 Gy
Reference Point Session Dosage Given: 2 Gy
Session Number: 40

## 2022-12-04 ENCOUNTER — Telehealth: Payer: Self-pay

## 2022-12-04 NOTE — Telephone Encounter (Signed)
RN  called patient to congratulate completing radiation treatment 12/03/2022 and to let him know what's next.  RN informed patient he will be getting a post treatment call in 30 days just to follow up on how he's been doing since treatment finished and for him to connect with his urologist office for follow up appointments.  And if any urological medications need refill he would get them through his urologist's office.  Patient verbalized understanding and will call his urologist office for follow up appointment.

## 2022-12-04 NOTE — Radiation Completion Notes (Addendum)
  Radiation Oncology         [phone] ________________________________  Name: Sean Green MRN: [id number]  Date: 12/04/2022  DOB: [birth year]  Referring Physician: Bjorn Pippin, M.D. Date of Service: 2022-12-04 Radiation Oncologist: Margaretmary Bayley, M.D. [facility]     RADIATION ONCOLOGY END OF TREATMENT NOTE     Diagnosis: 67 y.o. 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   Intent: Curative     ==========DELIVERED PLANS==========  First Treatment Date: 2022-10-08 - Last Treatment Date: 2022-12-03; concurrent with Eligard and Zytiga/prednisone.    Plan Name: Prostate_Pelv Site: Prostate Technique: IMRT Mode: Photon Dose Per Fraction: 1.8 Gy Prescribed Dose (Delivered / Prescribed): 45 Gy / 45 Gy Prescribed Fxs (Delivered / Prescribed): 25 / 25   Plan Name: Prostate_Bst Site: Prostate Technique: IMRT Mode: Photon Dose Per Fraction: 2 Gy Prescribed Dose (Delivered / Prescribed): 30 Gy / 30 Gy Prescribed Fxs (Delivered / Prescribed): 15 / 15     ==========ON TREATMENT VISIT DATES========== 2022-10-09, 2022-10-16, 2022-10-24, 2022-10-31, 2022-11-06, 2022-11-14, 2022-11-21, 2022-11-28     See weekly On Treatment Notes in Epic for details.  He tolerated the daily radiation treatments relatively well with only mild urinary symptoms and modest fatigue.  The patient will receive a call in about one month from the radiation oncology department. He will continue follow up with his urologist, Dr. Mena Goes, as well.  ------------------------------------------------   Margaretmary Dys, MD [facility]  Radiation Oncology Direct Dial: [phone]  Fax: [phone] [email or web address]  Skype  LinkedIn

## 2022-12-23 ENCOUNTER — Ambulatory Visit
Admission: RE | Admit: 2022-12-23 | Discharge: 2022-12-23 | Disposition: A | Discharge status: Home / Self Care | Payer: Medicare Other | Source: Ambulatory Visit | Attending: Radiation Oncology | Admitting: Radiation Oncology

## 2022-12-23 NOTE — Progress Notes (Signed)
  Radiation Oncology         [phone]) [phone] ________________________________  Name: Sean Green MRN: [id number]  Date of Service: 12/23/2022  DOB: [birth year]  Post Treatment Telephone Note  Diagnosis:  C61 Malignant neoplasm of prostate (as documented in provider EOT note)  Pre Treatment IPSS Score: 13 (as documented in the provider consult note)  The patient was not available for call today. Voicemail left.  Patient has a scheduled follow up visit with his urologist, Dr. Annabell Howells, on 02/2023 for ongoing surveillance. He was counseled that PSA levels will be drawn in the urology office, and was reassured that additional time is expected to improve bowel and bladder symptoms. He was encouraged to call back with concerns or questions regarding radiation.    Ruel Favors, LPN

## 2022-12-26 ENCOUNTER — Other Ambulatory Visit: Payer: Self-pay | Admitting: Urology

## 2022-12-26 DIAGNOSIS — C61 Malignant neoplasm of prostate: Secondary | ICD-10-CM

## 2023-01-12 ENCOUNTER — Encounter: Payer: Self-pay | Admitting: *Deleted

## 2023-01-19 ENCOUNTER — Encounter: Payer: Self-pay | Admitting: *Deleted

## 2023-02-05 ENCOUNTER — Encounter: Payer: Self-pay | Admitting: *Deleted

## 2023-02-05 ENCOUNTER — Inpatient Hospital Stay: Payer: Medicare Other | Attending: Adult Health | Admitting: *Deleted

## 2023-02-05 DIAGNOSIS — C61 Malignant neoplasm of prostate: Secondary | ICD-10-CM

## 2023-02-05 NOTE — Progress Notes (Signed)
SCP reviewed and completed.Most recent PSA was on 12/10/2022, resulted 0.078. Last colonoscopy was 08/02/2021. Next due on 08/03/2026.

## 2023-11-17 ENCOUNTER — Other Ambulatory Visit: Payer: Self-pay | Admitting: Urology

## 2023-12-04 ENCOUNTER — Other Ambulatory Visit (HOSPITAL_COMMUNITY): Payer: Self-pay | Admitting: Family Medicine

## 2023-12-04 ENCOUNTER — Ambulatory Visit (HOSPITAL_COMMUNITY)
Admission: RE | Admit: 2023-12-04 | Discharge: 2023-12-04 | Disposition: A | Discharge status: Home / Self Care | Source: Ambulatory Visit | Attending: Vascular Surgery | Admitting: Vascular Surgery

## 2023-12-04 DIAGNOSIS — M79669 Pain in unspecified lower leg: Secondary | ICD-10-CM | POA: Insufficient documentation

## 2023-12-04 DIAGNOSIS — M7989 Other specified soft tissue disorders: Secondary | ICD-10-CM | POA: Diagnosis present

## 2023-12-04 NOTE — Patient Instructions (Signed)
 SURGICAL WAITING ROOM VISITATION  Patients having surgery or a procedure may have no more than 2 support people in the waiting area - these visitors may rotate.    Children under the age of 63 must have an adult with them who is not the patient.  Visitors with respiratory illnesses are discouraged from visiting and should remain at home.  If the patient needs to stay at the hospital during part of their recovery, the visitor guidelines for inpatient rooms apply. Pre-op nurse will coordinate an appropriate time for 1 support person to accompany patient in pre-op.  This support person may not rotate.    Please refer to the [facility] website for the visitor guidelines for Inpatients (after your surgery is over and you are in a regular room).       Your procedure is scheduled on:  12/15/2023    Report to [facility] Main Entrance    Report to admitting at  0930 AM   Call this number if you have problems the morning of surgery [phone]   Do not eat food :After Midnight.   After Midnight you may have the following liquids until __ 0830____ AM  DAY OF SURGERY  Water Non-Citrus Juices (without pulp, NO RED-Apple, White grape, White cranberry) Black Coffee (NO MILK/CREAM OR CREAMERS, sugar ok)  Clear Tea (NO MILK/CREAM OR CREAMERS, sugar ok) regular and decaf                             Plain Jell-O (NO RED)                                           Fruit ices (not with fruit pulp, NO RED)                                     Popsicles (NO RED)                                                               Sports drinks like Gatorade (NO RED)              If you have questions, please contact your surgeon's office.      Oral Hygiene is also important to reduce your risk of infection.                                    Remember - BRUSH YOUR TEETH THE MORNING OF SURGERY WITH YOUR REGULAR TOOTHPASTE  DENTURES WILL BE REMOVED PRIOR TO SURGERY PLEASE DO NOT APPLY Poly  grip OR ADHESIVES!!!   Do NOT smoke after Midnight   Stop all vitamins and herbal supplements 7 days before surgery.   Take these medicines the morning of surgery with A SIP OF WATER:  amlodiopne, celexa , eye drops if needed, prednisone               Metformin- none am of  surgery   DO NOT TAKE ANY ORAL DIABETIC MEDICATIONS  DAY OF YOUR SURGERY  Bring CPAP mask and tubing day of surgery.                              You may not have any metal on your body including hair pins, jewelry, and body piercing             Do not wear make-up, lotions, powders, perfumes/cologne, or deodorant  Do not wear nail polish including gel and S&S, artificial/acrylic nails, or any other type of covering on natural nails including finger and toenails. If you have artificial nails, gel coating, etc. that needs to be removed by a nail salon please have this removed prior to surgery or surgery may need to be canceled/ delayed if the surgeon/ anesthesia feels like they are unable to be safely monitored.   Do not shave  48 hours prior to surgery.               Men may shave face and neck.   Do not bring valuables to the hospital. [facility] IS NOT             RESPONSIBLE   FOR VALUABLES.   Contacts, glasses, dentures or bridgework may not be worn into surgery.   Bring small overnight bag day of surgery.   DO NOT BRING YOUR HOME MEDICATIONS TO THE HOSPITAL. PHARMACY WILL DISPENSE MEDICATIONS LISTED ON YOUR MEDICATION LIST TO YOU DURING YOUR ADMISSION IN THE HOSPITAL!    Patients discharged on the day of surgery will not be allowed to drive home.  Someone NEEDS to stay with you for the first 24 hours after anesthesia.   Special Instructions: Bring a copy of your healthcare power of attorney and living will documents the day of surgery if you haven't scanned them before.              Please read over the following fact sheets you were given: IF YOU HAVE QUESTIONS ABOUT YOUR PRE-OP INSTRUCTIONS PLEASE CALL  [phone].   If you received a COVID test during your pre-op visit  it is requested that you wear a mask when out in public, stay away from anyone that may not be feeling well and notify your surgeon if you develop symptoms. If you test positive for Covid or have been in contact with anyone that has tested positive in the last 10 days please notify you surgeon.    [place] - Preparing for Surgery Before surgery, you can play an important role.  Because skin is not sterile, your skin needs to be as free of germs as possible.  You can reduce the number of germs on your skin by washing with CHG (chlorahexidine gluconate) soap before surgery.  CHG is an antiseptic cleaner which kills germs and bonds with the skin to continue killing germs even after washing. Please DO NOT use if you have an allergy to CHG or antibacterial soaps.  If your skin becomes reddened/irritated stop using the CHG and inform your nurse when you arrive at Short Stay. Do not shave (including legs and underarms) for at least 48 hours prior to the first CHG shower.  You may shave your face/neck. Please follow these instructions carefully:  1.  Shower with CHG Soap the night before surgery and the  morning of Surgery.  2.  If you choose to wash your hair, wash your hair first as usual with your  normal  shampoo.  3.  After you shampoo, rinse your hair and body thoroughly to remove the  shampoo.                           4.  Use CHG as you would any other liquid soap.  You can apply chg directly  to the skin and wash                       Gently with a scrungie or clean washcloth.  5.  Apply the CHG Soap to your body ONLY FROM THE NECK DOWN.   Do not use on face/ open                           Wound or open sores. Avoid contact with eyes, ears mouth and genitals (private parts).                       Wash face,  Genitals (private parts) with your normal soap.             6.  Wash thoroughly, paying special attention to the area where  your surgery  will be performed.  7.  Thoroughly rinse your body with warm water from the neck down.  8.  DO NOT shower/wash with your normal soap after using and rinsing off  the CHG Soap.                9.  Pat yourself dry with a clean towel.            10.  Wear clean pajamas.            11.  Place clean sheets on your bed the night of your first shower and do not  sleep with pets. Day of Surgery : Do not apply any lotions/deodorants the morning of surgery.  Please wear clean clothes to the hospital/surgery center.  FAILURE TO FOLLOW THESE INSTRUCTIONS MAY RESULT IN THE CANCELLATION OF YOUR SURGERY PATIENT SIGNATURE_________________________________  NURSE SIGNATURE__________________________________  ________________________________________________________________________

## 2023-12-04 NOTE — Progress Notes (Addendum)
 Anesthesia Review:  ERE:Xjuyzmpwz [place]   Beverley morrow LOV 12/04/23 for lower leg swelling per pt has not yet picked up script for fluid pills per pt as of 12/07/23.  Cardiologist :  [facility] LOV  12/26/2020  Hx of Pericarditis   PPM/ ICD: Device Orders: Rep Notified:  Chest x-ray : [facility] studies- 12/04/23  EKG : 12/07/23  Echo : Stress test: 2022  Cardiac Cath :   Activity level: cannot do a flight of stairs without difficutly  Sleep Study/ CPAP : none  Fasting Blood Sugar :      / Checks Blood Sugar -- times a day:   DM- since lost 46 lbs a year ago is no longer a diabetic per pt  Hgba1c-  12/07/23- 5.8  Metformin- none am of surgery   Blood Thinner/ Instructions /Last Dose: ASA / Instructions/ Last Dose :   81 mg aspirin     Pt aware to call preop nurse back with name of fluid pill he has not started yet when he starts taking.  Pt voiced understanding.     PT called and LVMM for preop nurse on 12/14/23.  Pt states on message the swelling in his legs have not gone down and he has been on Furosemide 20 mg daily.  Called pt back and instructed him to call surgeon office when he hung up th ephone and let them be aware of this and if they decide to proceed with surgery to not take furosemide am of surgery. PT voiced understanding. Suanne Odis BARRE aware .  Called pt per [place] and told him to also call his PCP .  PT voiced understanding.

## 2023-12-07 ENCOUNTER — Encounter (HOSPITAL_COMMUNITY)
Admission: RE | Admit: 2023-12-07 | Discharge: 2023-12-07 | Disposition: A | Discharge status: Home / Self Care | Source: Ambulatory Visit | Attending: Urology | Admitting: Urology

## 2023-12-07 ENCOUNTER — Other Ambulatory Visit: Payer: Self-pay

## 2023-12-07 ENCOUNTER — Encounter (HOSPITAL_COMMUNITY): Payer: Self-pay

## 2023-12-07 VITALS — BP 140/80 | HR 84 | Temp 98.7°F | Resp 16 | Ht 74.0 in | Wt 263.0 lb

## 2023-12-07 DIAGNOSIS — I10 Essential (primary) hypertension: Secondary | ICD-10-CM | POA: Insufficient documentation

## 2023-12-07 DIAGNOSIS — Z87891 Personal history of nicotine dependence: Secondary | ICD-10-CM | POA: Diagnosis not present

## 2023-12-07 DIAGNOSIS — E139 Other specified diabetes mellitus without complications: Secondary | ICD-10-CM | POA: Diagnosis not present

## 2023-12-07 DIAGNOSIS — F419 Anxiety disorder, unspecified: Secondary | ICD-10-CM | POA: Diagnosis not present

## 2023-12-07 DIAGNOSIS — F329 Major depressive disorder, single episode, unspecified: Secondary | ICD-10-CM | POA: Diagnosis not present

## 2023-12-07 DIAGNOSIS — N138 Other obstructive and reflux uropathy: Secondary | ICD-10-CM | POA: Diagnosis not present

## 2023-12-07 DIAGNOSIS — Z7984 Long term (current) use of oral hypoglycemic drugs: Secondary | ICD-10-CM | POA: Insufficient documentation

## 2023-12-07 DIAGNOSIS — E1340 Other specified diabetes mellitus with diabetic neuropathy, unspecified: Secondary | ICD-10-CM | POA: Insufficient documentation

## 2023-12-07 DIAGNOSIS — M1990 Unspecified osteoarthritis, unspecified site: Secondary | ICD-10-CM | POA: Diagnosis not present

## 2023-12-07 DIAGNOSIS — N401 Enlarged prostate with lower urinary tract symptoms: Secondary | ICD-10-CM | POA: Diagnosis not present

## 2023-12-07 DIAGNOSIS — I2510 Atherosclerotic heart disease of native coronary artery without angina pectoris: Secondary | ICD-10-CM | POA: Diagnosis not present

## 2023-12-07 DIAGNOSIS — Z01818 Encounter for other preprocedural examination: Secondary | ICD-10-CM | POA: Insufficient documentation

## 2023-12-07 LAB — BASIC METABOLIC PANEL WITH GFR
Anion gap: 11 (ref 5–15)
BUN: 20 mg/dL (ref 8–23)
CO2: 28 mmol/L (ref 22–32)
Calcium: 9.3 mg/dL (ref 8.9–10.3)
Chloride: 103 mmol/L (ref 98–111)
Creatinine, Ser: 0.88 mg/dL (ref 0.61–1.24)
GFR, Estimated: >60 mL/min (ref >60)
Glucose, Bld: 153 mg/dL — ABNORMAL HIGH (ref 70–99)
Potassium: 3.6 mmol/L (ref 3.5–5.1)
Sodium: 141 mmol/L (ref 135–145)

## 2023-12-07 LAB — CBC
HCT: 38.2 % — ABNORMAL LOW (ref 39.0–52.0)
Hemoglobin: 12.4 g/dL — ABNORMAL LOW (ref 13.0–17.0)
MCH: 29.3 pg (ref 26.0–34.0)
MCHC: 32.5 g/dL (ref 30.0–36.0)
MCV: 90.3 fL (ref 80.0–100.0)
Platelets: 257 K/uL (ref 150–400)
RBC: 4.23 MIL/uL (ref 4.22–5.81)
RDW: 14.4 % (ref 11.5–15.5)
WBC: 8.1 K/uL (ref 4.0–10.5)
nRBC: 0 % (ref 0.0–0.2)

## 2023-12-07 LAB — HEMOGLOBIN A1C
Hgb A1c MFr Bld: 5.8 % — ABNORMAL HIGH (ref 4.8–5.6)
Mean Plasma Glucose: 119.76 mg/dL

## 2023-12-09 ENCOUNTER — Encounter (HOSPITAL_COMMUNITY): Payer: Self-pay

## 2023-12-09 NOTE — Progress Notes (Signed)
 Case: [id number] Date/Time: 12/15/23 1119   Procedure: CYSTOSCOPY WITH INSERTION OF UROLIFT   Anesthesia type: General   Diagnosis: Enlarged prostate with urinary obstruction [N40.1, N13.8]   Pre-op diagnosis: BENIGN PROSTATIC HYPERPLASIA   Location: [facility] ROOM 03 / [facility]   Surgeons: Nieves Cough, MD       DISCUSSION: Sean Green is a 68 yo male with PMH of former smoking, HTN, nonobstructive CAD by cath (2011), prediabetes, neuropathy, anxiety, depression, arthritis, prostate cancer with mets s/p XRT (08/2022), hx of ACDF R5-R2(7986)  Prior complications from anesthesia includes PONV, prolonged emergence after hip surgery  Evaluated by Cardiology in 2022 due to chest pain. Stress test was done in 11/2020 and was low risk. Patient also with hx of viral pericarditis in 2011 which ultimately lead to cardiac cath. Cath showed nonobstructive CAD.  Seen by PCP on 12/04/23 for leg swelling. He underwent DVT study which was negative for DVT. He was prescribed a fluid pill per patient. Symptoms also possibly due to Amlodipine .  VS: BP (!) 140/80   Pulse 84   Temp 37.1 C (Oral)   Resp 16   Ht 6' 2 (1.88 m)   Wt 119.3 kg   SpO2 97%   BMI 33.77 kg/m   PROVIDERS: Verena Mems, MD   LABS: Labs reviewed: Acceptable for surgery. (all labs ordered are listed, but only abnormal results are displayed)  Labs Reviewed  HEMOGLOBIN A1C - Abnormal; Notable for the following components:      Result Value   Hgb A1c MFr Bld 5.8 (*)    All other components within normal limits  BASIC METABOLIC PANEL WITH GFR - Abnormal; Notable for the following components:   Glucose, Bld 153 (*)    All other components within normal limits  CBC - Abnormal; Notable for the following components:   Hemoglobin 12.4 (*)    HCT 38.2 (*)    All other components within normal limits    PET scan 06/23/22:   IMPRESSION: 1. Focal PSMA radiotracer activity within the posterior LEFT lobe of the prostate gland  consistent with primary prostate adenocarcinoma. 2. Single radiotracer avid pelvic lymph node in the posterior LEFT operator space is most consistent with prostate cancer nodal metastasis 3. Intense radiotracer activity associated with a very small LEFT inguinal lymph node. While a typical site for prostate cancer nodal metastasis, there is a high suspicion for metastatic disease due to the intense activity for small size. 4. No evidence of metastatic disease in lymph nodes outside the pelvis. 5. No visceral metastasis or skeletal metastasis  EKG 12/07/23:  Normal sinus rhythm Minimal voltage criteria for LVH, may be normal variant ( R in aVL ) Borderline ECG  Stress test 11/23/2020:    The study is normal. The study is low risk.   No ST deviation was noted.   LV perfusion is normal. There is no evidence of ischemia. There is no evidence of infarction.   Left ventricular function is normal. Nuclear stress EF: 58 %. The left ventricular ejection fraction is normal (55-65%). End diastolic cavity size is normal. End systolic cavity size is normal.   Prior study not available for comparison.  Echo 03/19/2009:  Study Conclusions  - Left ventricle: The cavity size was normal. Wall thickness was increased in a pattern of mild LVH. Systolic function was normal. The estimated ejection fraction was in the range of 55% to 60%. - Mitral valve: Mild regurgitation. - Pulmonary arteries: PA peak pressure: 36mm Hg (S). Transthoracic  echocardiography. M-mode, complete 2D, spectral Doppler, and color Doppler. Height: Height: 188cm. Height: 74in. Weight: Weight: 119.8kg. Weight: 263.5lb. Body mass index: BMI: 33.9kg/m^2. Body surface area: BSA: 2.85m^2. Blood pressure: 136/77. Patient status: Inpatient. Location: Bedside.  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 santo;   cardiac cath done 02-27-201  for elevated cardiac enzymes/ ? ekg, dx viral pericarditis, per cardiac cath proxRCA 30% stenosis ;   nuclear stress test 11-23-2020  normal perfusion w/ no ischemia, nuclear ef 58%   ED (erectile dysfunction)    Family history of adverse reaction to anesthesia    sister--- ponv   GAD (generalized anxiety disorder)    History of kidney stones    History of viral pericarditis 03/18/2009   admission in epic;   in setting elevated cardiac enzymes/ chest pain/ ? ekg, s/p cath w/ nonob cad   Hyperlipidemia    Hypertension    Malignant neoplasm prostate [facility]) 05/2022   urologist--- dr wrenn/  oncologist--- dr feng/  radiation conologist--- dr patrcia;   dx 05/ 2024,  Gleason 4+4,  Stage IV w/  pelvic node mets   MDD (major depressive disorder)    Nodular prostate with lower urinary tract symptoms    OA (osteoarthritis)    Peripheral neuropathy    Peyronie's disease    Pneumonia    PONV (postoperative nausea and vomiting)    Primary male hypogonadism     Past Surgical History:  Procedure Laterality Date   ANTERIOR CERVICAL DECOMP/DISCECTOMY FUSION  06/25/2011   Procedure: ANTERIOR CERVICAL DECOMPRESSION/DISCECTOMY FUSION 3 LEVELS;  Surgeon: Lamar LELON Peaches, MD;  [facility];  Service: Neurosurgery;  Laterality: N/A;  Cervical Four-Five Cervical Five-Six Cervical Six-Seven Anterior Cervical Decompression with Fusion plating and bonegraft   BUNIONECTOMY Left 01/15/2022   @[facility]  by dr n. regal     (per pt has retained hardware)   CARDIAC CATHETERIZATION  03/18/2009   @[facility]  by dr cooper;  LVEF 55% w/ very mild hypokinesis in the distal anterolateral wall;  30% pRCA,   dLAD bridging   CARPAL TUNNEL RELEASE Right 1991    EXTRACORPOREAL SHOCK WAVE LITHOTRIPSY Right 12/17/2015   @[facility]    GOLD SEED IMPLANT N/A 09/12/2022   Procedure: GOLD SEED IMPLANT;  Surgeon: Watt Rush, MD;  Location: [facility];  Service: Urology;  Laterality: N/A;   LUMBAR LAMINECTOMY/DECOMPRESSION MICRODISCECTOMY Left 09/07/2013   Procedure: LAMINECTOMY L5 - S1 ON THE LEFT 1 LEVEL;  Surgeon: Donaciano Sprang, MD;  Location[facility];  Service: Orthopedics;  Laterality: Left;   SHOULDER ARTHROSCOPY DISTAL CLAVICLE EXCISION AND OPEN ROTATOR CUFF REPAIR Left    SHOULDER ARTHROSCOPY W/ ROTATOR CUFF REPAIR Right 2002   SPACE OAR INSTILLATION N/A 09/12/2022   Procedure: SPACE OAR INSTILLATION;  Surgeon: Watt Rush, MD;  Location: [facility];  Service: Urology;  Laterality: N/A;   TOTAL HIP ARTHROPLASTY Right 2008    MEDICATIONS:  furosemide (LASIX) 20 MG tablet   abiraterone  acetate (ZYTIGA ) 250 MG tablet   amLODipine  (NORVASC ) 10 MG tablet   Ascorbic Acid (VITAMIN C) 1000 MG tablet   aspirin EC  81 MG tablet   cholecalciferol (VITAMIN D3) 25 MCG (1000 UNIT) tablet   citalopram  (CELEXA ) 40 MG tablet   Cyanocobalamin (B-12) 250 MCG TABS   Magnesium  250 MG TABS   Melatonin 10 MG CAPS   metFORMIN (GLUCOPHAGE-XR) 500 MG 24 hr tablet   MYRBETRIQ 50 MG TB24 tablet   Olopatadine  HCl 0.2 % SOLN   oxymetazoline (AFRIN) 0.05 % nasal spray   Potassium Chloride  ER 20 MEQ TBCR   predniSONE  (DELTASONE ) 5 MG tablet   relugolix (ORGOVYX) 120 MG tablet   rosuvastatin (CRESTOR) 5 MG tablet   Turmeric Curcumin 500 MG CAPS   valsartan (DIOVAN) 160 MG tablet   zinc gluconate 50 MG tablet   No current facility-administered medications for this encounter.   Burnard CHRISTELLA Odis DEVONNA [facility] Short Stay/Anesthesiology [facility] Phone [phone] 12/09/2023 9:38 AM

## 2023-12-09 NOTE — Anesthesia Preprocedure Evaluation (Addendum)
 Anesthesia Evaluation  Patient identified by MRN, date of birth, ID band Patient awake    Reviewed: Allergy & Precautions, NPO status , Patient's Chart, lab work & pertinent test results  History of Anesthesia Complications (+) PONV and history of anesthetic complications  Airway Mallampati: II  TM Distance: >3 FB Neck ROM: Full    Dental  (+) Teeth Intact, Dental Advisory Given   Pulmonary former smoker   breath sounds clear to auscultation       Cardiovascular hypertension, + CAD   Rhythm:Regular Rate:Normal     Neuro/Psych  PSYCHIATRIC DISORDERS Anxiety Depression       GI/Hepatic ,GERD  ,,  Endo/Other    Renal/GU    Prostate Cancer with Metastatic Lymph Nodes    Musculoskeletal  (+) Arthritis ,    Abdominal   Peds  Hematology   Anesthesia Other Findings   Reproductive/Obstetrics                              Anesthesia Physical Anesthesia Plan  ASA: 3  Anesthesia Plan: General   Post-op Pain Management:    Induction: Intravenous  PONV Risk Score and Plan: 3 and Treatment may vary due to age or medical condition  Airway Management Planned: Oral ETT  Additional Equipment:   Intra-op Plan:   Post-operative Plan: Extubation in OR  Informed Consent:      Dental advisory given  Plan Discussed with: CRNA  Anesthesia Plan Comments: (See PAT note from 11/17)         Anesthesia Quick Evaluation

## 2023-12-15 ENCOUNTER — Encounter (HOSPITAL_COMMUNITY)
Admission: RE | Disposition: A | Discharge status: Home / Self Care | Payer: Self-pay | Source: Ambulatory Visit | Attending: Urology

## 2023-12-15 ENCOUNTER — Other Ambulatory Visit: Payer: Self-pay

## 2023-12-15 ENCOUNTER — Encounter (HOSPITAL_COMMUNITY): Payer: Self-pay | Admitting: Urology

## 2023-12-15 ENCOUNTER — Other Ambulatory Visit (HOSPITAL_COMMUNITY): Payer: Self-pay

## 2023-12-15 ENCOUNTER — Ambulatory Visit (HOSPITAL_COMMUNITY): Payer: Self-pay | Admitting: Medical

## 2023-12-15 ENCOUNTER — Ambulatory Visit (HOSPITAL_COMMUNITY)
Admission: RE | Admit: 2023-12-15 | Discharge: 2023-12-15 | Disposition: A | Discharge status: Home / Self Care | Source: Ambulatory Visit | Attending: Urology | Admitting: Urology

## 2023-12-15 ENCOUNTER — Ambulatory Visit (HOSPITAL_COMMUNITY): Admitting: Registered Nurse

## 2023-12-15 DIAGNOSIS — C61 Malignant neoplasm of prostate: Secondary | ICD-10-CM | POA: Diagnosis not present

## 2023-12-15 DIAGNOSIS — I2510 Atherosclerotic heart disease of native coronary artery without angina pectoris: Secondary | ICD-10-CM | POA: Diagnosis not present

## 2023-12-15 DIAGNOSIS — N138 Other obstructive and reflux uropathy: Secondary | ICD-10-CM | POA: Insufficient documentation

## 2023-12-15 DIAGNOSIS — F419 Anxiety disorder, unspecified: Secondary | ICD-10-CM | POA: Insufficient documentation

## 2023-12-15 DIAGNOSIS — N401 Enlarged prostate with lower urinary tract symptoms: Secondary | ICD-10-CM | POA: Insufficient documentation

## 2023-12-15 DIAGNOSIS — Z87891 Personal history of nicotine dependence: Secondary | ICD-10-CM | POA: Diagnosis not present

## 2023-12-15 DIAGNOSIS — Z79899 Other long term (current) drug therapy: Secondary | ICD-10-CM | POA: Diagnosis not present

## 2023-12-15 DIAGNOSIS — I10 Essential (primary) hypertension: Secondary | ICD-10-CM

## 2023-12-15 DIAGNOSIS — F32A Depression, unspecified: Secondary | ICD-10-CM | POA: Diagnosis not present

## 2023-12-15 DIAGNOSIS — Z01818 Encounter for other preprocedural examination: Secondary | ICD-10-CM

## 2023-12-15 HISTORY — PX: CYSTOSCOPY WITH INSERTION OF UROLIFT: SHX6678

## 2023-12-15 LAB — GLUCOSE, CAPILLARY
Glucose-Capillary: 112 mg/dL — ABNORMAL HIGH (ref 70–99)
Glucose-Capillary: 97 mg/dL (ref 70–99)

## 2023-12-15 SURGERY — CYSTOSCOPY WITH INSERTION OF UROLIFT
Anesthesia: General

## 2023-12-15 MED ORDER — ACETAMINOPHEN 10 MG/ML IV SOLN: 1000.0000 mg | Freq: Once | INTRAVENOUS | Status: DC | PRN

## 2023-12-15 MED ORDER — ORAL CARE MOUTH RINSE: 15.0000 mL | Freq: Once | OROMUCOSAL | Status: AC

## 2023-12-15 MED ORDER — FENTANYL CITRATE (PF) 100 MCG/2ML IJ SOLN
INTRAMUSCULAR | Status: DC | PRN
  Administered 2023-12-15 (×2): 25 ug via INTRAVENOUS
  Administered 2023-12-15: 50 ug via INTRAVENOUS

## 2023-12-15 MED ORDER — LIDOCAINE HCL (PF) 2 % IJ SOLN
INTRAMUSCULAR | Status: AC
Start: 2023-12-15 — End: 2023-12-15
  Filled 2023-12-15: qty 5

## 2023-12-15 MED ORDER — AMISULPRIDE (ANTIEMETIC) 5 MG/2ML IV SOLN: 10.0000 mg | Freq: Once | INTRAVENOUS | Status: DC | PRN

## 2023-12-15 MED ORDER — NITROFURANTOIN MONOHYD MACRO 100 MG PO CAPS
100.0000 mg | ORAL_CAPSULE | Freq: Every day | ORAL | 0 refills | Status: AC
  Filled 2023-12-15: qty 10, 10d supply, fill #0

## 2023-12-15 MED ORDER — CEFAZOLIN SODIUM-DEXTROSE 2-4 GM/100ML-% IV SOLN
2.0000 g | INTRAVENOUS | Status: AC
  Administered 2023-12-15: 2 g via INTRAVENOUS
  Filled 2023-12-15: qty 100

## 2023-12-15 MED ORDER — OXYCODONE HCL 5 MG/5ML PO SOLN: 5.0000 mg | Freq: Once | ORAL | Status: DC | PRN

## 2023-12-15 MED ORDER — LIDOCAINE HCL (PF) 2 % IJ SOLN
INTRAMUSCULAR | Status: DC | PRN
  Administered 2023-12-15: 100 mg via INTRADERMAL

## 2023-12-15 MED ORDER — MIDAZOLAM HCL 2 MG/2ML IJ SOLN
INTRAMUSCULAR | Status: AC
Start: 2023-12-15 — End: 2023-12-15
  Filled 2023-12-15: qty 2

## 2023-12-15 MED ORDER — INSULIN ASPART 100 UNIT/ML IJ SOLN: 0.0000 [IU] | INTRAMUSCULAR | Status: DC | PRN

## 2023-12-15 MED ORDER — DEXAMETHASONE SOD PHOSPHATE PF 10 MG/ML IJ SOLN
INTRAMUSCULAR | Status: DC | PRN
  Administered 2023-12-15: 10 mg via INTRAVENOUS

## 2023-12-15 MED ORDER — PROPOFOL 10 MG/ML IV BOLUS
INTRAVENOUS | Status: DC | PRN
  Administered 2023-12-15: 100 mg via INTRAVENOUS
  Administered 2023-12-15: 50 mg via INTRAVENOUS
  Administered 2023-12-15: 150 mg via INTRAVENOUS

## 2023-12-15 MED ORDER — ONDANSETRON HCL 4 MG/2ML IJ SOLN: 4.0000 mg | Freq: Once | INTRAMUSCULAR | Status: DC | PRN

## 2023-12-15 MED ORDER — OXYCODONE HCL 5 MG PO TABS: 5.0000 mg | ORAL_TABLET | Freq: Once | ORAL | Status: DC | PRN

## 2023-12-15 MED ORDER — PROPOFOL 10 MG/ML IV BOLUS
INTRAVENOUS | Status: AC
Start: 2023-12-15 — End: 2023-12-15
  Filled 2023-12-15: qty 20

## 2023-12-15 MED ORDER — LACTATED RINGERS IV SOLN: INTRAVENOUS | Status: DC

## 2023-12-15 MED ORDER — PROPOFOL 10 MG/ML IV BOLUS
INTRAVENOUS | Status: AC
  Filled 2023-12-15: qty 20

## 2023-12-15 MED ORDER — CHLORHEXIDINE GLUCONATE 0.12 % MT SOLN
15.0000 mL | Freq: Once | OROMUCOSAL | Status: AC
Start: 2023-12-15 — End: 2023-12-15
  Administered 2023-12-15: 15 mL via OROMUCOSAL

## 2023-12-15 MED ORDER — FENTANYL CITRATE (PF) 100 MCG/2ML IJ SOLN
INTRAMUSCULAR | Status: AC
  Filled 2023-12-15: qty 2

## 2023-12-15 MED ORDER — STERILE WATER FOR IRRIGATION IR SOLN
Status: DC | PRN
  Administered 2023-12-15: 3000 mL

## 2023-12-15 MED ORDER — MIDAZOLAM HCL 5 MG/5ML IJ SOLN
INTRAMUSCULAR | Status: DC | PRN
  Administered 2023-12-15: 2 mg via INTRAVENOUS

## 2023-12-15 MED ORDER — ONDANSETRON HCL 4 MG/2ML IJ SOLN
INTRAMUSCULAR | Status: AC
  Filled 2023-12-15: qty 2

## 2023-12-15 MED ORDER — FENTANYL CITRATE (PF) 50 MCG/ML IJ SOSY: 25.0000 ug | PREFILLED_SYRINGE | INTRAMUSCULAR | Status: DC | PRN

## 2023-12-15 MED ORDER — ONDANSETRON HCL 4 MG/2ML IJ SOLN
INTRAMUSCULAR | Status: DC | PRN
  Administered 2023-12-15: 4 mg via INTRAVENOUS

## 2023-12-15 SURGICAL SUPPLY — 17 items
BAG COUNTER SPONGE SURGICOUNT (BAG) IMPLANT
BAG URINE DRAIN 2000ML AR STRL (UROLOGICAL SUPPLIES) ×1 IMPLANT
BAG URO CATCHER STRL LF (MISCELLANEOUS) ×1 IMPLANT
CATH FOLEY 2WAY SLVR 5CC 18FR (CATHETERS) ×1 IMPLANT
CATH SILICONE 16FRX5CC (CATHETERS) IMPLANT
GLOVE SURG LX STRL 8.0 MICRO (GLOVE) ×1 IMPLANT
GLOVE SURG SS PI 8.0 STRL IVOR (GLOVE) IMPLANT
GOWN STRL REUS W/ TWL XL LVL3 (GOWN DISPOSABLE) ×2 IMPLANT
HOLDER FOLEY CATH W/STRAP (MISCELLANEOUS) ×1 IMPLANT
KIT TURNOVER KIT A (KITS) ×1 IMPLANT
MANIFOLD NEPTUNE II (INSTRUMENTS) ×1 IMPLANT
PACK CYSTO (CUSTOM PROCEDURE TRAY) ×1 IMPLANT
PENCIL SMOKE EVACUATOR (MISCELLANEOUS) IMPLANT
SYSTEM UROLIFT 2 CART W/ HNDL (Male Continence) IMPLANT
SYSTEM UROLIFT 2 CARTRIDGE (Male Continence) IMPLANT
TUBING CONNECTING 10 (TUBING) ×1 IMPLANT
WATER STERILE IRR 3000ML UROMA (IV SOLUTION) ×2 IMPLANT

## 2023-12-15 NOTE — Anesthesia Procedure Notes (Signed)
 Procedure Name: LMA Insertion Date/Time: 12/15/2023 12:32 PM  Performed by: Memory Armida LABOR, CRNAPre-anesthesia Checklist: Patient identified, Emergency Drugs available, Suction available, Patient being monitored and Timeout performed Patient Re-evaluated:Patient Re-evaluated prior to induction Oxygen Delivery Method: Circle system utilized Preoxygenation: Pre-oxygenation with 100% oxygen Induction Type: IV induction Ventilation: Mask ventilation without difficulty LMA: LMA with gastric port inserted LMA Size: 4.0 Number of attempts: 1 Placement Confirmation: positive ETCO2 and breath sounds checked- equal and bilateral Tube secured with: Tape Dental Injury: Teeth and Oropharynx as per pre-operative assessment

## 2023-12-15 NOTE — Discharge Instructions (Signed)
 If you would like to try it, remove the catheter as instructed on Monday morning December 21, 2023.  Make sure all the water  is removed from the balloon.

## 2023-12-15 NOTE — Transfer of Care (Signed)
 Immediate Anesthesia Transfer of Care Note  Patient: Sean Green  Procedure(s) Performed: CYSTOSCOPY WITH INSERTION OF UROLIFT  Patient Location: PACU  Anesthesia Type:General  Level of Consciousness: awake, alert , oriented, and patient cooperative  Airway & Oxygen Therapy: Patient Spontanous Breathing and Patient connected to face mask oxygen  Post-op Assessment: Report given to RN, Post -op Vital signs reviewed and stable, and Patient moving all extremities  Post vital signs: Reviewed and stable  Last Vitals:  Vitals Value Taken Time  BP 161/104 12/15/23 13:34  Temp    Pulse 75 12/15/23 13:36  Resp 11 12/15/23 13:36  SpO2 100 % 12/15/23 13:36  Vitals shown include unfiled device data.  Last Pain:  Vitals:   12/15/23 0957  TempSrc: Oral  PainSc:          Complications: No notable events documented.

## 2023-12-15 NOTE — Progress Notes (Addendum)
 Short Stay Nursing Note: Client presents for preop with Dr. Nieves, MD. Patient states he recently began taking PO Lasix. Presents with bilateral leg swelling, Left greater then Right. 1-2+ pitting edema noted bilaterally, sock creases easily noted, Rt and Lt pedal and post tib pulses noted and marked. Denies any pain when ambulating. Negative Homans sign noted bilaterally, denies DOE, PND or Orthopnea. S1S2 easily noted at all 4 sites. BS in all fields seem decreased in both lung fields, denies any coughs. Denies chest pain, at rest or with exertion. MD and Anesthesia informed. Will speak with MDA regarding above pt presentation and for preop additional instructions.

## 2023-12-15 NOTE — H&P (Signed)
 H&P  Chief Complaint: BPH, incomplete emptying   History of Present Illness: 68 year old male with symptomatic BPH.  He had a 54 to 80 g prostate on imaging with an IPSS of 25 and a PVR of 326.  He completed external beam radiation in November 2024 for prostate cancer.  Recent PSA undetectable.  Urodynamic showed a large capacity bladder with a max flow of 9 cc/s and a detrusor pressure of 86 cm of water .  Cystoscopy revealed lateral lobe obstruction.  He presents today for prostatic urethral lift. He has LE edema L>R for a few weeks. Dopplers negative. Increased lasix. Having incontinence, enuresis at night. No CP or SOB. No dysuria or gross hematuria.  No fever or congestion.  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 santo;   cardiac cath done 02-27-201  for elevated cardiac enzymes/ ? ekg, dx viral pericarditis, per cardiac cath proxRCA 30% stenosis ;   nuclear stress test 11-23-2020  normal perfusion w/ no ischemia, nuclear ef 58%   ED (erectile dysfunction)    Family history of adverse reaction to anesthesia    sister--- ponv   GAD (generalized anxiety disorder)    History of kidney stones    History of viral pericarditis 03/18/2009   admission in epic;   in setting elevated cardiac enzymes/ chest pain/ ? ekg, s/p cath w/ nonob cad   Hyperlipidemia    Hypertension    Malignant neoplasm prostate [facility]) 05/2022   urologist--- dr wrenn/  oncologist--- dr feng/  radiation conologist--- dr patrcia;   dx 05/ 2024,  Gleason 4+4,  Stage IV w/  pelvic node mets   MDD (major depressive disorder)    Nodular prostate with lower urinary  tract symptoms    OA (osteoarthritis)    Peripheral neuropathy    Peyronie's disease    Pneumonia    PONV (postoperative nausea and vomiting)    Primary male hypogonadism    Past Surgical History:  Procedure Laterality Date   ANTERIOR CERVICAL DECOMP/DISCECTOMY FUSION  06/25/2011   Procedure: ANTERIOR CERVICAL DECOMPRESSION/DISCECTOMY FUSION 3 LEVELS;  Surgeon: Lamar LELON Peaches, MD;  [facility];  Service: Neurosurgery;  Laterality: N/A;  Cervical Four-Five Cervical Five-Six Cervical Six-Seven Anterior Cervical Decompression with Fusion plating and bonegraft   BUNIONECTOMY Left 01/15/2022   @[facility]  by dr n. regal     (per pt has retained hardware)   CARDIAC CATHETERIZATION  03/18/2009   @[facility]  by dr cooper;  LVEF 55% w/ very mild hypokinesis in the distal anterolateral wall;  30% pRCA,   dLAD bridging   CARPAL TUNNEL RELEASE Right 1991   EXTRACORPOREAL SHOCK WAVE LITHOTRIPSY Right 12/17/2015   @[facility]    GOLD SEED IMPLANT N/A 09/12/2022   Procedure: GOLD SEED IMPLANT;  Surgeon: Watt Rush, MD;  Location: [facility];  Service: Urology;  Laterality: N/A;   LUMBAR LAMINECTOMY/DECOMPRESSION MICRODISCECTOMY Left 09/07/2013   Procedure: LAMINECTOMY L5 - S1 ON THE LEFT 1 LEVEL;  Surgeon: Donaciano Sprang, MD;  [facility];  Service: Orthopedics;  Laterality: Left;   SHOULDER ARTHROSCOPY DISTAL CLAVICLE EXCISION AND OPEN ROTATOR CUFF REPAIR Left  SHOULDER ARTHROSCOPY W/ ROTATOR CUFF REPAIR Right 2002   SPACE OAR INSTILLATION N/A 09/12/2022   Procedure: SPACE OAR INSTILLATION;  Surgeon: Watt Rush, MD;  Location: [facility];  Service: Urology;  Laterality: N/A;   TOTAL HIP ARTHROPLASTY Right 2008    Home Medications:  Medications Prior to Admission  Medication Sig Dispense Refill Last Dose/Taking   abiraterone  acetate (ZYTIGA ) 250 MG tablet Take 4 tablets (1,000 mg total) by mouth daily. Take on an empty stomach 1 hour before or 2 hours after a meal 120  tablet 0 12/14/2023   amLODipine  (NORVASC ) 10 MG tablet Take 10 mg by mouth daily.   12/14/2023   Ascorbic Acid (VITAMIN C) 1000 MG tablet Take 1,000 mg by mouth daily.   12/08/2023   aspirin EC 81 MG tablet Take 81 mg by mouth daily.   12/14/2023   cholecalciferol (VITAMIN D3) 25 MCG (1000 UNIT) tablet Take 1,000 Units by mouth daily.   12/08/2023   citalopram  (CELEXA ) 40 MG tablet Take 40 mg by mouth daily.     12/14/2023   Cyanocobalamin (B-12) 250 MCG TABS Take 1 tablet by mouth daily.   12/08/2023   furosemide (LASIX) 20 MG tablet Take 20 mg by mouth. Prn   12/14/2023   Magnesium  250 MG TABS Take 250 mg by mouth daily.   12/08/2023   metFORMIN (GLUCOPHAGE-XR) 500 MG 24 hr tablet Take 500 mg by mouth daily with supper.   Morning   MYRBETRIQ 50 MG TB24 tablet Take 50 mg by mouth daily.   12/14/2023   oxymetazoline (AFRIN) 0.05 % nasal spray Place 1 spray into both nostrils 2 (two) times daily.   12/14/2023   Potassium Chloride  ER 20 MEQ TBCR Take 1 tablet by mouth 2 (two) times daily.   12/14/2023   predniSONE  (DELTASONE ) 5 MG tablet Take 1 tablet (5 mg total) by mouth daily with breakfast. 30 tablet 5 12/14/2023   relugolix (ORGOVYX) 120 MG tablet Take 120 mg by mouth daily.   Taking   rosuvastatin (CRESTOR) 5 MG tablet Take 5 mg by mouth daily.   12/14/2023   Turmeric Curcumin 500 MG CAPS Take 500 mg by mouth daily.   12/14/2023   valsartan (DIOVAN) 160 MG tablet Take 160 mg by mouth daily.   12/14/2023   zinc gluconate 50 MG tablet Take 50 mg by mouth daily.   12/08/2023   Melatonin 10 MG CAPS Take 1 capsule by mouth at bedtime. (Patient not taking: Reported on 12/04/2023)   More than a month   Olopatadine  HCl 0.2 % SOLN Place 1 drop into both eyes daily as needed (for allergies).    More than a month   Allergies:  Allergies  Allergen Reactions   Adhesive [Tape] Rash   Latex Rash    Family History  Problem Relation Age of Onset   Anesthesia problems Neg Hx    Hypotension Neg Hx     Malignant hyperthermia Neg Hx    Pseudochol deficiency Neg Hx    Social History:  reports that he has quit smoking. His smoking use included cigarettes. He has a 5 pack-year smoking history. He has quit using smokeless tobacco. He reports that he does not currently use alcohol. He reports that he does not use drugs.  ROS: A complete review of systems was performed.  All systems are negative except for pertinent findings as noted. Review of Systems  All other systems reviewed and are negative.    Physical Exam:  Vital signs in last 24 hours: Temp:  [97.9 F (36.6 C)] 97.9 F (36.6 C) (11/25 0957) Pulse Rate:  [76] 76 (11/25 0957) Resp:  [16] 16 (11/25 0957) BP: (148)/(80) 148/80 (11/25 0957) SpO2:  [95 %] 95 % (11/25 0957) Weight:  [119.3 kg] 119.3 kg (11/25 0947) General:  Alert and oriented, No acute distress HEENT: Normocephalic, atraumatic Neck: No JVD or lymphadenopathy Cardiovascular: Regular rate and rhythm Lungs: Regular rate and effort Abdomen: Soft, nontender, nondistended, no abdominal masses Back: No CVA tenderness Extremities: No edema Neurologic: Grossly intact  Laboratory Data:  Results for orders placed or performed during the hospital encounter of 12/15/23 (from the past 24 hours)  Glucose, capillary     Status: Abnormal   Collection Time: 12/15/23  9:49 AM  Result Value Ref Range   Glucose-Capillary 112 (H) 70 - 99 mg/dL   No results found for this or any previous visit (from the past 240 hours). Creatinine: No results for input(s): CREATININE in the last 168 hours.  Impression/Assessment:  BPH with incomplete emptying and likely overflow incontinence -   Plan:  I discussed with the patient and his wife the nature, potential benefits, risks and alternatives to prostatic urethra lift and possible bladder biopsy, including side effects of the proposed treatment, the likelihood of the patient achieving the goals of the procedure, and any potential  problems that might occur during the procedure or recuperation. We discussed flow versus storage, continued incontinence and/or retention possible. Also discussed we may leave a foley catheter. All questions answered. Patient elects to proceed.    Donnice Brooks 12/15/2023

## 2023-12-15 NOTE — Op Note (Signed)
 Preoperative diagnosis: BPH with obstructive symptomatology. Postoperative diagnosis: Same  Principal procedure: Urolift procedure, with the placement of 5 implants (6 total).  Surgeon: Nieves  Anesthesia: General  Complications: None  Drains: 16 French Silastic Foley catheter, to leg bag.  Estimated blood loss: Less than 25 mL  Indications: Sean Green is a 67 year old male with symptomatic BPH.  He had a weak stream and incomplete emptying and incontinence.  Prior prostate radiation.  Findings: I went ahead and did a formal cystoscopy with the cystoscope and inspected the bladder.  He had submucosal hemorrhage from the bladder neck under the trigone and posterior bladder from prior radiation.  No mucosal lesions.  Moderate bladder trabeculation and a large capacity bladder.     Description of procedure: After consent was obtained patient brought to the operating room.  After adequate anesthesia he was placed in lithotomy position and prepped and draped in the usual sterile fashion.  Timeout was performed to confirm the patient and procedure.  The cystoscope was passed per urethra and the bladder carefully inspected with a 30 degree and 70 degree lens.  I then switched out to the 17 French cystoscope. The cystoscopy bridge was replaced with a UroLift delivery device.The first treatment site was the patient's left side approximately 1.5cm distal to the bladder neck. The distal tip of the delivery device was then angled laterally approximately 20 degrees at this position to compress the lateral lobe. The trigger was pulled, thereby deploying a needle containing the implant through the prostate. The needle was then retracted, allowing one end of the implant to be delivered to the capsular surface of the prostate. The implant was then tensioned to assure capsular seating and removal of slack monofilament. The device was then angled back toward midline and slowly advanced proximally until cystoscopic  verification of the monofilament being centered in the delivery bay. The urethral end piece was then affixed to the monofilament with a final squeeze of the trigger thereby tailoring the size of the implant (#1).  Excess filament was then severed. The delivery device was then re-advanced into the bladder. The same procedure was then repeated on the right side at the bladder neck (#2).  I then placed an implant on the left side delivered just proximal to the verumontanum (#3) and 1 on the right side just proximal to the verumontanum (#4).  There was some pillar we lateral lobe tissue that that was still protruding in the left mid prostatic urethra.  There fourth fifth implant was placed in between the first 2 on the left but this was a misfire and no suture was seen.  The sixth implant was then installed in the device and an implant placed on the left side (#5) with the same technique in between the left bladder neck and left veru implants. A final cystoscopy was conducted first to inspect the location and state of each implant and second, to confirm the presence of a continuous anterior channel was present through the prostatic urethra with irrigation flow turned off.  6 Implants were delivered in total with 5 remaining.  Given his bladder distention and incontinence I thought it might be good to leave a catheter for few days to decompress his bladder.  The scope was removed and an 34 French Foley catheter was placed and hooked to dependent drainage. He was then awakened and taken to the recovery area in stable condition. He tolerated the procedure well.

## 2023-12-15 NOTE — Anesthesia Postprocedure Evaluation (Signed)
 Anesthesia Post Note  Patient: SAJID RUPPERT  Procedure(s) Performed: CYSTOSCOPY WITH INSERTION OF UROLIFT     Patient location during evaluation: PACU Anesthesia Type: General Level of consciousness: awake Pain management: pain level controlled Vital Signs Assessment: post-procedure vital signs reviewed and stable Respiratory status: spontaneous breathing Cardiovascular status: blood pressure returned to baseline Postop Assessment: no apparent nausea or vomiting Anesthetic complications: no   No notable events documented.  Last Vitals:  Vitals:   12/15/23 1400 12/15/23 1410  BP: (!) 159/83 (!) 160/91  Pulse: 77 76  Resp: 19 18  Temp:  36.6 C  SpO2: 95% 90%    Last Pain:  Vitals:   12/15/23 1410  TempSrc:   PainSc: 0-No pain                 Lauraine KATHEE [place]

## 2023-12-16 ENCOUNTER — Encounter (HOSPITAL_COMMUNITY): Payer: Self-pay | Admitting: Urology
